# Patient Record
Sex: Female | Born: 1935 | Race: White | Hispanic: No | Marital: Married | Smoking: Never smoker
Health system: Southern US, Academic
[De-identification: ages and names within clinical notes are randomized; demographics above are authoritative.]

## PROBLEM LIST (undated history)

## (undated) DIAGNOSIS — I2510 Atherosclerotic heart disease of native coronary artery without angina pectoris: Secondary | ICD-10-CM

## (undated) DIAGNOSIS — I10 Essential (primary) hypertension: Secondary | ICD-10-CM

## (undated) DIAGNOSIS — Z951 Presence of aortocoronary bypass graft: Secondary | ICD-10-CM

## (undated) DIAGNOSIS — J4489 Other specified chronic obstructive pulmonary disease: Secondary | ICD-10-CM

## (undated) DIAGNOSIS — J449 Chronic obstructive pulmonary disease, unspecified: Secondary | ICD-10-CM

## (undated) DIAGNOSIS — K219 Gastro-esophageal reflux disease without esophagitis: Secondary | ICD-10-CM

## (undated) DIAGNOSIS — E785 Hyperlipidemia, unspecified: Secondary | ICD-10-CM

## (undated) DIAGNOSIS — Z8679 Personal history of other diseases of the circulatory system: Secondary | ICD-10-CM

## (undated) DIAGNOSIS — M129 Arthropathy, unspecified: Secondary | ICD-10-CM

## (undated) HISTORY — DX: Hyperlipidemia, unspecified: E78.5

## (undated) HISTORY — PX: VAGINAL HYSTERECTOMY: SHX2639

## (undated) HISTORY — DX: Other specified chronic obstructive pulmonary disease: J44.89

## (undated) HISTORY — DX: Gastro-esophageal reflux disease without esophagitis: K21.9

## (undated) HISTORY — DX: Essential (primary) hypertension: I10

## (undated) HISTORY — DX: Personal history of other diseases of the circulatory system: Z86.79

## (undated) HISTORY — PX: REPLACEMENT TOTAL KNEE BILATERAL: SUR1225

## (undated) HISTORY — PX: BACK SURGERY: SHX140

## (undated) HISTORY — DX: Chronic obstructive pulmonary disease, unspecified: J44.9

## (undated) HISTORY — DX: Atherosclerotic heart disease of native coronary artery without angina pectoris: I25.10

## (undated) HISTORY — DX: Presence of aortocoronary bypass graft: Z95.1

---

## 1989-11-13 HISTORY — PX: CARDIAC CATHETERIZATION: SHX172

## 1993-11-20 HISTORY — PX: CARDIAC CATHETERIZATION: SHX172

## 1997-09-20 ENCOUNTER — Ambulatory Visit (HOSPITAL_COMMUNITY): Admission: RE | Admit: 1997-09-20 | Discharge: 1997-09-20 | Payer: Self-pay | Admitting: Cardiology

## 1997-09-20 HISTORY — PX: CARDIAC CATHETERIZATION: SHX172

## 1999-06-12 DIAGNOSIS — Z951 Presence of aortocoronary bypass graft: Secondary | ICD-10-CM

## 1999-06-12 HISTORY — DX: Presence of aortocoronary bypass graft: Z95.1

## 2000-01-25 ENCOUNTER — Inpatient Hospital Stay (HOSPITAL_COMMUNITY): Admission: EM | Admit: 2000-01-25 | Discharge: 2000-01-30 | Payer: Self-pay | Admitting: Emergency Medicine

## 2000-01-25 ENCOUNTER — Encounter: Payer: Self-pay | Admitting: Emergency Medicine

## 2000-01-25 HISTORY — PX: CARDIAC CATHETERIZATION: SHX172

## 2000-01-26 ENCOUNTER — Encounter: Payer: Self-pay | Admitting: Thoracic Surgery (Cardiothoracic Vascular Surgery)

## 2000-01-26 HISTORY — PX: CORONARY ARTERY BYPASS GRAFT: SHX141

## 2000-01-27 ENCOUNTER — Encounter: Payer: Self-pay | Admitting: Thoracic Surgery (Cardiothoracic Vascular Surgery)

## 2000-01-28 ENCOUNTER — Encounter: Payer: Self-pay | Admitting: Thoracic Surgery (Cardiothoracic Vascular Surgery)

## 2000-02-26 ENCOUNTER — Encounter: Payer: Self-pay | Admitting: Thoracic Surgery (Cardiothoracic Vascular Surgery)

## 2000-02-26 ENCOUNTER — Encounter
Admission: RE | Admit: 2000-02-26 | Discharge: 2000-02-26 | Payer: Self-pay | Admitting: Thoracic Surgery (Cardiothoracic Vascular Surgery)

## 2000-03-12 ENCOUNTER — Encounter (HOSPITAL_COMMUNITY): Admission: RE | Admit: 2000-03-12 | Discharge: 2000-06-10 | Payer: Self-pay | Admitting: Cardiology

## 2001-02-27 ENCOUNTER — Encounter: Admission: RE | Admit: 2001-02-27 | Discharge: 2001-03-18 | Payer: Self-pay | Admitting: Family Medicine

## 2001-03-27 ENCOUNTER — Encounter: Payer: Self-pay | Admitting: Family Medicine

## 2001-03-27 ENCOUNTER — Encounter: Admission: RE | Admit: 2001-03-27 | Discharge: 2001-03-27 | Payer: Self-pay | Admitting: Family Medicine

## 2001-04-10 ENCOUNTER — Encounter: Admission: RE | Admit: 2001-04-10 | Discharge: 2001-04-10 | Payer: Self-pay | Admitting: Family Medicine

## 2001-04-10 ENCOUNTER — Encounter: Payer: Self-pay | Admitting: Family Medicine

## 2001-04-24 ENCOUNTER — Encounter: Payer: Self-pay | Admitting: Family Medicine

## 2001-04-24 ENCOUNTER — Encounter: Admission: RE | Admit: 2001-04-24 | Discharge: 2001-04-24 | Payer: Self-pay | Admitting: Family Medicine

## 2001-05-29 ENCOUNTER — Encounter: Admission: RE | Admit: 2001-05-29 | Discharge: 2001-05-29 | Payer: Self-pay | Admitting: Family Medicine

## 2001-05-29 ENCOUNTER — Encounter: Payer: Self-pay | Admitting: Family Medicine

## 2001-07-28 ENCOUNTER — Inpatient Hospital Stay (HOSPITAL_COMMUNITY): Admission: RE | Admit: 2001-07-28 | Discharge: 2001-07-30 | Payer: Self-pay | Admitting: Neurosurgery

## 2001-09-02 ENCOUNTER — Encounter: Admission: RE | Admit: 2001-09-02 | Discharge: 2001-09-02 | Payer: Self-pay | Admitting: Neurosurgery

## 2001-10-06 ENCOUNTER — Inpatient Hospital Stay (HOSPITAL_COMMUNITY): Admission: RE | Admit: 2001-10-06 | Discharge: 2001-10-08 | Payer: Self-pay | Admitting: Orthopedic Surgery

## 2001-10-08 ENCOUNTER — Inpatient Hospital Stay (HOSPITAL_COMMUNITY)
Admission: AD | Admit: 2001-10-08 | Discharge: 2001-10-13 | Payer: Self-pay | Admitting: Physical Medicine & Rehabilitation

## 2001-11-10 ENCOUNTER — Encounter: Admission: RE | Admit: 2001-11-10 | Discharge: 2001-12-05 | Payer: Self-pay | Admitting: Orthopedic Surgery

## 2002-05-13 ENCOUNTER — Ambulatory Visit (HOSPITAL_BASED_OUTPATIENT_CLINIC_OR_DEPARTMENT_OTHER): Admission: RE | Admit: 2002-05-13 | Discharge: 2002-05-13 | Payer: Self-pay | Admitting: Orthopedic Surgery

## 2002-06-10 ENCOUNTER — Encounter: Admission: RE | Admit: 2002-06-10 | Discharge: 2002-08-13 | Payer: Self-pay | Admitting: Orthopedic Surgery

## 2002-09-15 ENCOUNTER — Encounter: Admission: RE | Admit: 2002-09-15 | Discharge: 2002-09-15 | Payer: Self-pay | Admitting: Family Medicine

## 2002-09-15 ENCOUNTER — Encounter: Payer: Self-pay | Admitting: Family Medicine

## 2002-09-30 ENCOUNTER — Ambulatory Visit (HOSPITAL_BASED_OUTPATIENT_CLINIC_OR_DEPARTMENT_OTHER): Admission: RE | Admit: 2002-09-30 | Discharge: 2002-09-30 | Payer: Self-pay | Admitting: Orthopedic Surgery

## 2003-03-08 ENCOUNTER — Encounter: Payer: Self-pay | Admitting: Family Medicine

## 2003-03-08 ENCOUNTER — Ambulatory Visit (HOSPITAL_COMMUNITY): Admission: RE | Admit: 2003-03-08 | Discharge: 2003-03-08 | Payer: Self-pay | Admitting: Family Medicine

## 2003-03-17 ENCOUNTER — Encounter: Admission: RE | Admit: 2003-03-17 | Discharge: 2003-05-20 | Payer: Self-pay | Admitting: Family Medicine

## 2003-04-29 ENCOUNTER — Encounter: Admission: RE | Admit: 2003-04-29 | Discharge: 2003-04-29 | Payer: Self-pay | Admitting: Family Medicine

## 2003-07-22 ENCOUNTER — Encounter: Admission: RE | Admit: 2003-07-22 | Discharge: 2003-07-22 | Payer: Self-pay | Admitting: Neurosurgery

## 2003-08-26 ENCOUNTER — Inpatient Hospital Stay (HOSPITAL_COMMUNITY): Admission: RE | Admit: 2003-08-26 | Discharge: 2003-09-02 | Payer: Self-pay | Admitting: Neurosurgery

## 2004-06-15 IMAGING — CR DG CHEST 2V
2 series · 2 of 2 positions shown · non-contrast
Comparison: 07/24/01.

CLINICAL DATA: Preoperative respiratory exam for cervical spondylosis and radiculopathy. 
 TWO VIEW CHEST

[view not recorded (1 of 2)]
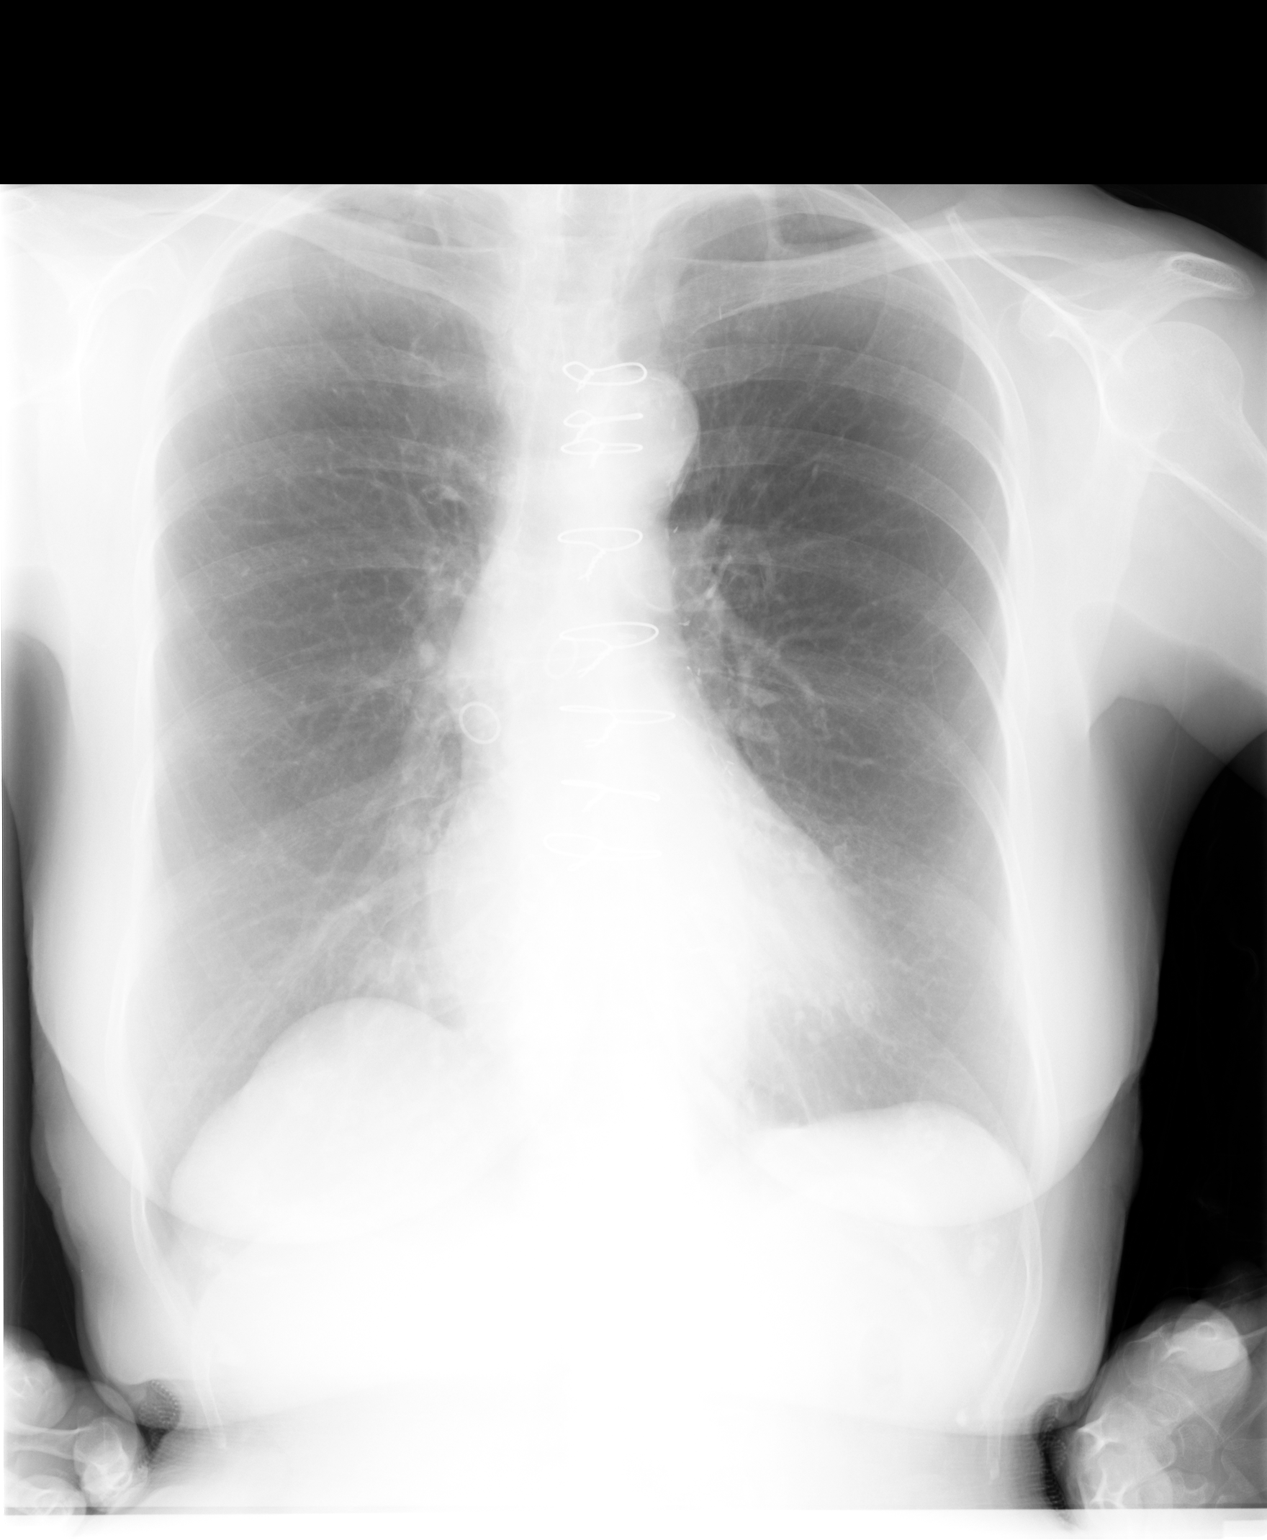

[view not recorded (2 of 2)]
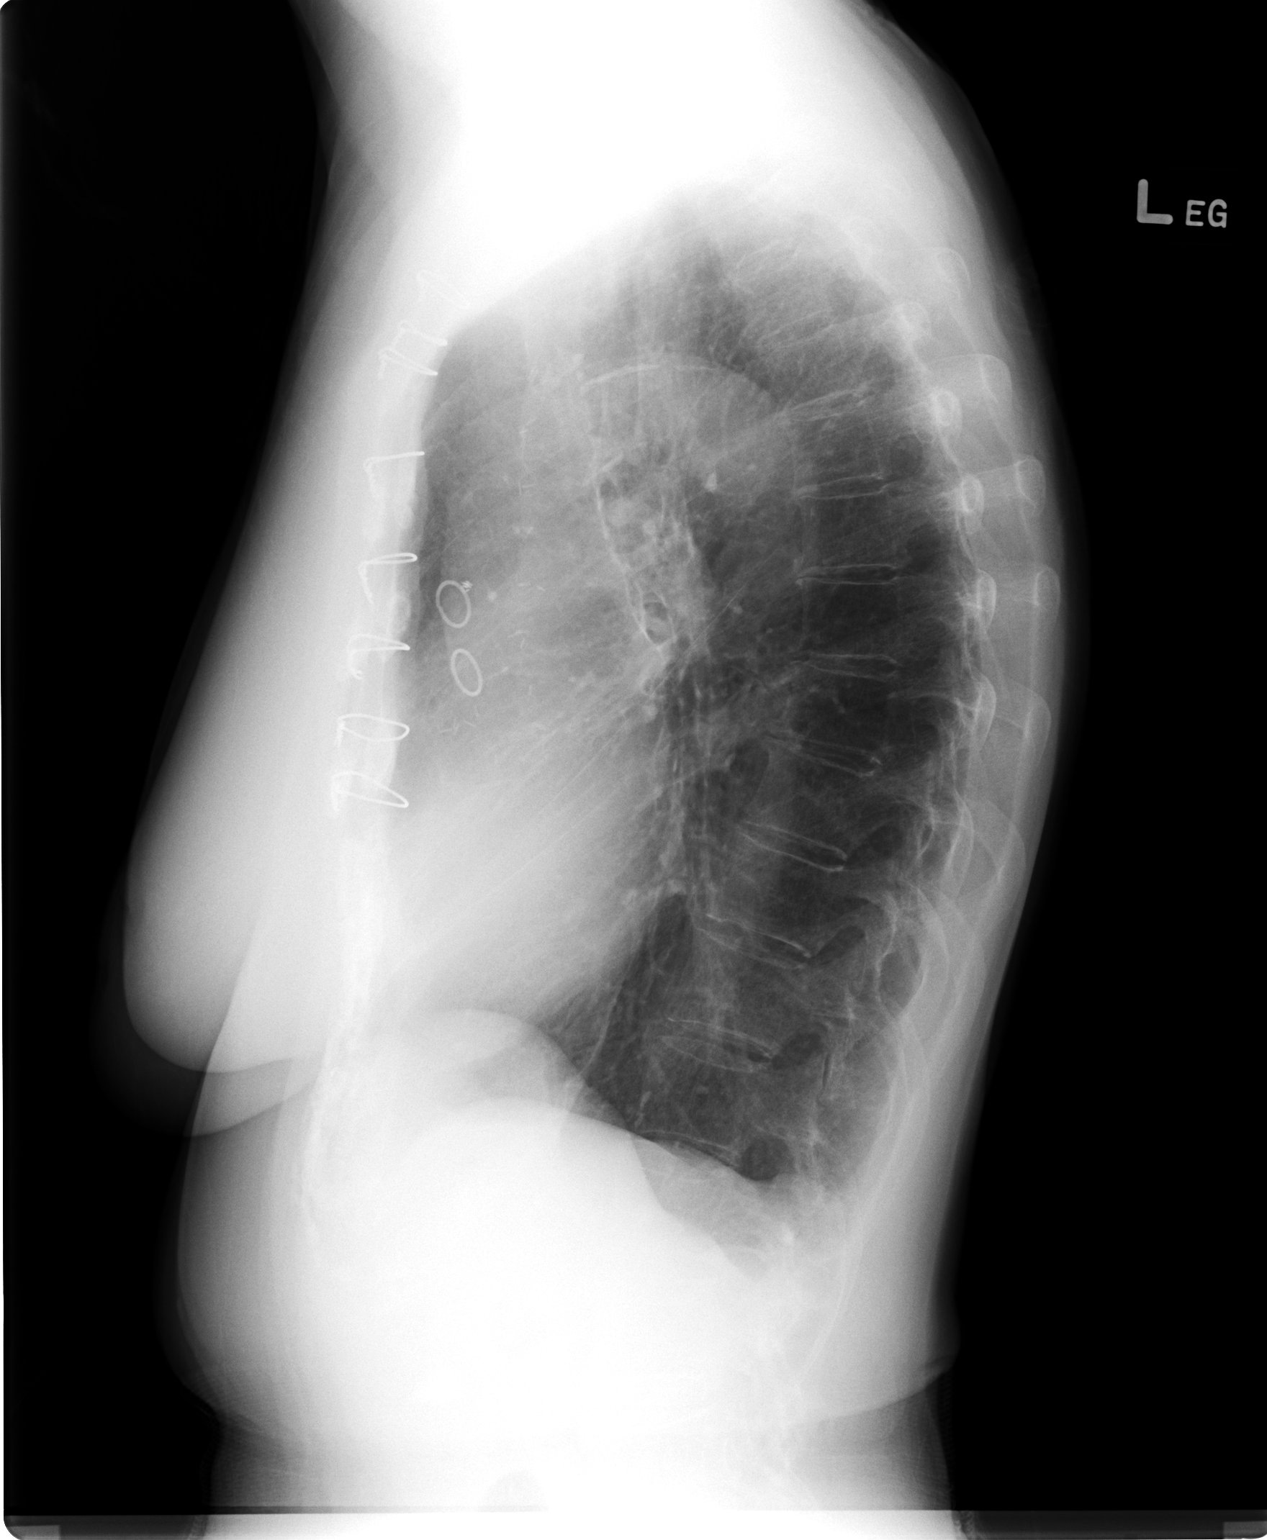

[2 of 2 positions shown; findings below may reference images not displayed]

Stable mild underlying COPD.  No infiltrate, edema, nodule, or pleural effusion.  The heart size is stable.  The patient has had prior CABG.  The bony thorax is unremarkable. 
 IMPRESSION
 No active disease.

## 2004-06-17 IMAGING — RF DG LUMBAR SPINE 2-3V
1 series · 2 of 2 positions shown · non-contrast
Comparison: none

CLINICAL DATA: PLIF L3 to S1.
 INTRAOPERATIVE VIEWS LUMBAR SPINE
 Intraoperative spot image shows skin spreaders posteriorly.  Pedicle screws extend into the L2, L3, L4, L5, and S1 vertebrae, seen in the AP projection.
 INTRAOPERATIVE SPOT IMAGES
 Lateral projection shows pedicle screws extending into the L2, L3, L4, L5, S1 levels.
 LATERAL VIEW LUMBAR SPINE
 The film is suboptimally exposed.  Metallic clips are seen posteriorly.  Superior clip is at the L3 level, the inferior clip is at the 4-5 disc level.
 IMPRESSION 
 As above.
 FLUOROSCOPY
 Fluoroscopy was utilized by the requesting physician.
 IMPRESSION
 No radiographic interpretation.

[Series 1: run · 2 of 2 slices shown]
[im 1/2]
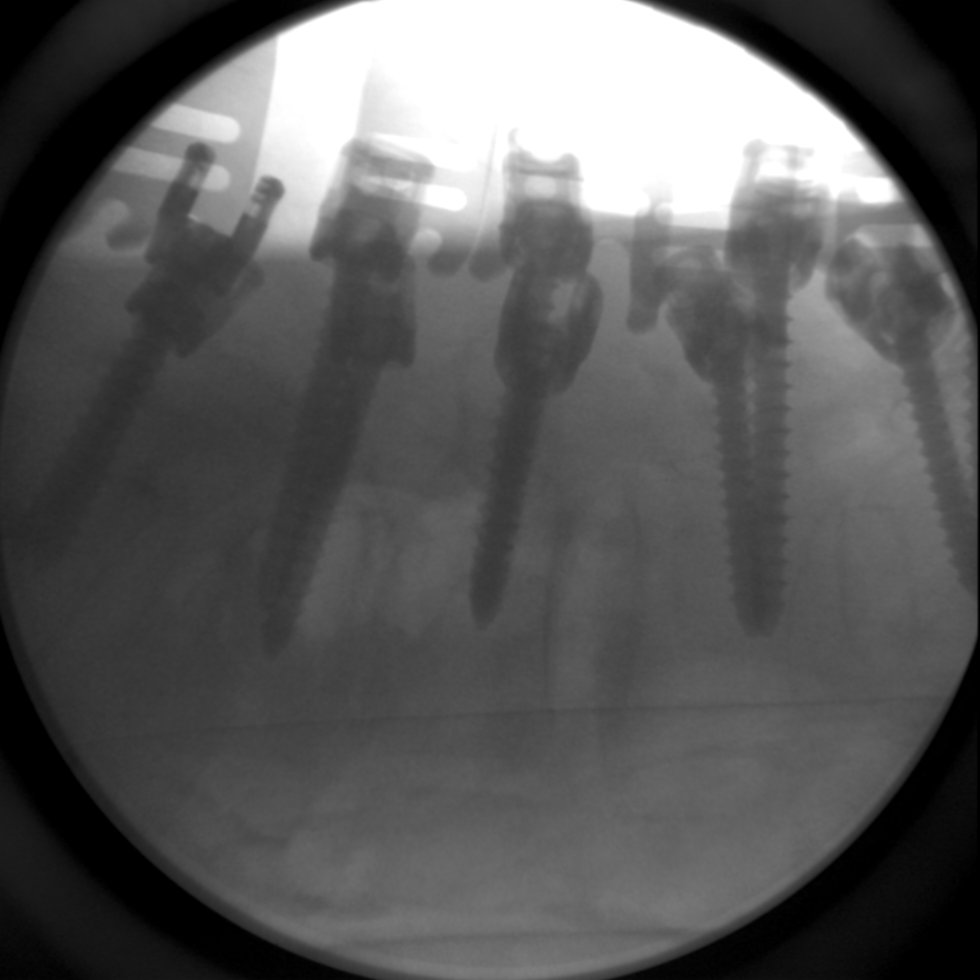
[im 2/2]
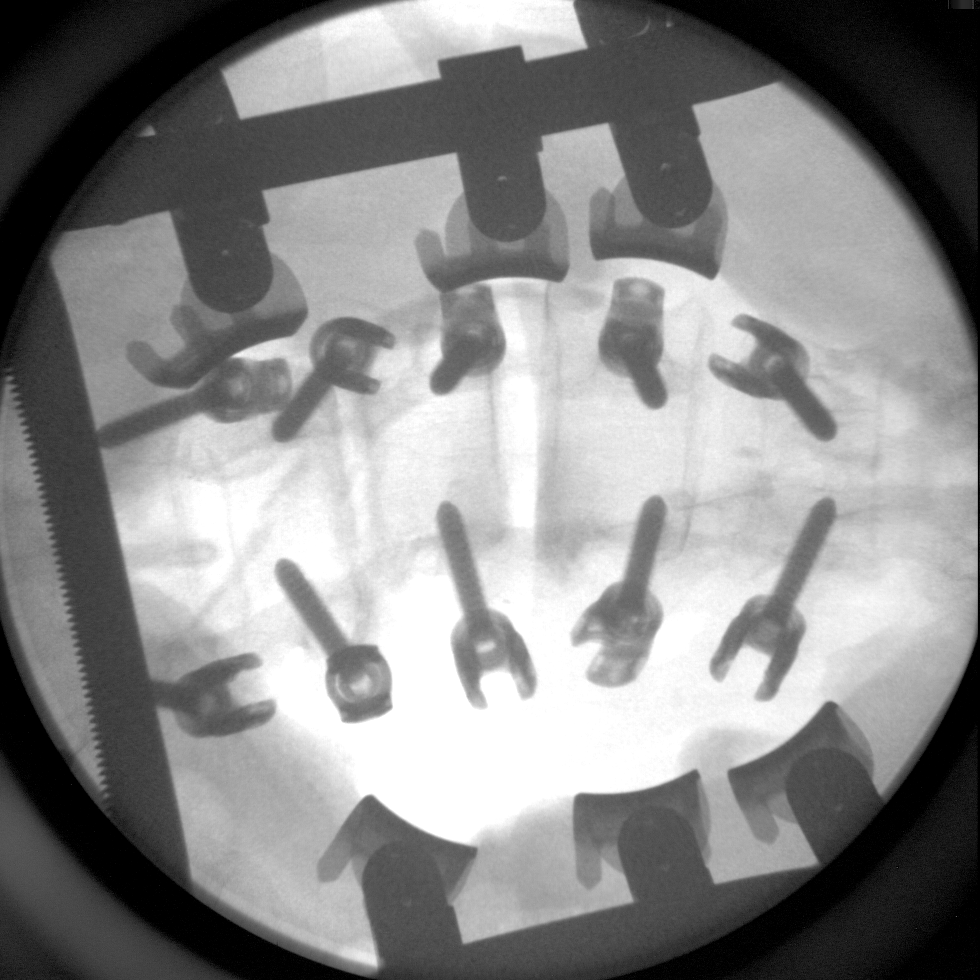

[2 of 2 positions shown; findings below may reference images not displayed]

## 2004-08-17 ENCOUNTER — Encounter: Admission: RE | Admit: 2004-08-17 | Discharge: 2004-08-17 | Payer: Self-pay | Admitting: Cardiology

## 2004-08-24 ENCOUNTER — Ambulatory Visit (HOSPITAL_COMMUNITY): Admission: RE | Admit: 2004-08-24 | Discharge: 2004-08-24 | Payer: Self-pay | Admitting: Cardiology

## 2004-08-24 HISTORY — PX: CARDIAC CATHETERIZATION: SHX172

## 2004-09-04 ENCOUNTER — Encounter: Admission: RE | Admit: 2004-09-04 | Discharge: 2004-09-04 | Payer: Self-pay | Admitting: Cardiology

## 2005-10-09 HISTORY — PX: NM MYOCAR PERF WALL MOTION: HXRAD629

## 2005-11-22 ENCOUNTER — Encounter: Admission: RE | Admit: 2005-11-22 | Discharge: 2005-11-22 | Payer: Self-pay | Admitting: Cardiology

## 2005-11-29 ENCOUNTER — Ambulatory Visit (HOSPITAL_COMMUNITY): Admission: RE | Admit: 2005-11-29 | Discharge: 2005-11-29 | Payer: Self-pay | Admitting: Cardiology

## 2005-11-29 HISTORY — PX: CARDIAC CATHETERIZATION: SHX172

## 2006-07-29 ENCOUNTER — Inpatient Hospital Stay (HOSPITAL_COMMUNITY): Admission: RE | Admit: 2006-07-29 | Discharge: 2006-07-31 | Payer: Self-pay | Admitting: Orthopedic Surgery

## 2006-08-19 ENCOUNTER — Encounter: Admission: RE | Admit: 2006-08-19 | Discharge: 2006-11-17 | Payer: Self-pay | Admitting: Orthopedic Surgery

## 2007-02-27 ENCOUNTER — Ambulatory Visit (HOSPITAL_COMMUNITY): Admission: RE | Admit: 2007-02-27 | Discharge: 2007-02-27 | Payer: Self-pay | Admitting: Cardiology

## 2007-05-05 ENCOUNTER — Ambulatory Visit: Payer: Self-pay | Admitting: Internal Medicine

## 2007-05-16 ENCOUNTER — Ambulatory Visit: Payer: Self-pay | Admitting: Internal Medicine

## 2007-07-03 DIAGNOSIS — J449 Chronic obstructive pulmonary disease, unspecified: Secondary | ICD-10-CM

## 2007-07-04 ENCOUNTER — Ambulatory Visit: Payer: Self-pay | Admitting: Internal Medicine

## 2007-07-06 DIAGNOSIS — I2510 Atherosclerotic heart disease of native coronary artery without angina pectoris: Secondary | ICD-10-CM | POA: Insufficient documentation

## 2007-07-28 ENCOUNTER — Telehealth (INDEPENDENT_AMBULATORY_CARE_PROVIDER_SITE_OTHER): Payer: Self-pay | Admitting: *Deleted

## 2007-10-30 ENCOUNTER — Ambulatory Visit: Payer: Self-pay | Admitting: Internal Medicine

## 2008-04-30 ENCOUNTER — Ambulatory Visit: Payer: Self-pay | Admitting: Internal Medicine

## 2008-04-30 DIAGNOSIS — K219 Gastro-esophageal reflux disease without esophagitis: Secondary | ICD-10-CM | POA: Insufficient documentation

## 2008-08-30 ENCOUNTER — Ambulatory Visit: Payer: Self-pay | Admitting: Internal Medicine

## 2008-09-09 ENCOUNTER — Ambulatory Visit: Payer: Self-pay | Admitting: Internal Medicine

## 2008-11-23 ENCOUNTER — Ambulatory Visit: Payer: Self-pay | Admitting: Internal Medicine

## 2008-12-03 ENCOUNTER — Telehealth: Payer: Self-pay | Admitting: Internal Medicine

## 2009-03-03 ENCOUNTER — Encounter: Payer: Self-pay | Admitting: Internal Medicine

## 2009-03-24 ENCOUNTER — Ambulatory Visit: Payer: Self-pay | Admitting: Internal Medicine

## 2009-09-20 ENCOUNTER — Ambulatory Visit: Payer: Self-pay | Admitting: Internal Medicine

## 2010-02-16 ENCOUNTER — Encounter: Payer: Self-pay | Admitting: Internal Medicine

## 2010-02-20 ENCOUNTER — Encounter: Admission: RE | Admit: 2010-02-20 | Discharge: 2010-02-20 | Payer: Self-pay | Admitting: Cardiology

## 2010-02-20 ENCOUNTER — Encounter: Payer: Self-pay | Admitting: Internal Medicine

## 2010-03-21 ENCOUNTER — Ambulatory Visit: Payer: Self-pay | Admitting: Internal Medicine

## 2010-05-12 ENCOUNTER — Ambulatory Visit (HOSPITAL_COMMUNITY)
Admission: RE | Admit: 2010-05-12 | Discharge: 2010-05-12 | Payer: Self-pay | Source: Home / Self Care | Admitting: Gastroenterology

## 2010-07-13 NOTE — Assessment & Plan Note (Signed)
Summary: rov 6 months///kp   Copy to:  Al. Little Primary Provider/Referring Provider:  Arrie Aran  CC:  Follow up visit.  History of Present Illness:  08/30/08- COPD Coughs alot on arising in AM. Was wheezing and her cardiologist gave her Ventolin HFA which helps her cough some. Square dances on Monday nights and this or fast walking make her SOB. Usually she can dance as much as she wants. PFT 12/08 showed obstruction in small airways. Dry cough only. Denies chest pain, palitation or leg edema. Spiriva overdried her mouth.  11/23/08- COPD Summer heat bothers her breathing some. No recent infection. Frequent dry cough, esp in AM but doesn't wake her. Stays dyspneic walking hills but walks her dog and still goes square dancing. Asks Rx for  cough. Spiriva overdried.  CXR- COPD, basilar scarring, NAD. PFT- mild small airway obstruction without response to dilator, diffusinon mildly reduced.  March 24, 2009- COPD  using Advair but rarely needs rescue inhaler. Dry cough on waking for an hour or so. Sleep not disturbed. Some dyspnea with exertion. Takes square dancing once a week.  September 20, 2009- COPD Spring isn't bothering her breathing yet. Continues Advair but rarely needs her rescue inhaler. Room air sat today is 93%. Has daughter with 40 pack year smoking hx now dx'd with COPD. We reviewed her PFT which fits mild emphysema. Little cough, rarely productive. Known hx of CAD.   Current Medications (verified): 1)  Vytorin 10-40 Mg Tabs (Ezetimibe-Simvastatin) .... Take 1 By Mouth Once Daily 2)  Synthroid 125 Mcg  Tabs (Levothyroxine Sodium) .... Take 1 Tablet By Mouth Once A Day 3)  Ranexa 500 Mg  Tb12 (Ranolazine) .Marland Kitchen.. 1 By Mouth Two Times A Day 4)  Glucosamine Chondr 1500 Complx   Caps (Glucosamine-Chondroit-Vit C-Mn) .... Take 1 By Mouth Once Daily 5)  Centrum Silver   Tabs (Multiple Vitamins-Minerals) .... Take 1 By Mouth Once Daily 6)  Ocuvite-Lutein   Caps (Multiple  Vitamins-Minerals) .... Take 2 By Mouth Once Daily 7)  Adult Aspirin Low Strength 81 Mg  Tbdp (Aspirin) .... Take 1 By Mouth Once Daily 8)  Prilosec Otc 20 Mg Tbec (Omeprazole Magnesium) .Marland Kitchen.. 1 Daily Before Supper 9)  Ventolin Hfa 108 (90 Base) Mcg/act Aers (Albuterol Sulfate) .... 2 Puffs Four Times A Day As Needed 10)  Benzonatate 100 Mg Caps (Benzonatate) .Marland Kitchen.. 1 Four Times A Day As Needed Cough 11)  Advair Diskus 100-50 Mcg/dose Misc (Fluticasone-Salmeterol) .Marland Kitchen.. 1 Puff Two Times A Day and Rinse Mouth  Allergies (verified): 1)  ! Penicillin 2)  ! Sulfa  Past History:  Past Medical History: Last updated: 04/30/2008 Coronary Heart Disease/ angina/ CABG  COPD (ICD-496) GERD (ICD-530.81)  Past Surgical History: Last updated: 07/04/2007 CABG Nov 06, 1999 back sgy hysterectomy bilateral knee replacement  Family History: Last updated: 09/20/2009 Brother with COPD with COPD mother died in 1991-11-06 from heart failure father died in 11-05-53 from heart problems 3 siblings 1 brother alive age 14  hx of MI 1 sister alive age 21  hx of MI i brother died age 73 from heart attack Daughter COPD, smoked.  Social History: Last updated: 11/12/2007 Patient states former smoker.-quit 11/06/1999 retired widowed 3 children etoh--1 drink per week  Risk Factors: Smoking Status: quit (07/04/2007)  Family History: Brother with COPD with COPD mother died in 1991-11-06 from heart failure father died in 11-05-53 from heart problems 3 siblings 1 brother alive age 24  hx of MI 1 sister alive age 59  hx of  MI i brother died age 60 from heart attack Daughter COPD, smoked.  Review of Systems      See HPI       The patient complains of dyspnea on exertion.  The patient denies anorexia, fever, weight loss, weight gain, vision loss, decreased hearing, hoarseness, chest pain, syncope, peripheral edema, prolonged cough, headaches, hemoptysis, and severe indigestion/heartburn.    Vital Signs:  Patient profile:   77  year old female Height:      60 inches Weight:      104 pounds BMI:     20.38 O2 Sat:      93 % on Room air Pulse rate:   59 / minute BP sitting:   118 / 66  (left arm) Cuff size:   regular  Vitals Entered By: Reynaldo Minium CMA (September 20, 2009 2:52 PM)  O2 Flow:  Room air  Physical Exam  Additional Exam:  General: A/Ox3; pleasant and cooperative, NAD, SKIN: no rash, lesions NODES: no lymphadenopathy HEENT: [place]/AT, EOM- WNL, Conjuctivae- clear, PERRLA, TM-WNL, Nose- clear, Throat- clear and wnl NECK: Supple w/ fair ROM, JVD- bilaterally, normal carotid impulses w/o bruits Thyroid- normal to palpation CHEST: Distant, unlabored, diminished,  HEART: RRR, slow, no m/g/r heard ABDOMEN: Soft and nl; HKV:QQVZ, nl pulses, no edema  NEURO: Grossly intact to observation      Impression & Recommendations:  Problem # 1:  COPD (ICD-496) Well controlled emphysema pattern. Extensive smoking hx throughout family. No early onset lung disease to raise concern for a1AT. She is encouraged to continue square dancing for exercise.  Medications Added to Medication List This Visit: 1)  Vytorin 10-40 Mg Tabs (Ezetimibe-simvastatin) .... Take 1 by mouth once daily  Other Orders: Est. Patient Level III (56387)  Patient Instructions: 1)  Please schedule a follow-up appointment in 6 months. 2)  Call sooner if needed

## 2010-07-13 NOTE — Letter (Signed)
Summary: [facility]  [facility]   Imported By: Sherian Rein 03/17/2010 14:22:15  _____________________________________________________________________  External Attachment:    Type:   Image     Comment:   External Document

## 2010-07-13 NOTE — Assessment & Plan Note (Signed)
Summary: rov 6 months///kp   Copy to:  Al. Little Primary Provider/Referring Provider:  [facility] Francesco Runner  CC:  6 month COPD follow up.  Pt states breathing is doing well overall.  Occ nonprod cough.  Denies wheezing, chest tightness, and SOB.  [name]  History of Present Illness: 11/23/08- COPD Summer heat bothers her breathing some. No recent infection. Frequent dry cough, esp in AM but doesn't wake her. Stays dyspneic walking hills but walks her dog and still goes square dancing. Asks Rx for  cough. Spiriva overdried.  CXR- COPD, basilar scarring, NAD. PFT- mild small airway obstruction without response to dilator, diffusinon mildly reduced.  March 24, 2009- COPD  using Advair but rarely needs rescue inhaler. Dry cough on waking for an hour or so. Sleep not disturbed. Some dyspnea with exertion. Takes square dancing once a week.  September 20, 2009- COPD Spring isn't bothering her breathing yet. Continues Advair but rarely needs her rescue inhaler. Room air sat today is 93%. Has daughter with 40 pack year smoking hx now dx'd with COPD. We reviewed her PFT which fits mild emphysema. Little cough, rarely productive. Known hx of CAD.  March 21, 2010- COPD, CAD cc: Nurse- 6 month COPD follow up.  Pt states breathing is doing well overall.  Occ nonprod cough.  Denies wheezing, chest tightness, SOB.  Cooler weather is not bothering her.  She rarely coughs and denies wheeze. Discussed cardiac hx. Occasional random pains across lower rib edges, nonexertional, but no angina or palpitation. Has not needed to use Ventolin.  Had pneumovax here several years ago,    Preventive Screening-Counseling & Management  Alcohol-Tobacco     Smoking Status: quit     Packs/Day: 2.0     Year Started: 10-23-73     Year Quit: 10/24/1999     Pack years: 63  Current Medications (verified): 1)  Vytorin 10-40 Mg Tabs (Ezetimibe-Simvastatin) .... Take 1 By Mouth Once Daily 2)  Synthroid 125 Mcg  Tabs (Levothyroxine  Sodium) .... Take 1 Tablet By Mouth Once A Day 3)  Glucosamine Chondr 1500 Complx   Caps (Glucosamine-Chondroit-Vit C-Mn) .... Take 1 By Mouth Once Daily 4)  Centrum Silver   Tabs (Multiple Vitamins-Minerals) .... Take 1 By Mouth Once Daily 5)  Ocuvite-Lutein   Caps (Multiple Vitamins-Minerals) .... Take 2 By Mouth Once Daily 6)  Adult Aspirin Low Strength 81 Mg  Tbdp (Aspirin) .... Take 1 By Mouth Once Daily 7)  Ventolin Hfa 108 (90 Base) Mcg/act Aers (Albuterol Sulfate) .... 2 Puffs Four Times A Day As Needed 8)  Benzonatate 100 Mg Caps (Benzonatate) .[name].. 1 Four Times A Day As Needed Cough 9)  Advair Diskus 100-50 Mcg/dose Misc (Fluticasone-Salmeterol) .[name].. 1 Puff Two Times A Day and Rinse Mouth  Allergies (verified): 1)  ! Penicillin 2)  ! Sulfa 3)  Codeine  Past History:  Past Medical History: Last updated: 04/30/2008 Coronary Heart Disease/ angina/ CABG  COPD (ICD-496) GERD (ICD-530.81)  Family History: Last updated: 03/21/2010 Brother with COPD Sister with COPD mother died in Oct 24, 1991 from heart failure father died in Oct 23, 1953 from heart problems 3 siblings 1 brother alive age 55  hx of MI 1 sister alive age 33  hx of MI i brother died age 89 from heart attack Daughter COPD, smoked.  Social History: Last updated: 03/21/2010 Patient states former smoker.-quit Oct 24, 1999.  2 ppd x 25 yrs. retired widowed 3 children etoh--1 drink per week  Risk Factors: Smoking Status: quit (03/21/2010) Packs/Day: 2.0 (03/21/2010)  Past Surgical History: CABG 4v 2001 back sgy hysterectomy bilateral knee replacement  Family History: Brother with COPD Sister with COPD mother died in 85 from heart failure father died in 85 from heart problems 3 siblings 1 brother alive age 20  hx of MI 1 sister alive age 60  hx of MI i brother died age 31 from heart attack Daughter COPD, smoked.  Social History: Patient states former smoker.-quit 2001.  2 ppd x 25 yrs. retired widowed 3  children etoh--1 drink per week Packs/Day:  2.0 Pack years:  50  Review of Systems      See HPI       The patient complains of shortness of breath with activity.  The patient denies shortness of breath at rest, productive cough, non-productive cough, coughing up blood, chest pain, irregular heartbeats, acid heartburn, indigestion, loss of appetite, weight change, abdominal pain, difficulty swallowing, sore throat, tooth/dental problems, headaches, nasal congestion/difficulty breathing through nose, sneezing, itching, hand/feet swelling, and rash.    Vital Signs:  Patient profile:   77 year old female Height:      60 inches Weight:      101.25 pounds BMI:     19.85 O2 Sat:      94 % on Room air Pulse rate:   65 / minute BP sitting:   128 / 66  (right arm) Cuff size:   regular  Vitals Entered By: Gweneth Dimitri RN (March 21, 2010 2:03 PM)  O2 Flow:  Room air CC: 6 month COPD follow up.  Pt states breathing is doing well overall.  Occ nonprod cough.  Denies wheezing, chest tightness, SOB.   Comments Medications reviewed with patient Daytime contact number verified with patient. Gweneth Dimitri RN  March 21, 2010 2:03 PM    Physical Exam  Additional Exam:  General: A/Ox3; pleasant and cooperative, NAD, SKIN: no rash, lesions NODES: no lymphadenopathy HEENT: [place]/AT, EOM- WNL, Conjuctivae- clear, PERRLA, TM-WNL, Nose- clear, Throat- clear and wnl NECK: Supple w/ fair ROM, JVD- bilaterally, normal carotid impulses w/o bruits Thyroid- normal to palpation CHEST: Distant, unlabored, diminished,  HEART: RRR, slow, no m/g/r heard ABDOMEN: Soft and nl; UVO:ZDGU, nl pulses, no edema  NEURO: Grossly intact to observation      Impression & Recommendations:  Problem # 1:  COPD (ICD-496) Clinically stable COPD. She does square dancing which is excellent for mental status and exercise. Discussed flu and pneumovax. Will give flu vax this year.  Dr Clarene Duke got a CXR "ok" last month- we  will get that report. PFT in 2010 showed mild obstruction and DLCO reduction.   Problem # 2:  CORONARY HEART DISEASE (ICD-414.00) Following with Dr Clarene Duke. She reports no new problems. The following medications were removed from the medication list:    Ranexa 500 Mg Tb12 (Ranolazine) .[name]... 1 by mouth two times a day Her updated medication list for this problem includes:    Adult Aspirin Low Strength 81 Mg Tbdp (Aspirin) .[name]... Take 1 by mouth once daily  Problem # 3:  GERD (ICD-530.81) We reviewed reflux precautions. The following medications were removed from the medication list:    Prilosec Otc 20 Mg Tbec (Omeprazole magnesium) .[name]... 1 daily before supper  Other Orders: Est. Patient Level IV (44034) Flu Vaccine 54yrs + MEDICARE PATIENTS (V4259) Administration Flu vaccine - MCR (D6387)  Patient Instructions: 1)  Please schedule a follow-up appointment in 6 months. 2)  We will get result of CXR from last month for your record 3)  Flu vax 4)  cc Dr Clarene Duke           Flu Vaccine Consent Questions     Do you have a history of severe allergic reactions to this vaccine? no    Any prior history of allergic reactions to egg and/or gelatin? no    Do you have a sensitivity to the preservative Thimersol? no    Do you have a past history of Guillan-Barre Syndrome? no    Do you currently have an acute febrile illness? no    Have you ever had a severe reaction to latex? no    Vaccine information given and explained to patient? yes    Are you currently pregnant? no    Lot Number:[id number]   Exp Date:12/09/2010   Site Given  right Deltoid IMflu  Carver Fila  March 21, 2010 2:59 PM

## 2010-09-18 ENCOUNTER — Encounter: Payer: Self-pay | Admitting: Internal Medicine

## 2010-09-19 ENCOUNTER — Encounter: Payer: Self-pay | Admitting: Internal Medicine

## 2010-09-19 ENCOUNTER — Telehealth: Payer: Self-pay | Admitting: Pulmonary Disease

## 2010-09-19 ENCOUNTER — Ambulatory Visit (INDEPENDENT_AMBULATORY_CARE_PROVIDER_SITE_OTHER): Payer: Medicare Other | Admitting: Internal Medicine

## 2010-09-19 VITALS — BP 120/78 | HR 61 | Ht 60.0 in | Wt 102.0 lb

## 2010-09-19 DIAGNOSIS — J449 Chronic obstructive pulmonary disease, unspecified: Secondary | ICD-10-CM

## 2010-09-19 NOTE — Patient Instructions (Signed)
Continue present meds. You seem to be doing very well, and I am particularly pleased with the regular dancing.  Please call as needed.

## 2010-09-19 NOTE — Progress Notes (Signed)
  Subjective:    Patient ID: [name], female    DOB: 10/28/1924, 77 y.o.   MRN: [id number]  HPI 53 yoF former smoker followed for COPD with hx CAD and GERD. Last here March 21, 2010 and got through winter without bad problems. Denies any symptoms related to the current pollen season. She comments that she only notes dyspnea if she is square dancing each week. She does not see a change in exercise tolerance there. Occasional dry morning cough. Denies fever, sweats, glands, chest pain or palpitation. She continues Advair 100 twice daily and almost never needs her rescue inhaler.    Review of Systems See HPI   Constitutional:   No weight loss, night sweats,  Fevers, chills, fatigue, lassitude. HEENT:   No headaches,  Difficulty swallowing,  Tooth/dental problems,  Sore throat,                No sneezing, itching, ear ache, nasal congestion, post nasal drip,   CV:  No chest pain,  Orthopnea, PND, swelling in lower extremities, anasarca, dizziness, palpitations  GI  No heartburn, indigestion, abdominal pain, nausea, vomiting, diarrhea, change in bowel habits, loss of appetite  Resp:  shortness of breath with exertion, not at rest.  No excess mucus, no productive cough,  No non-productive cough,  No coughing up of blood.  No change in color of mucus.  No wheezing.  No chest wall deformity  Skin: no rash or lesions.  GU: no dysuria, change in color of urine, no urgency or frequency.  No flank pain.  MS:  No joint pain or swelling.  No decreased range of motion.  No back pain.  Psych:  No change in mood or affect. No depression or anxiety.  No memory loss.   Objective:   Physical Exam General- Alert, Oriented, Affect-appropriate, Distress- none acute. Slim elderly woman.  Skin- rash-none, lesions- none, excoriation- none  Lymphadenopathy- none  Head- atraumatic  Eyes- Gross vision intact, PERRLA, conjunctivae clear secretions  Ears-  Normal- Hearing, canals Tm L ,   R  ,  Nose- Clear, No- Septal dev, mucus, polyps, erosion, perforation   Throat- Mallampati II , mucosa clear , drainage- none, tonsils- atrophic  Neck- flexible , trachea midline, no stridor , thyroid nl, carotid no bruit  Chest - symmetrical excursion , unlabored     Heart/CV- RRR , no murmur , no gallop  , no rub, nl s1 s2                     - JVD- none , edema- none, stasis changes- none, varices- none     Lung- clear to P&A, wheeze- none, cough- none , dullness-none, rub- none     Chest wall-  Abd- tender-no, distended-no, bowel sounds-present, HSM- no  Br/ Gen/ Rectal- Not done, not indicated  Extrem- cyanosis- none, clubbing, none, atrophy- none, strength- nl  Neuro- grossly intact to observation         Assessment & Plan:

## 2010-09-19 NOTE — Telephone Encounter (Signed)
Error. No msg needed. Kathleen Perdue  °

## 2010-09-19 NOTE — Assessment & Plan Note (Signed)
I am pleased with clinical stability and with her regular dancing for exercise. She had a CXR last Fall and i hear no reason to repeat at this time, or to change meds.

## 2010-09-22 ENCOUNTER — Encounter: Payer: Self-pay | Admitting: Internal Medicine

## 2010-10-24 NOTE — Assessment & Plan Note (Signed)
[facility]                             PULMONARY OFFICE NOTE   NAME:[name], [name]                    MRN:          [id number]  DATE:05/05/2007                            DOB:          [birth year]    PROBLEM:  This is an 77 year old woman seen in pulmonary consultation at  the kind request of Dr. Clarene Duke for history of chronic obstructive  pulmonary disease, complaining that she does not breathe well.   HISTORY:  Her sister had been a patient of mine. [name] says that  she was diagnosed with chronic obstructive pulmonary disease two months  ago having quit smoking in 2001. A pulmonary function test at [facility]  on 02/27/2007 showed normal airflow with response to bronchodilator.  Baseline FEV1/FVC was 66%. Small airway flows were at 33% of predicted  improving significantly to 57% of predicted after bronchodilator as the  basis for an interpretation of mild obstructive airways disease,  primarily involving small airways, but with some response to  bronchodilator even in the larger airway flows. Diffusion and lung  volumes were not measured. She had a pneumonia treated as an outpatient  in 2006. She notices mostly exertional dyspnea, especially in the last 6  to 7 months, and especially climbing hills or stairs. There has been a  [name] cough. She considers that she gets colds and respiratory  infections only rarely. It is not clear when she last had a chest x-ray.   MEDICATIONS:  1. Lipitor 40 mg.  2. Synthroid.  3. Toprol.  4. Xopenex HFA inhaler 2 puffs t.i.d. p.r.n.   Drug intolerant PENICILLIN and SULFA.   REVIEW OF SYSTEMS:  Weight has been stable. Exertional dyspnea and non-  productive cough per HPI. Nasal congestion. Joint stiffness. She says  that her legs get stiff and it is hard to move from a sitting position  sometimes, but she denies claudication, exertional chest pain,  palpitation, productive cough, fever, blood, or  swollen glands.   PAST HISTORY:  Angina and coronary artery disease with bypass graft in  2001. Hyperlipidemia. She denies history of asthma or seasonal allergic  rhinitis. She has had back surgery, hysterectomy, bilateral knee  replacements.   SOCIAL HISTORY:  She quit smoking in 2001. She is widowed with three  children. Her sister had chronic obstructive pulmonary disease. She  lives alone. She is retired from the [employer]. She  recently went on a cruise through [place].   FAMILY HISTORY:  Chronic obstructive pulmonary disease, heart disease.   OBJECTIVE:  Weight 114 pounds, blood pressure 116/78, pulse regular 78,  room air saturation 97%. Eyes, nose, and throat are clear. Neck veins  are not distended. Voice quality is normal without strider.  CHEST:  Diminished breath sounds with trace and expiratory wheeze  bilaterally. No rales or rhonchi, and no coughing here.  HEART:  Sounds are regular. I do not hear a murmur or gallop.  ABDOMEN:  Soft without hepatosplenomegaly.  EXTREMITIES:  Without cyanosis, clubbing, or edema.   VACCINE STATUS:  She has avoided flu vaccine in the past,  but agreed to  try it this year, along with pneumococcal vaccine after discussion.   IMPRESSION:  1. Dyspnea, relatively recently noted by her description and primarily      associated with exertion. There is a significant smoking history.      PFTs show mild partly reversible airway obstruction, particularly      in small airway flows. We will want to reassess that for stability      and to see how closely it represents her baseline status.  2. Atherosclerosis with history of coronary disease, bypass graft, and      at least some degree of peripheral vascular disease.   PLAN:  1. Pulmonary function test with spirometry and six minute walk test.  2. Chest x-ray.  3. Flu and pneumococcal vaccines.  4. She will continue using Xopenex on a p.r.n. rescue inhaler basis as       discussed with her, scheduling return in two months or earlier      p.r.n.   I appreciate the chance to meet her.     Clinton D. Maple Hudson, MD, Tonny Bollman, FACP  Electronically Signed    CDY/MedQ  DD: 05/10/2007  DT: 05/11/2007  Job #: [id number]   cc:   [name], M.D.  [name], M.D.

## 2010-10-27 NOTE — Discharge Summary (Signed)
[place]. [facility]  Patient:    [name], [name] Visit Number: [id number] MRN: [id number]          Service Type: [facility] Location: [id number] Attending Physician:  Herold Harms Dictated by:   Mcarthur Rossetti. Angiulli, P.A. Admit Date:  10/08/2001 Discharge Date: 10/13/2001   CC:         Georgena Spurling, M.D.  Lacretia Leigh. Quintella Reichert, M.D.  Julieanne Manson, M.D.   Discharge Summary  DISCHARGE DIAGNOSES: 1. Left total knee arthroplasty secondary to degenerative joint disease    on October 06, 2001. 2. Anemia. 3. Coronary artery disease with coronary artery bypass grafting. 4. Back surgery. 5. Hypercholesterolemia.  HISTORY OF PRESENT ILLNESS: This is a 77 year old white female with a history of hypertension, coronary artery disease, admitted on October 06, 2001 with progressive left knee pain times six years. X-rays with severe medial compartment collapse. No relief with conservative care. Underwent left total knee arthroplasty on October 06, 2001 by Dr. Sherlean Foot. Placed on subcutaneous Lovenox for deep vein thrombosis prophylaxis and weight bearing as tolerated. Postoperative anemia and transfused. No chest pain or shortness of breath. Minimal assistance for ambulation. Admitted for a comprehensive rehab program.  PAST MEDICAL HISTORY: See discharge diagnoses.  PAST SURGICAL HISTORY: Cervical disk surgery 20 years ago. Lumbar surgery. Partial hysterectomy.  ALLERGIES: PENICILLIN AND SULFA.  PRIMARY CARE PHYSICIAN: [name] Rossetti, M.D.  CARDIOLOGIST: Julieanne Manson, M.D.  SOCIAL HISTORY: Remote smoker. No alcohol.  MEDICATIONS: 1. Lipitor 10 mg daily. 2. Mobic 15 mg one half tablet daily. 3. Acuvite daily. 4. Aspirin daily.  FAMILY HISTORY: Recently widowed. Lives alone. One level home. One step to entry. Retired [employer] operational officer. No local family.  HOSPITAL COURSE: The patient did well while on rehabilitation services with therapies initiated on a b.i.d.  basis. The following issues are followed during the patients rehab course. Pertaining to Ms. Rathe left total knee arthroplasty, remained stable. Surgical site healing nicely. She would follow-up with Dr. Sherlean Foot in one week for removal of staples. She will still need an advancement in her CPM machine. This would be arranged with home CPM machine. It was advised the need for home health therapies and follow-up again with Dr. Sherlean Foot with total knee precautions. She was maintained on SQ Lovenox throughout her rehab stay. Venous Doppler studies prior to her discharge were negative. Lovenox was discontinued and aspirin resumed for her history of coronary artery disease. Postoperative anemia with transfusion. Latest hemoglobin 10.5 and hematocrit 30.4. She continued on iron supplement. She had no chest pain or shortness of breath. Blood pressures remained controlled without the use of medications. She had no voiding difficulty and bowel program was regulated with laxative assistance. Overall for her functional mobility, she was ambulating extended distances with a walker, independent in her room. Home health therapies had been arranged. She would follow-up with orthopaedic services.  LABORATORY DATA: Latest labs showed a sodium of 144, potassium 3.7, BUN 15, creatinine 0.7, hemoglobin 10.5, hematocrit 30.4.  DISCHARGE MEDICATIONS: 1. Lipitor 10 mg daily. 2. Acuvite daily. 3. Trinsicon b.i.d. 4. Vioxx 25 mg daily. 5. Aspirin daily. 6. Tylox as needed for pain.  ACTIVITY: Weight bearing as tolerated with total knee precautions and CPM machine for home.  DIET: Regular.  WOUND CARE: Follow-up with Dr. Sherlean Foot in one week for removal of staples. Follow-up with Dr. [name] Rossetti for medical management. Dictated by:   Mcarthur Rossetti. Angiulli, P.A. Attending Physician:  Herold Harms DD:  10/13/01 TD:  10/15/01 Job: [id number]

## 2010-10-27 NOTE — Op Note (Signed)
NAME:  [name], [name]                       ACCOUNT NO.:  [id number]   MEDICAL RECORD NO.:  [id number]                   PATIENT TYPE:  AMB   LOCATION:  DSC                                  FACILITY:  [facility]   PHYSICIAN:  Artist Pais. Mina Marble, M.D.           DATE OF BIRTH:  05/24/1925   DATE OF PROCEDURE:  09/30/2002  DATE OF DISCHARGE:                                 OPERATIVE REPORT   PREOPERATIVE DIAGNOSIS:  Left carpal tunnel syndrome with thenar atrophy.   POSTOPERATIVE DIAGNOSIS:  Left carpal tunnel syndrome with thenar atrophy.   PROCEDURE:  Left carpal tunnel release with Camitz tendon transfer.   SURGEON:  Artist Pais. Mina Marble, M.D.   ASSISTANT:  Aura Fey. Bobbe Medico.   ANESTHESIA:  General.   TOURNIQUET TIME:  40 minutes.   COMPLICATIONS:  None.   DRAINS:  None.   DESCRIPTION OF PROCEDURE:  The patient was taken to the operating room where  after the induction of adequate general anesthesia the left upper extremity  was prepped and draped in the usual sterile fashion.  An Esmarch was used to  exsanguinate the limb.  A tourniquet was inflated to 250 mmHg.  At this  point in time a standard carpal tunnel incision was made over the left hand  and wrist area as well as a small incision over the MP joint in the ulnar  side of the thumb.  Incision was taken down through the skin and  subcutaneous tissues.  The palmaris longus tendon was identified in the  proximal aspect of the wound and the palmar fascia was carefully dissected  from the underlying skin flaps throughout its entire course.  The palmar  fascia was then carefully lifted off the carpal tunnel and retracted to the  proximal aspect of the wound.  The carpal tunnel was then released.  The  median nerve was inspected.  There was significant hourglass deformity at  the level of the carpal tunnel and external neurolysis was performed.  After  this was done the palmaris longus tendon with the palmar fascia  extension  was tubed using 3-0 Vicryl and then passed through the subcutaneous tunnel  from proximal to distal to the MP joint ulnar side of the thumb.  This was  tied down using a Pulver-Taft-type weave with 4-0 Mersilene, thus completing  the Camitz tendon transfer.  The wound was then thoroughly irrigated.  Hemostasis was achieved with bipolar cautery.  It was loosely closed with 5-  0 nylon in a combination of simple and horizontal mattress sutures.  A  sterile dressing of Xeroform, 4x4's, fluffs, and opposition thumb splint was  applied.  The patient tolerated the procedure well and went to recovery room  in stable fashion.  Artist Pais Mina Marble, M.D.    MAW/MEDQ  D:  09/30/2002  T:  09/30/2002  Job:  [id number]

## 2010-10-27 NOTE — Cardiovascular Report (Signed)
NAMEMALVA, DIESING             ACCOUNT NO.:  [id number]   MEDICAL RECORD NO.:  [id number]          PATIENT TYPE:  OIB   LOCATION:  2899                         FACILITY:  [facility]   PHYSICIAN:  Thereasa Solo. Little, M.D. DATE OF BIRTH:  09-02-1924   DATE OF PROCEDURE:  08/24/2004  DATE OF DISCHARGE:                              CARDIAC CATHETERIZATION   This 77 year old female had bypass surgery in 2001.  She had done quite well  but has developed episodes of chest discomfort that wakes her up at night.  This has been going off and on for about four weeks. The discomfort is  substernal in location and radiates into her back and up into the base of  her neck and occasionally into her right jaw. Both of the episodes have been  relieved with nitroglycerin. She states the symptoms are similar to the pain  she had in 2001.  Because of this, she is brought in for outpatient cardiac  catheterization.   On exam, she had femoral bruits and an abdominal bruit and because of this,  Doppler studies were obtained. She has no evidence of abdominal aortic  aneurysm. She has evaluated velocities in her SMA and has mild iliac  disease.   PROCEDURE:  After obtaining informed consent, the patient was prepped and  draped in the usual sterile fashion exposing the right groin. Following  local anesthetic with 1% Xylocaine, the Seldinger technique was employed and  a 5-French introducer sheath was placed into the right femoral artery.  Left  and right coronary arteriography graft visualization x3 and ventriculography  and a distal aortogram was performed.   COMPLICATIONS:  None.   EQUIPMENT:  A 5-French Judkins configuration catheters with a 5-French  catheter being used to cannulate the IMA.   TOTAL CONTRAST USED:  100 mL.   RESULTS:  1.  Hemodynamic monitoring. Central aortic pressure 143/70. Left ventricular      pressure 143/6.  There was no significant valve gradient noted at the      time of  pullback.  2.  Ventriculography. Ventriculography in the RAO projection using 25 mL of      contrast at 12 mL  per second revealed a hyperdynamic left ventricle.      The ejection fraction was in excess of 65%.  The end-diastolic pressure      was 14.  No no mitral regurgitation was seen.   Distal aortogram: There was no evidence of abdominal aortic aneurysm.  There  was mild irregularities of the distal aorta and mild irregularities in the  iliacs. 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 It was noted that she  did have a small aneurysm the midportion of her hepatic artery.   Coronary arteriography: There was dense calcification noted in her left main  LAD, in her aortic arch and in her iliacs.   1.  Left main normal.  2.  LAD:  The LAD was 100% occluded proximally just after some small septal      perforators. This  was a grafted vessel.  3.  Circumflex:  The circumflex gave rise to one OM vessel. This entire      system was free of disease and was not a grafted vessel.  4.  Right coronary artery:  The right coronary artery is a grafted vessel.      It has diffuse disease. The proximal and mid segment has diffuse      irregularities with the maximal area of narrowing been about 80%. At the      distal portion of the RCA just past the acute angle of the heart was an      area of 80% narrowing. Just distal to this was the insertion site of the      of vein graft. There was bidirectional flow in the PDA and there was a      large posterior lateral branch that was free of disease.   Grafts:  1.  Saphenous vein graft to the RCA.  The graft was widely patent at the      distal anastomotic site. However, there was about a 30-40% area of      narrowing. The PDA and posterior lateral vessels were well visualized      and widely patent.  2.  Saphenous vein graft sequentially diagonal #1 and diagonal #2: The  graft      was widely patent. Diagonal #2 was a very small vessel.  The limb of the      graft between diagonal #2 in diagonal #1 was free of disease. Diagonal      #1 was a small vessel and there is retrograde filling from diagonal #1      all way down the LAD.   Internal mammary artery to the LAD:  Internal mammary artery was basically  nonfunctional and apparently had __________ about a third of the way down  the chest.   CONCLUSION:  1.  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 However, there is      excellent flow to the LAD via the retrograde filling of the diagonals.  2.  Patent graft to the right coronary artery.  3.  Nongrafted circ with no significant disease.  4.  Hepatic artery aneurysm.  5.  Hyperdynamic left ventricle with ejection fraction in excess of 65%.   I cannot explain her chest pain from a cardiac standpoint. She will be  discharged home today with follow-up in my office tomorrow. I plan to  discuss her hepatic artery aneurysm with the vascular doctors and proceed on  with their recommendations.      ABL/MEDQ  D:  08/24/2004  T:  08/24/2004  Job:  [id number]   cc:   Titus Dubin. Alwyn Ren, M.D. [facility]

## 2010-10-27 NOTE — Op Note (Signed)
[place]. [facility]  Patient:    BATINA, DOUGAN Visit Number: [id number] MRN: [id number]          Service Type: SUR Location: [phone] Attending Physician:  Georgena Spurling Dictated by:   Georgena Spurling, M.D. Proc. Date: 10/06/01 Admit Date:  10/06/2001                             Operative Report  PREOPERATIVE DIAGNOSIS:  Left knee osteoarthritis.  POSTOPERATIVE DIAGNOSIS:  Left knee osteoarthritis.  PROCEDURE:  Left total knee arthroplasty.  SURGEON:  Georgena Spurling, M.D.  ASSISTANT:  Arnoldo Morale, P.A.  INDICATION FOR PROCEDURE:  The patient is a 77 year old white female who failed conservative treatment for osteoarthritis of the knee. Informed consent was obtained.  DESCRIPTION OF PROCEDURE:  The patient was laid supine and administered general endotracheal anesthesia and Foley catheter placement. Left knee was prepped and draped in the usual sterile fashion. Midline incision was made approximately 6 inches in length with #10 blade, and median parapatellar arthrotomy was performed. The tibial ________ was used to align the tibia cutting guide perpendicular to the _______ of the tibia, and we decided to take 2 mm of bone off the medial plateau. We pinned it into place, made that cut, and then removed the _______. We then placed an intramedullary hole and and placed our intramedullary guide set on 6 degrees valgus left and then pinned our distal femoral cutting block in place. We removed the intramedullary guide, made our distal femoral cut, and removed the distal femoral cutter. ____________upper condylar axis and measured the posterior condylar angle at 3 degrees. So we then removed that, excised it as a size C, pinned it through the 3-degree hole, and then placed our full moon cutting block and screwed into place. We made our anterior, posterior, and _______ cuts. We then removed our screws and pins. We then removed our ACL,  PCL, posterior condylar, _____________ lateral menisci and balanced the knee, and flexion extension with 10-mm special _______. At this point we finished the tibia with a size C finishing guide, finished the tibia with a size 3 finishing guide, and then trialed with a size 3 tibia 10-mm polyethelene insert and a size C femur. We then cut off patella and reamed from a depth of 21 down to 12, and with a 29-template, drilled 3 _____ holes and with a trial patellar in place, we had a 21-mm thickness as well. It tracked well, did not need a lateral release. At this point we removed all the components, irrigated copiously, and then cemented in the tibia 1st, femur 2nd, patellar 3rd, and allowed it to harden with a trial 10-mm inserted in placed. Once the cement was hard, we let the tourniquet down, cauterized bleeding vessels, removed excess cement, trialed and showed a 10-mm real polyethylene, snapped it into place, placed it in extension, and then placed the Hemovac deep to the arthrotomy. We then closed the arthrotomy with interrupted #1 Vicryl, deep soft tissues with interrupted #0 Vicryl, and the subcuticular with 2-0 Vicryl and skin staples; placed in 100 degrees _____. We dressed with xeroform, dressed with sponges, sterile _______ and TED hose. The patient tolerated the procedure well.  ESTIMATED BLOOD LOSS:  250 cc.  TOURNIQUET TIME:  47 minutes. Dictated by:   Georgena Spurling, M.D. Attending Physician:  Georgena Spurling DD:  10/06/01 TD:  10/07/01 Job: [id number]

## 2010-10-27 NOTE — Op Note (Signed)
[name], [name]             ACCOUNT NO.:  [id number]   MEDICAL RECORD NO.:  [id number]          PATIENT TYPE:  INP   LOCATION:  2550                         FACILITY:  [facility]   PHYSICIAN:  Mila Homer. Sherlean Foot, M.D. DATE OF BIRTH:  February 11, 1925   DATE OF PROCEDURE:  07/29/2006  DATE OF DISCHARGE:                               OPERATIVE REPORT   SURGEON:  Mila Homer. Sherlean Foot, M.D.   ASSISTANT:  Richardean Canal, P.A.   PREOPERATIVE DIAGNOSIS:  Right knee medial compartment osteoarthritis.   POSTOPERATIVE DIAGNOSIS:  Right knee medial compartment osteoarthritis.   PROCEDURE:  Right medial compartment unicompartmental arthroplasty.   ANESTHESIA:  General.   COMPLICATIONS:  None.   DRAINS:  None.   INDICATIONS FOR PROCEDURE:  The patient is a 77 year old white female  with failure of conservative measures for osteoarthritis of the right  knee.  Informed consent was obtained for a unicompartmental  arthroplasty.   DESCRIPTION OF PROCEDURE:  The patient was laid supine and administered  general anesthesia.  The right leg was prepped and draped in the usual  sterile fashion.  The leg was then exsanguinated with the Esmarch and  the tourniquet elevated to 300 mmHg.  A small midline incision was made  from the inferior pole of the patella to the tibial tubercle,  approximately 4 cm in length.  A fresh blade was used to make a  parapatellar arthrotomy and perform a debridement of the fat pad.  I  then elevated the deep MCL from the medial crest of the tibia, going to,  but not through the semimembranosus tendon.  Then, I used the  extramedullary alignment system from [employer]immer to tension the leg straight  and then take an x-ray under C-arm to insure that we were cutting along  the mechanical axis.  I pinned the cutting block with placement of the  distal femoral cutting block,  and the distal femoral with the sagittal  saw.  At this point, I then removed the extramedullary guide and  flexed  the knee to 90 degrees and made the tibial cut as well.  I then used the  reciprocating saw to make the vertical portion of the tibial cut and  remove that piece as well.  At this point, I used the spacer block with  10 mm in flexion and extension.  I then finished the femur with the size  B femoral component cutting guide.  I finished the tibia with a size-1  tibial tray, drilling  for the lugs.  I then trialed with a size-1  tibia, B femur, 8, 9 and 10-mm polyethylenes and chose a 10.  I then  removed the components and copiously irrigated.  I did remove the  meniscus and the posterior condylar osteophytes as well prior to  cementing.  I then cemented in the components, removing excess cement,  and let the cement harden in extension.  At this point, I placed the leg in extension and repaired the capsule  with #1 figure-of-eight Vicryl sutures, and subcuticular 2-0 Vicryl  sutures and then skin staples.  I then did infiltrate the capsule  with  15 mL of 0.5% Marcaine with epinephrine.  I then dressed with Xeroform  dressing, sterile Webril and TED stockings.           ______________________________  Mila Homer Sherlean Foot, M.D.     SDL/MEDQ  D:  07/29/2006  T:  07/30/2006  Job:  [id number]

## 2010-10-27 NOTE — Cardiovascular Report (Signed)
[place]. [facility]  Patient:    [name], [name]                    MRN: [id number] Proc. Date: 01/25/00 Adm. Date:  [id number] Attending:  Loreli Dollar CC:         Lacretia Leigh. Quintella Reichert, M.D.             [facility]                        Cardiac Catheterization  INDICATIONS FOR TEST:  [name] is a 77 year old female who has known coronary disease, having had a LAD angioplasty in 1991.  Her last catheterization was in 1999 that showed mild disease in the proximal LAD, involving the bifurcation of the first diagonal.  She has been pain free until last night, when she had three episodes of recurrent chest pain that occurred at rest, finally relieved with sublingual nitroglycerin in the emergency room; she is now pain free.  Her cardiac enzymes have been negative.  PROCEDURE 1. Left heart catheterization. 2. Selective right and left coronary arteriography. 3. Ventriculography. 4. Distal aortogram.  Distal aortogram was performed because of continued left leg pain with standing.  DESCRIPTION OF PROCEDURE:  Patient was prepped and draped in the usual sterile fashion, exposing the right groin.  After applying local anesthetic with 1% Xylocaine, the Seldinger technique was employed and a 6-French introducer sheath was placed in the right femoral artery.  Selective right and left coronary arteriography and ventriculography in the RAO projection were performed.  A distal aortogram using 30 cc of contrast at 15 cc/sec was performed.  The injection started just above the renals and was followed down to the iliacs.  COMPLICATIONS:  None.  RESULTS 1. Hemodynamic monitoring:  Central aortic pressure 142/73; left ventricular    pressure 143/23, with no significant aortic valve gradient noted at the    time of pullback. 2. Ventriculography:  Ventriculography in the RAO projection revealed normal    left ventricular systolic function.   The left ventricle was heavily    trabeculated.  No mitral regurgitation was seen.  She had transient bundle    branch block during the time the catheter was in the left ventricle. 3. Distal aortogram:  There was 50% right renal artery stenosis.  There were    mild irregularities in the distal aorta, just proximal to the bifurcation.    The left iliac and right iliac appeared to be free of disease. 4. Coronary arteriography:  There was dense calcification on the fluoroscopy,    extending down the LAD and into the first diagonal.    a. Left main:  Normal.    b. LAD:  The proximal third of the LAD was densely calcified.  There was a       70% area of proximal narrowing that extended down into the ostium of the       first diagonal and then all the way down to the beginning of the second       diagonal.  The more distal portion of the LAD was only 70% obstructed.       The ostium of the first diagonal had a 90% area of narrowing with some       residual proximal narrowing also.  Because of the curvature of the       vessel, it was not felt that this lesion  is amenable to percutaneous       intervention.    c. Circumflex:  Small, with no significant disease.    d. Right coronary artery:  The right coronary artery was a very large       dominant vessel with proximal 30-40% irregularities.  The distal vessel       including the PDA and several posterolateral branches were free of       disease.  CONCLUSION 1. Severe single vessel disease involving dense calcification in the proximal    portion of the left anterior descending, extending into the bifurcation of    the first diagonal, making this complex calcified lesion high risk for    percutaneous intervention. 2. Normal left ventricular systolic function. 3. Mild renal artery stenosis on the right -- 50%. 4. No evidence of iliac disease.  PLAN:  At this point, the patient will be referred to CVTS for consideration of bypass surgery. DD:   01/25/00 TD:  01/25/00 Job: [id number]

## 2010-10-27 NOTE — Op Note (Signed)
   NAME:  [name], [name]                       ACCOUNT NO.:  [id number]   MEDICAL RECORD NO.:  [id number]                   PATIENT TYPE:  AMB   LOCATION:  DSC                                  FACILITY:  [facility]   PHYSICIAN:  Mila Homer. Sherlean Foot, M.D.              DATE OF BIRTH:  26-Apr-1925   DATE OF PROCEDURE:  05/13/2002  DATE OF DISCHARGE:                                 OPERATIVE REPORT   PREOPERATIVE DIAGNOSIS:  Left wrist carpal tunnel syndrome.   POSTOPERATIVE DIAGNOSIS:  Left wrist carpal tunnel syndrome.   OPERATION:  Left carpal tunnel release.   SURGEON:  Mila Homer. Sherlean Foot, M.D.   ASSISTANT:  None   ANESTHESIA:  Bier block   INDICATIONS FOR PROCEDURE:  The patient is a 77 year old white female with  findings of severe carpal tunnel syndrome and conservative measures have  failed.  Informed consent was obtained.   DESCRIPTION OF PROCEDURE:  The patient was laid supine and administered Bier  block anesthesia.  The left hand and wrist was prepped and draped in the  usual sterile fashion.  A 3 cm incision was made in the longitudinal palmar  crease in line with the fourth ray and the base of the thumb extending  proximally within a centimeter of the wrist crease.  Blunt dissection was  continued down through the fascia exposing the transverse carpal ligament.  I then placed a Freer retractor underneath the transverse carpal tunnel  leading edge leaning ulnarward and then under direct visualization cut that  with a 15 blade.  I then used digital palpation to ensure that the entire  transverse carpal ligament was released, then irrigated and closed with four  interrupted longitudinal mattress sutures with 4-0 Nylon.  I then dressed  with Adaptic bulky hand dressing and a wrist splint.  Tourniquet time was 20  minutes.  Complications were none.  Drains none.                                               Mila Homer. Sherlean Foot, M.D.    SDL/MEDQ  D:  05/13/2002  T:  05/13/2002   Job:  [id number]

## 2010-10-27 NOTE — H&P (Signed)
[place]. [facility]  Patient:    [name], [name] Visit Number: [id number] MRN: [id number]          Service Type: SUR Location: 5000 [id number] Attending Physician:  Georgena Spurling Dictated by:   Arnoldo Morale, P.A.-C. Admit Date:  10/06/2001                           History and Physical  DATE OF BIRTH:  01-19-1925  CHIEF COMPLAINT:  Progressively worsening left knee pain for the last six years.  HISTORY OF PRESENT ILLNESS:  This 77 year old white female patient presented to Dr. Georgena Spurling with a six-year history of gradual onset of progressively worsening left knee pain.  She has had no history of injury or any prior surgery to her left knee.  The pain again was gradual in onset, but has been getting progressively worse.  It is pretty much intermittent, but constant with any weightbearing on the left leg.  The pain seems to be described as a sharp knife-like pain over the lateral aspect of the joint line with radiation all the way down into her foot.  It increases with any standing or weightbearing, and decreases with rest.  The knee does pop and swell at times. It also keeps her up at night on occasion.  There is no grinding, locking, or giving way.  She has not had cortisone shots or Hyalgan injections in the past.  She is currently taking Mobic for pain, and that provides minimal relief.  She did have an MRI which showed a torn meniscus in the knee.  She does not use any assistive devices.  ALLERGIES 1. PENICILLIN, causing nausea. 2. SULFA, causing nausea.  CURRENT MEDICATIONS 1. Lipitor 10 mg one tab p.o. q.p.m. 2. Mobic 15 mg, 1/2 tab p.o. q.p.m.  Last dose on September 28, 2001. 3. Acuvite lutein one tab p.o. q.p.m. 4. Ecotrin one tab p.o. q.p.m. with the last dose on September 28, 2001.  PAST MEDICAL HISTORY 1. She does have hypercholesterolemia. 2. She had a CABG x 4 vessels in August 2001, by Dr. Purcell Nails.  Her    cardiologist  is Dr. Julieanne Manson, and he did clear her for surgery.  She denies any history of diabetes mellitus, hypertension, thyroid disease, hiatal hernia, reflux, ulcer disease, asthma, or any other chronic medical condition, other than noted previously.  PAST SURGICAL HISTORY 1. Partial hysterectomy. 2. Cervical surgery for a herniated disk and degenerative disk disease,    with what sounds like a fusion, by Dr. Humberto Leep. [place] approximately    20 years ago. 3. Lumbar surgery by Dr. Cristi Loron in March 2003. 4. Quadruple bypass in August 2001, by Dr. Cornelius Moras.  SOCIAL HISTORY:  She has a 50-pack-year-history of cigarette smoking, which she quit in August 2001.  She drinks alcohol just a couple of times a year. She does not use any drugs.  She is a widow and lives by herself in a one-story house with one step into the main entrance.  She does have three children, but they all live out of state.  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.  Her medical doctor is Dr. Tinnie Gens C. Hooper, and her cardiologist is Dr. Julieanne Manson.  FAMILY HISTORY:  Her mother died at age 17, due to heart failure.  Her father died at the age of 63, with a myocardial infarction.  She has one  brother alive at age 71, with heart disease.  She has one sister alive at age 51, with osteoarthritis.  She did have one sister who died at age 64, with a myocardial infarction.  Her children are ages 1, 3, and 53, and they are all alive and well.  REVIEW OF SYSTEMS:  She does report a heart murmur which she has had most of her life, which she does take antibiotics before any dental work.  She does complain of occasional constipation which she treats with Metamucil.  She has had some urinary burning for the last three days, and she did used to get bladder infections fairly frequently.  Her last one she thinks was in about 2001.  She does complain of some right shoulder pain.  She does have left carpal tunnel syndrome.   She has decreased hearing in both ears, and does wear hearing aids, and does wear glasses.  All other systems are negative and noncontributory at this time.  She does not have a living will nor a power of attorney.  PHYSICAL EXAMINATION  GENERAL:   A thin well-developed, well-nourished white female, who walks without a limp.  Mood and affect are appropriate.  She talks easily with the examiner.  Height 5 feet 1/4 inches, weight 103 pounds.  MBI is 19.  VITAL SIGNS:  Temperature 97.2 degrees F, pulse 68, respirations 12, blood pressure 118/66.  HEENT:  Normocephalic, atraumatic without frontal or maxillary sinus tenderness to palpation.  Conjunctivae pink.  Sclerae anicteric.  PERRLA. EOMs intact.  No visible external ear deformities.  Hearing acuity is decreased.  Tympanic membranes pearly gray bilaterally with good light reflex. Nose and nasal septum midline.  Nasal mucosa pink and moist, without exudates or polyps noted.  Buccal mucosa pink and moist.  Good dentition.  Pharynx without erythema or exudates.  Tongue and uvula midline.  Tongue is without fasciculations.  Uvula rises equally with phonation.  NECK:  No visible masses or lesions noted.  Trachea midline.  No palpable lymphadenopathy or thyromegaly.  Carotids are +2 bilaterally, without bruits. A full range of motion and nontender to palpation along the cervical spine.  CARDIOVASCULAR:  She has a well-healed midsternal incision.  A regular rate and rhythm.  S1, S2 present without rubs, clicks, or murmurs noted at this time.  RESPIRATORY:  Respirations even and unlabored.  Breath sounds clear to auscultation bilaterally, without rales or wheezes noted.  ABDOMEN:  Rounded abdominal contour.  Bowel sounds present x 4 quadrants, soft, nontender to palpation, without hepatosplenomegaly or CVA tenderness. She does have a well-healed low lumbar incision along the midline, nontender upon the entire length of the vertebral  column.  Femoral pulses are +2 bilaterally.  BREASTS/GU/RECTAL/PELVIC:  These examinations are deferred at this time.   MUSCULOSKELETAL:  She has no obvious deformities in the bilateral upper extremities, with a full range of motion of these extremities, without pain. Radial pulses are +2 bilaterally.  She has a full range of motion of her hips, ankles, and toes bilaterally.  She can flex both hips to about 130 degrees with full painless internal and external rotation.  DP and PT pulses are +2 bilaterally.  There is no lower extremity edema.  Her right knee has full extension and flexion to about 150 degrees.  No pain with palpation along the joint line.  No effusion.  Negative patellar apprehension and grind test. Collateral ligaments are stable.  Negative anterior drawer.  Left knee has full extension and about  150 degrees of flexion.  There is minimal crepitus with range of motion.  Pain with palpation along the lateral joint line, more than the medial joint line on the left knee.  There is a small +1 effusion. Collateral ligaments are stable.  Negative anterior drawer.  NEUROLOGIC:  Alert and oriented x 3.  Cranial nerves II-XII are grossly intact.  Strength is 5/5 bilateral upper and lower extremities.  Rapid alternating movements intact.  Deep tendon reflexes are 2+ bilateral upper and lower extremities.  Sensation intact to light touch.  Deep tendon reflexes 2+ bilateral upper and lower extremities.  RADIOLOGIC FINDINGS:  X-rays taken in April 2003, show severe medial compartment collapse with osteophyte formation of the left knee, but the lateral compartment and patellofemoral compartment appeared normal at that time.  IMPRESSION 1. 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 2. Coronary artery disease with a history of a coronary artery bypass graft    x 4. 3. Hypercholesterolemia. 4. Dysuria with possible urinary tract  infection.  PLAN:  Ms. Corum will be admitted to [facility] on October 06, 2001, where she will undergo a left total knee, versus a uni-knee arthroplasty by Dr. Sherlean Foot.  She will undergo all the routine preoperative laboratory tests and studies prior to this procedure.  Because of her symptoms of a possible urinary tract infection, I will have them notify me of her urinalysis preoperatively, and I have given her a prescription for Cipro 250 mg b.i.d., to fill only if the urinalysis shows a urinary tract infection.  If we have any medical problems while the patient is hospitalized, we will consult Dr. Alwyn Ren and/or Dr. Clarene Duke. Dictated by:   Arnoldo Morale, P.A.-C. Attending Physician:  Georgena Spurling DD:  09/29/01 TD:  09/29/01 Job: [id number]

## 2010-10-27 NOTE — Op Note (Signed)
[place]. [facility]  Patient:    [name], [name] Visit Number: [id number] MRN: [id number]          Service Type: SUR Location: 3000 3007 [id number] Attending Physician:  Cristi Loron Dictated by:   Cristi Loron, M.D. Proc. Date: 07/28/01 Admit Date:  07/28/2001                             Operative Report  PREOPERATIVE DIAGNOSES:  L4-5 and L5-S1 spinal stenosis, degenerative disk disease, spondylosis, lumbar radiculopathy, lumbago.  POSTOPERATIVE DIAGNOSES:  L4-5 and L5-S1 spinal stenosis, degenerative disk disease, spondylosis, lumbar radiculopathy, lumbago.  PROCEDURE:  Bilateral extensive laminectomy, foraminotomy, L4 and L5, using microdissection.  SURGEON:  Cristi Loron, M.D.  ANESTHESIA:  General endotracheal.  ESTIMATED BLOOD LOSS:  100 cc.  SPECIMENS:  None.  DRAINS:  None.  COMPLICATIONS:  None.  BRIEF HISTORY:  The patient is a 77 year old white female who has suffered from back and intractable left leg pain.  She failed medical management and was worked up as an outpatient with a lumbar MRI.  It demonstrated lumbar spinal stenosis at L4-5 and L5-S1.  I discussed the various treatment options. The patient weighed the risks, benefits, and alternatives of surgery and decided to proceed with a lumbar laminectomy to treat her spinal stenosis.  DESCRIPTION OF PROCEDURE:  The patient was brought to the operating room by the anesthesia team.  General endotracheal anesthesia was induced.  The patient was then turned to the prone position on the Wilson frame.  Her lumbosacral region was then prepared with Betadine scrub and Betadine solution, and sterile drapes were applied.  I then injected the area to be incised with Marcaine with epinephrine solution and used the scalpel to make a midline incision over the L4-5 and L5-S1 interspaces.  I used electrocautery to dissect down to the thoracolumbar fascia, divided the fascia  bilaterally, performing a bilateral subperiosteal dissection, stripping the paraspinous musculature from the bilateral spinous processes and laminae of L4, L5, and the upper sacrum.  I inserted the McCullough retractor for exposure and then obtained the intraoperative radiograph to confirm our location.  I then used the Leksell rongeur to remove the spinous process of L5 and L5.  I then brought in the operating microscope and under its magnification and illumination, I completed the decompression/microdissection.  I used the high-speed drill to perform a bilateral L5 and L4 laminotomy.  I then used the Kerrison punch to remove the L5-S1 ligamentum flavum, then the remainder of the L5 lamina bilaterally, then the L4-5 ligamentum flavum, and then the remainder of the bilateral L4 lamina, and then the L3-4 ligamentum flavum.  I used the Kerrison punch to remove excess ligamentum flavum and the very medial aspect of the facet joint bilaterally at L4-5 and L5-S1.  I identified and performed a foraminotomy about the bilateral L5 and S1 nerve root.  The worst area seemed to be at L4-5 on the left, where the patient had severe facet and ligamentum flavum hypertrophy and because of her scoliosis seemed to have some compression of the L5 nerve root from the medial aspect of the pedicle.  I then carefully used the high-speed drill to drill off the medial aspect of the left L5 pedicle to further decompress the L5 nerve.  At this point I had a good decompression of the thecal sac and bilateral L5 and S1 nerve roots. inspected the L4-5 and  intervertebral disks.  The disks were bulging but were not ruptured.  I then achieved stringent hemostasis using the bipolar electrocautery.  I then copiously irrigated the wound out with bacitracin solution.  I removed the solution and then removed the McCullough retractor and reapproximated the patients thoracolumbar fascia with interrupted #1 Vicryl suture, the  subcutaneous tissue with interrupted 3-0 Vicryl suture, and the skin with Steri-Strips and benzoin.  The wound was then coated with bacitracin ointment and sterile dressings applied.  The drapes were removed, and the patient was subsequently returned to the supine position, where she was extubated by the anesthesia team and transported to the postanesthesia care unit in stable condition.  All sponge, instrument, and needle counts were correct at the end of this case. Dictated by:   Cristi Loron, M.D. Attending Physician:  Tressie Stalker D DD:  07/28/01 TD:  07/29/01 Job: [id number]

## 2010-10-27 NOTE — Op Note (Signed)
[name], [name]                    MRN: [id number] Proc. Date: 01/26/00 Adm. Date:  [id number] Attending:  Tressie Stalker CC:         Caprice Kluver, M.D.             [name] Rossetti, M.D.             Lenise Herald, M.D.                           Operative Report  PREOPERATIVE DIAGNOSIS: Severe two vessel coronary artery disease with Class IV unstable angina.  POSTOPERATIVE DIAGNOSIS: Severe two vessel coronary artery disease with Class IV unstable angina.  PROCEDURE:  Median sternotomy for coronary artery bypass grafting times four ( left internal mammary artery to distal left anterior descending coronary artery, saphenous vein graft to first diagonal branch and sequential saphenous vein graft to second diagonal branch, saphenous vein graft to distal right coronary artery).  SURGEON:  Salvatore Decent. Cornelius Moras, M.D.  ASSISTANT:  Mr. Gershon Crane  ANESTHESIA:  General.  BRIEF CLINICAL NOTE:  The patient is a 77 year old white female from [facility] followed by Dr. [name] Rossetti and referred by Dr. Caprice Kluver for management of coronary artery disease.  The patient has a history of coronary disease status post percutaneous coronary intervention on the proximal left anterior descending coronary artery in 1991.  She also has history of chronic anemia,  degenerative joint disease, and chronic tobacco abuse with chronic obstructive pulmonary disease.  She has been otherwise well and in her usual state of health, until over the last month, she has developed new onset of symptoms of Class IV unstable angina.  She was admitted to the hospital and underwent catheterization by Dr. Clarene Duke on January 25, 2000.  This demonstrated severe two vessel coronary artery disease with normal left ventricular function.  OPERATIVE CONSENT:  The patient is counseled at length regarding the indications and potential benefits of coronary artery bypass  grafting.  She understands the associated risks of surgery including but not limited to risk of death, stroke, myocardial infarction, bleeding requiring blood transfusion, arrhythmia, infection, and recurrent coronary artery disease.  She accepts these risks as well as any unforeseen complications and agrees to proceed with surgery as described.  OPERATIVE NOTE IN DETAIL:  The patient was brought to the operating room on the above mentioned date and invasive hemodynamic monitoring was established by the anesthesia service under the care and direction of Dr. Katrinka Blazing.  The patient was placed in the supine position on the operating table.  Following induction with general endotracheal anesthesia, the patient is prepped, abdomen, both groins, and both lower extremities are prepared and draped in a sterile manner.  The median sternotomy incision is performed and the left internal mammary artery is dissected from the chest wall and prepared for bypass grafting.  The left internal mammary artery is notably a good quality, conduit.  Simultaneous the saphenous vein is obtained from the patients right thigh through its longitudinal incisions.  The saphenous vein is a good quality conduit for bypass grafting.  The patient is heparinized systemically.  The pericardium is opened.  The ascending aorta is inspected.  It is notably free of any palpable plaques or calcifications.  The patient does have considerably enlarged lungs consistent with chronic obstructive pulmonary disease and her  diaphragms are flattened and low. As the result the heart is somewhat low in the chest.  The coronaries are inspected with consideration of possible off-pump coronary artery bypass grafting, however, the left anterior descending coronary artery is notably intramyocardial during the majority of its course down the anterior wall of the left ventricle.  Off-pump bypass is felt not to be technically appropriate.  The  ascending aorta and the right atrium are cannulated for cardiopulmonary bypass.  The patients preoperative hematocrit was notably 29%.  This in combination with her very small body size with total estimated body surface area of only 1.4 it is felt that the degree of dilution related to institution of cardiopulmonary bypass would almost certainly create severe anemia to the point of dangerously low hemoglobin and hematocrit levels.  Therefore the pump is primed with 2 units of packed red blood cells.  Cardiopulmonary bypass is begun.  The surface of the heart is inspected. Distal sites are selected for coronary bypass grafting.  Portions of saphenous vein in the left internal mammary artery are trimmed to the appropriate length.  A temperature probe is placed in the left ventricular septum and a styrofoam pad is placed to protect the left phrenic nerve from thermal injury.  A cardioplegia catheter is placed in the ascending aorta.  The patient is cooled to 32 degrees systemic temperature.  The aortic cross clamp is applied and cardioplegia is delivered in an antegrade fashion through the aortic root.  Additional dose of cardioplegia administered intermittently throughout the cross clamp portion of the operation both through the aortic root and down subsequently placed vein grafts to maintain acceptable temperature below 15 degrees centigrade.  Iced saline flush is applied for topical hypothermia.  The following distal coronary anastomoses are performed: 1. The distal right coronary artery is grafted with a saphenous vein graft in the end-to-side fashion using running 7-0 Prolene suture.  This coronary measured 2 mm in diameter and is of good quality.  There is a 50% proximal stenosis.  2. The first diagonal branch of the left anterior descending coronary artery is grafted with the saphenous vein graft end-to-side fashion using running 7-0 Prolene suture.  This coronary measures 1.5 mm in  diameter and is of good quality.  There is 90% proximal stenosis.  3. The second  diagonal branch off the left anterior descending coronary artery is grafted using the sequential saphenous vein graft off the vein placed at the first diagonal branch.  This coronary measures 1.3 mm in diameter and is of good quality.  There is 80% proximal stenosis.  4. The distal left anterior descending coronary artery is grafted with the left internal mammary artery using running 8-0 Prolene suture.  This coronary measures 2 mm in diameter and it is of good quality.  There is 90% proximal stenosis.  Both proximal saphenous vein anastomoses are performed directly to the ascending aorta prior to removal of the aortic cross clamp.  The septal temperature is noted to rise rapidly and dramatically upon reperfusion of the left internal mammary artery.  The aortic cross clamp is removed with a total cross clamp time of 54 minutes.  The heart began to beat spontaneously without need for cardioversion.  All proximal and distal anastomoses are inspected for hemostasis and appropriate graft orientation.  The epicardial pacing wires are affixed to the right ventricular outflow tract into the right atrial appendage.  The patient is rewarmed to greater than 37 degrees centigrade temperature.  The patient is weaned  from cardiopulmonary bypass without difficulty.  The patients rhythm at separation from bypass is normal sinus rhythm.  No antitropic support is required.  Total cardiopulmonary bypass time for the operation is 65 minutes.  The venous and arterial cannulae are removed uneventfully.  Protamine is administered to reverse the anticoagulation.  The mediastinum and the left chest are irrigated with saline solution containing vancomycin.  Meticulous surgical hemostasis is ascertained.  The mediastinum and the left chest are drained with three chest tubes placed through separate stab incisions inferiorly.  The  median sternotomy is closed in a routine fashion.  The right thigh incisions are closed in multiple layers in the routine fashion.  All skin incisions are closed with subcuticular skin closures.  The patient tolerated the procedure well and is transported to the surgical intensive care unit in stable condition.  There are no intraoperative complications.  The patient was transfused a total of 3 units packed red blood cells during cardiopulmonary bypass including the 2 units placed in the pump for pump prime.  There were no complications. DD:  01/26/00 TD:  01/27/00 Job: [id number]

## 2010-10-27 NOTE — Discharge Summary (Signed)
[place]. [facility]  Patient:    [name], [name] Visit Number: [id number] MRN: [id number]          Service Type: [facility] Location: [id number] Attending Physician:  Herold Harms Dictated by:   Jamelle Rushing, P.A. Admit Date:  10/08/2001 Discharge Date: 10/13/2001                             Discharge Summary  ADMISSION DIAGNOSES: 1. Severe medial compartment osteoarthritis and degenerative cartilage tearing    of her left knee. 2. Coronary artery disease with coronary artery bypass graft. 3. Hypercholesterolemia. 4. Dysuria.  DISCHARGE DIAGNOSES: 1. Left total knee arthroplasty. 2. Postoperative blood loss anemia. 3. Hypercholesterolemia. 4. Coronary artery disease with bypass graft.  HISTORY OF PRESENT ILLNESS:  The patient is a 77 year old, white female with a six-year history of gradual onset and progressively worsening left knee pain. The patient has had no injury or surgical procedures in the past.  The pain is a sharp, knife-like and constant with any type of weightbearing activity and improves with rest.  The patient does have popping in the knee, swelling and occasional night pain.  She is not using any assist device at the current time.  ALLERGIES:  PENICILLIN and SULFA.  MEDICATIONS: 1. Lipitor 10 mg p.o. q.d. 2. Mobic 7.5 mg p.o. q.d. 3. Ocuvite one tablet p.o. q.d. 4. Ecotrin one tablet p.o. q.d.  PROCEDURE:  On October 06, 2001, the patient was taken to the operating room by Dr. Georgena Spurling, assisted by Arnoldo Morale, P.A.-C.  The patient underwent a left total knee arthroplasty.  The patient tolerated this procedure well. Estimated blood loss was 250 cc.  Tourniquet time was 47 minutes and one Hemovac drain was left in place.  The patient received a postoperative femoral nerve block for postop pain management.  The patient was transferred to the orthopedic floor in good condition.  CONSULTATIONS: 1. Physical  therapy. 2. Occupational therapy. 3. Case management.  HOSPITAL COURSE:  On October 06, 2001, the patient was admitted to [facility] under the care of Dr. Georgena Spurling.  The patient was taken to the OR where a left total knee arthroplasty was performed.  The patient tolerated this procedure well.  A femoral nerve block was performed for postoperative pain management and the patient was transferred to the orthopedic floor for routine postop care.  The patient was placed on Lovenox for routine DVT prophylaxis.  The patient then incurred a total of two days postoperative care on the orthopedic floor before being transferred to the rehabilitation unit. During this time, the patient developed some postoperative blood loss anemia. Her H&H dropped to 7.6/22.3.  The patient received two units of packed red blood cells without any difficulty.  The patient worked very well with physical therapy.  Her wound remained benign for any signs of infection or complications.  Her vital signs remained stable.  The patient was evaluated and accepted to the rehabilitation unit on postop day #2.  LABORATORY DATA AND X-RAY FINDINGS:  CBC on April 30, with WBC 7.1, hemoglobin 7.6, hematocrit 22.3, platelets 209.  The patient received two units of packed red blood cells and rechecked on the rehabilitation floor.  Routine chemistries with sodium of 142, potassium 3.5, chloride 109, CO2 26, BUN 15, creatinine 0.6 and a glucose of 127.  Routine urinalysis on admission was negative.  DISCHARGE MEDICATIONS:  1. Colace 100  mg p.o. b.i.d.  2. Senokot one tablet p.o. b.i.d. a.c.  3. Zocor 20 mg p.o. q.d.  4. Vioxx 50 mg p.o. q.d.  5. Lovenox 30 mg subcu b.i.d.  6. Ocuvite one tablet p.o. q.h.s.  7. OxyContin CR 10 mg p.o. q.12h.  8. Trinsicon one tablet p.o. b.i.d.  9. Laxative or enema of choice. 10. Reglan 10 mg p.o. q.8h. p.r.n. 11. Percocet one to two tablets every four to six hours p.r.n. 12.  Robaxin 500 mg p.o. q.6h. p.r.n.  DISPOSITION:  Discharged to rehabilitation floor.  SPECIAL INSTRUCTIONS:  Continue medications as dispensed on orthopedic floor.  ACTIVITY:  The patient may be weightbearing as tolerated with the use of a walker.  The patient is to have the use of a CPM daily with a minimum of eight hours a day.  DIET:  No restrictions.  WOUND CARE:  The patient is to have wound checked daily for any signs of infection.  Staples are to be removed on postop day #14.  FOLLOWUP:  The patient is to have a follow-up appointment with Dr. Sherlean Foot two weeks from the date of discharge.  CONDITION ON DISCHARGE:  Condition on discharge to the rehabilitation floor is improved and good. Dictated by:   Jamelle Rushing, P.A. Attending Physician:  Herold Harms DD:  10/20/01 TD:  10/21/01 Job: [id number]

## 2010-10-27 NOTE — Cardiovascular Report (Signed)
[name], [name]             ACCOUNT NO.:  [id number]   MEDICAL RECORD NO.:  [id number]          PATIENT TYPE:  OIB   LOCATION:  2899                         FACILITY:  [facility]   PHYSICIAN:  Thereasa Solo. Little, M.D. DATE OF BIRTH:  June 21, 1924   DATE OF PROCEDURE:  11/29/2005  DATE OF DISCHARGE:                              CARDIAC CATHETERIZATION   INDICATIONS FOR TEST:  This 77 year old female had bypass surgery in 2001.  She subsequently has had repeat cardiac catheterizations.  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  She presented with the complaints of discomfort at the base of her throat  and increasing dyspnea on exertion.  A nuclear study was performed that  showed some mild apical ischemia but because of her continued complaints the  decision was made to proceed on with outpatient cardiac catheterization.   After obtaining informed consent the patient was prepped and draped in the  usual sterile fashion exposing the right groin.  Following local anesthetic  with 1% Xylocaine the Seldinger technique was employed and a 5-French  introducer sheath was placed into the right femoral artery.  Left and right  coronary arteriography and ventriculography in the RAO projection was  performed.   Graft visualization including saphenous vein grafts x2 and a distal  aortogram was also performed.   COMPLICATIONS:  None.   EQUIPMENT:  5-French Judkins configuration catheters.   CONTRAST:  125 mL.   The internal mammary artery graft was not reevaluated since it had been  occluded at the catheterization in 2006.   RESULTS:   1. HEMODYNAMIC MONITORING:  Her central aortic pressure was 113/57.  Her  left ventricular pressure was 115/2 and there was no gradient noted at the  time of pullback.   2. VENTRICULOGRAPHY:  Ventriculography in the RAO projection using 20 mL of  contrast at 12 mL per second revealed normal to slightly  hyperdynamic LV  function with an ejection fraction in excess of 60%.  The end-diastolic  pressure was 3.   3. DISTAL AORTOGRAM:  There was dense calcification of her aortic arch and  dense calcification of her iliacs and bifurcation.  A Glidewire was actually  used to navigate through the iliacs in the bifurcation.  20 mL at 20 mL per  second was injected just above the renal arteries.  This showed only minor  irregularities in the distal aorta and iliacs.  Harrington rods are in place  and there was some overlap of the renal arteries with the Harrington rods.   4. CORONARY ARTERIOGRAPHY.:  1.  Left main:  The left main was about a 4 mm vessel with a less than 20%      area of hypolucency in the proximal segment.  There was brisk distal      flow.  My catheter never damped in this area.  Intracoronary      nitroglycerin made no change in this and I did not feel this represents      a significant area of narrowing.  2.  LAD:  The LAD was  100% occluded after the septal perforator.  3.  Circumflex:  The circumflex gave rise to a small system with one long OM      vessel.  This system was free of disease.  This was a non-grafted      system.  4.  Right coronary artery:  The native right coronary had a long area in the      mid and distal portion of the vessel of 70-80% irregularities.  There      was bidirectional flow in the distal portion of the vessel and the PDA      and posterior lateral vessels were visualized via the native      circulation.   GRAFTS:  1.  The saphenous vein graft to the RCA was widely patent.  The PDA and two      posterior lateral vessels were well visualized and free of disease and      there was some retrograde filling up to the mid portion of the RCA via      this graft.  2.  Saphenous vein graft to diagonal #1 and diagonal #2.  The graft itself      was widely patent.  It inserted first into the first and then the second      diagonal.  Both of these  vessels were patent.  There was retrograde      filling of the LAD from diagonal #2 and the LAD diagonal system were      relatively small vessels, but patent.  3.  Internal mammary artery graft:  This was not visualized, but was      occluded in 2006.   CONCLUSION:  1.  Patent saphenous vein graft to the diagonals and to the right coronary      artery with retrograde filling of the left anterior descending via the      diagonals.  2.  No significant disease in non-grafted circumflex system.  3.  20% hypolucent area in a 4 mm left main vessel that is not      hemodynamically significant.  4.  Normal left ventricular to slightly hyperdynamic left ventricular      function.  5.  Dense calcification of the aortic arch and the distal aorta, but no      evidence of any significant stenosis by distal angiogram.   I cannot explain her discomfort based on her anatomy.  She will be  discharged home today.  I plan to reevaluate her in the office tomorrow.  Because of her breathlessness I may terminate the use of the beta blockers  to see if this is playing a role in her complaints.           ______________________________  Thereasa Solo. Little, M.D.     ABL/MEDQ  D:  11/29/2005  T:  11/29/2005  Job:  [id number]   cc:   Cath Lab   Titus Dubin. Alwyn Ren, M.D. [facility]  [phone] [place]  [place] [address]

## 2010-10-27 NOTE — Discharge Summary (Signed)
NAME:  [name], [name]                       ACCOUNT NO.:  [id number]   MEDICAL RECORD NO.:  [id number]                   PATIENT TYPE:  INP   LOCATION:  3028                                 FACILITY:  [facility]   PHYSICIAN:  Coletta Memos, M.D.                  DATE OF BIRTH:  Jun 27, 1924   DATE OF ADMISSION:  08/26/2003  DATE OF DISCHARGE:  09/02/2003                                 DISCHARGE SUMMARY   ADMISSION DIAGNOSES:  L2-3, 3-4, 4-5, 5-1 degenerative disk disease, lumbar  stenosis, lumbago, lumbar radiculopathy.   DISCHARGE DIAGNOSES:  L2-3, 3-4, 4-5, 5-1 degenerative disk disease, lumbar  stenosis, lumbago, lumbar radiculopathy.   PROCEDURE:  Redo laminectomy L4-5, posterior lumbar interbody fusion L2-3,  L3-4 with DuPuy TLIF carbon fiber interbody prostheses, posterior segmental  instrumentation Legacy MA titanium pedicle screws and rods L2-S1  bilaterally, posterolateral arthrodesis.   COMPLICATIONS:  None.   DISCHARGE STATUS:  Alive and well.  Incision clean and dry without signs of  infection. She is able to ambulate, tolerating a regular diet and is voiding  without difficulty.   HISTORY:  [name] is a 77 year old woman who has had intractable back  and lower extremities pain. She failed long-term conservative management.  Diagnostic work-up showed significant degenerative changes present from L2-  S1. She and Dr. Lovell Sheehan made a decision for her to undergo operative  decompression.   The patient was admitted, taken to the operating room and had an  uncomplicated procedure.  Postoperatively, she has had a slow but very  steady and good recovery.  On discharge, she is controlling her pain with  oral pain medications. The patient was discharged to home with Valium for  muscle relaxant and Percocet for pain control, 10 mg.  She was given  instructions to call our office for a return appointment.                                                Coletta Memos,  M.D.    KC/MEDQ  D:  09/02/2003  T:  09/02/2003  Job:  [id number]

## 2010-10-27 NOTE — Op Note (Signed)
NAME:  [name], [name]                       ACCOUNT NO.:  [id number]   MEDICAL RECORD NO.:  [id number]                   PATIENT TYPE:  INP   LOCATION:  3028                                 FACILITY:  [facility]   PHYSICIAN:  Cristi Loron, M.D.            DATE OF BIRTH:  May 05, 1925   DATE OF PROCEDURE:  08/26/2003  DATE OF DISCHARGE:                                 OPERATIVE REPORT   PREOPERATIVE DIAGNOSIS:  L2-3, 3-4, 4-5, 5-1 degenerative disk disease,  stenosis, lumbago, lumbar radiculopathy.   POSTOPERATIVE DIAGNOSIS:  L2-3, 3-4, 4-5, 5-1 degenerative disk disease,  stenosis, lumbago, lumbar radiculopathy.   OPERATION PERFORMED:  Re-do laminectomy L4 and L5; posterior lumbar  interbody fusion at L2-3 and L3-4; insertion of L2-3 and L3-4 interbody  prosthesis (Depuy TLIF carbon fiber cage); posterior segmental  instrumentation with Legacy M8 titanium pedicle screws and rods, L2 to S1  bilaterally; posterolateral arthrodesis, L2-3, L3-4, L4-5, L5-S1 with local  morcellized autograft bone and platelet rich gel as well as bone morphogenic  protein.   SURGEON:  Cristi Loron, M.D.   ASSISTANT:  Coletta Memos, M.D.   ANESTHESIA:  General endotracheal.   ESTIMATED BLOOD LOSS:  400 mL.   SPECIMENS:  None.   DRAINS:  None.   INDICATIONS FOR PROCEDURE:  The patient is a 77 year old white female who  has suffered from intractable back and leg pain.  She failed extensive  medical management and was worked up with a lumbar MRI and myelo CT which  demonstrated severe degenerative disk disease, stenosis, facet arthropathy,  etc. at L2-3, 3-4, 4-5 and 5-1.  I discussed the various treatment options  with her and told here there would be no simple solution for her back  problems.  We discussed a multilevel lumbar fusion.  The patient weighed the  risks, benefits and alternatives to surgery and decided to proceed with the  operation.  She got preoperative clearance from her  cardiologist, Dr.  Clarene Duke.   DESCRIPTION OF PROCEDURE:  The patient was brought to the operating room by  the anesthesia team.  General endotracheal anesthesia was induced.  The  patient was then turned to the prone position on the chest rolls.  Her  lumbosacral region was then prepared with Betadine scrub and Betadine  solution and sterile drapes were applied.  I then injected the area to be  incised with Marcaine with epinephrine solution.  I used a scalpel to make a  midline incision over the spinous process of L2 down to the upper sacrum.  I  used electrocautery to perform a bilateral subperiosteal dissection  stripping the paraspinous musculature from the spinous process and lamina of  L2 all the way down to the upper sacrum.  I inserted the VersaTrac retractor  for exposure and then obtained intraoperative radiograph to confirm our  location.   I began by incising the interspinous ligament at L3-4 and then used a  Leksell rongeur to remove the L3 spinous process and part of the L3 lamina.  We saved this bone and then later cleared all soft tissue to be used in the  fusion process.  I inspected the L2-3 level looked quite degenerated and on  x-rays there was still quite a bit of scoliosis and degenerative change at  that level and therefore I decided to include this level in the fusion as  well.  I then used a high speed drill to perform bilateral L2 laminotomies.  We then removed the L2-3 ligamentum flavum.  We completed the L3 laminectomy  with the Kerrison punch and then we began working down in the area of prior  surgery.  We used a high speed drill to widen the prior laminectomies and  performed a wider foraminotomy about the nerve roots using the Kerrison  punch.  We achieved a wide decompression of the thecal sac and did  foraminotomies about the bilateral L3, 4, 5, and S1 nerve roots and did a re-  do laminectomy at L4 and L5.  Having completed the decompression, we now   turned our attention to the arthrodesis.  We carefully removed the right L2-  3 and 3-4 facets.  This gave [place] wide lateral exposure of the intervertebral  disk.  We incised the intervertebral disk with a 15 blade scalpel at L2-3  and L3-4 and performed aggressive diskectomy in preparation for the  transforaminal lumbar interbody fusion, i.e. TLIF.  We prepared the  vertebral end plates and X9-1 with the curets clearing off the soft tissue.   We then turned our attention to the posterior segmental instrumentation.  We  exposed the transverse process of L2, 3, 4, 5 and the sacral ala using  electrocautery and then under fluoroscopic guidance, we cannulated the  bilateral 2, 3, 4, 5 and S1 pedicles with the bone probe.  We tapped the  pedicles and felt inside the tapped pedicles for cortical breeches.  We then  inserted 5.5 x 40 and 45 mm pedicle screws bilaterally at L2, 3, 4, and 5.  We inserted 5.5 x 35 and 40 mm pedicles screws at S1.  We then palpated  along the medial aspect of the bilateral L2, 3, 4, 5 and S1 pedicles and  noted there were no cortical breeches and that the nerve roots were not  injured.   We then placed a temporary rod on the left at L2-3 and secured the rod in  place and distracted the pedicles to gain wider exposure of the interspaces  at L2-3.  We then inserted a 7 x 7 mm TLIF carbon fiber cage into the  interspace for after gently retracting the neural structures out of harm's  way.  We then repeated this procedure at L3-4 performing a TLIF and then  that was fashioned at that level as well.  There was a good snug fit of the  interbody device at both levels.  We packed in front and posterior to the  interbody prosthesis with a combination of local morcellized autograft bone  and platelet rich gel completing the TLIF.   I then cut two appropriate length rods. We bent them in the lordotic position. We then connected the unilateral pedicle screws with the rods. We   then secured the rods in place with the appropriate caps.  We placed cross  connector between the two rods and secured it as well completing the  posterior instrumentation.   We now turned our  attention to the posterolateral arthrodesis.  We used a  high speed drill to decorticate the remaining facets at L2-3, 3-4, 4-5 and 5-  1 as well as the pars region and the transverse processes.  We then laid a  BMP and tricalcium phosphate as well as local morcellized autograft bone  over the decorticated posterolateral structures completing the  posterolateral arthrodesis.  We inspected the thecal sac and noted there was  one small area of thinning of the thecal sac with exposed arachnoid.  There  was no leakage of cerebrospinal fluid but felt prudent to place a figure-of-  eight suture of 6-0 Prolene at that level.  We then achieved stringent  hemostasis using bipolar electrocautery.  We inspected the thecal sac and  bilateral L3, 4, 5, and S1 nerve roots and noted that they were well  decompressed.  We then removed the VersaTrac retractor and then  reapproximated the patient's thoracolumbar fascia with interrupted #1 Vicryl  sutures, subcutaneous tissue with interrupted 2-0 Vicryl suture and the skin  with Steri-Strips and Benzoin. The wound was then coated with bacitracin  ointment, sterile dressing was applied, the  drapes were removed.  The patient was subsequently returned to supine  position where she was extubated by the anesthesia team and transported to  the post anesthesia care unit in stable condition.  All sponge, needle and  instrument counts were correct at the end of the case.                                               Cristi Loron, M.D.    JDJ/MEDQ  D:  08/26/2003  T:  08/30/2003  Job:  [id number]

## 2010-10-27 NOTE — Discharge Summary (Signed)
[place]  Patient:    [name], [name]                    MRN: [id number] Adm. Date:  [id number] Disc. Date: [id number] Attending:  Tressie Stalker Dictator:   Carlye Grippe. CC:         Thereasa Solo. Little, M.D.             Lacretia Leigh. Hooper                           Discharge Summary  HISTORY OF PRESENT ILLNESS:  This is a 77 year old female who was admitted to Dr. Gaspar Garbe B. Littles service for known coronary artery disease, having had a previous LAD PTCA in 1991.  She presented this hospitalization with three episodes of recurrent chest pain that recurred at rest that were relieved with sublingual nitroglycerin in the emergency department at [facility].  She was admitted for further evaluation and treatment, including rule out myocardial infarction and medical stabilization with plans also for repeat cardiac catheterization.  MEDICATIONS ON ADMISSION:  None.  ALLERGIES:  PENICILLIN and CODEINE cause nausea.  SOCIAL HISTORY:  She is married with three children.  Tobacco use of one pack per day x 19 years.  No ETOH.  FAMILY HISTORY:  Please see the history and physical done at the time of admission.  PHYSICAL EXAMINATION:  The physical exam at admission was only remarkable for some left basilar crackles, otherwise unremarkable.  PAST MEDICAL HISTORY:  Coronary artery disease.  History of claudication. History of noncompliance with medications.  History of macular degeneration of the left eye.  History of chronic constipation.  History of degenerative joint disease.  PAST SURGICAL HISTORY:  Hysterectomy and back surgery.  LABORATORY DATA:  The initial EKG showed sinus bradycardia, nonspecific T-wave changes, and poor R-wave progression.  The initial chest x-ray showed no acute process.  Initial CPKs and troponins were unremarkable.  HOSPITAL COURSE:  The patient was admitted and placed on  intravenous nitroglycerin and heparin.  She was kept NPO for cardiac catheterization scheduled on the same date.  On January 25, 2000, the patient was taken to the cardiac catheterization laboratory by Gaspar Garbe B. Little, M.D., and was found to have significant coronary artery disease.  The left main was noted to be normal.  The proximal one third of the LAD showed dense calcification and a proximal 70% lesion that extends into the ostium of the first diagonal. Additionally, there was an ostial 90% lesion at the diagonal and also a distal 70% second diagonal lesion.  The circumflex was small with no significant disease.  The right coronary artery was a large dominant vessel with a proximal 40% lesion.  The PDA and proximal left circumflex were normal. Systolic function was normal.  The ejection fraction was greater than 60%. There was a 50% right renal artery stenosis noted.  There was mild irregularity at the abdominal aorta proximal to the bifurcation.  The iliacs were within normal limits.  A CVTS consultation was requested to evaluate as the lesions were felt to be too complex for percutaneous intervention. Salvatore Decent. Cornelius Moras, M.D., evaluated the patient and studies and agreed with recommendations for proceeding with coronary artery bypass grafting as the best revascularization option due to the severity of the anatomical findings and the increasing nature of her symptoms.  On January 26, 2000, the patient underwent  the following procedure:  Coronary artery bypass grafting x 4.  The following grafts were placed:  1. Left internal mammary artery to the LAD. 2. Saphenous vein graft to first diagonal and second diagonal in a sequential manner.  3. Saphenous vein graft to the right coronary artery.  The cross clamp time was 54 minutes.  The pump time was 65 minutes.  The patient came off cardiopulmonary bypass in normal sinus rhythm on no inotropic agents and was transferred to the surgical  intensive care unit in stable condition. Postoperatively, the patient did very well.  She remained hemodynamically stable.  The initial cardiac indices in the unit were 2.3 on 3 cc of dopamine. She was extubated without difficulty.  She was neurologically intact, awake, and alert on postoperative day #1.  On postoperative day #1, her pulse rate was in the 70s and did improve with atrial pacing and this was kept for a short time, but discontinued.  Lopressor was held at that time.  She was started on a gentle course of diuresis.  Routine lines, monitors, and tubes were discontinued in a stepwise manner.  She was transferred to the 2000 telemetry unit on postoperative day #1 and began on a course of normal mobilization with cardiac rehabilitation phase 1 modalities.  On postoperative day #2, the patient was noted to have an anemia with a hemoglobin of 8.0 and a hematocrit of 24.5.  She was followed clinically.  Over the next two days, the patient showed a significant improvement in overall status.  Her incisions were healing quite well without signs of infection.  She was tolerating a diet and activities.  She was afebrile.  She was in normal sinus rhythm.  Her anemia was felt to be clinically stable and she was tolerating it well on iron supplementation.  DISPOSITION:  She was discharged in stable condition on January 30, 2000.  DISCHARGE MEDICATIONS: 1. Enteric-coated aspirin 325 mg q.d. 2. Lopressor 12.5 mg b.i.d. 3. Lipitor 10 mg q.h.s. 4. Iron sulfate 325 mg b.i.d. 5. Colace 100 mg b.i.d. 6. Percocet p.r.n. as needed for pain.  FOLLOW-UP:  Appointments with Thereasa Solo. Little, M.D., and Salvatore Decent. Cornelius Moras, M.D.  The appointment with Dr. Cornelius Moras was Monday, February 26, 2000, at 9:30 a.m.  DISCHARGE INSTRUCTIONS:  The patient received written instructions in regards to medications, activity, diet, wound care, and follow-up.  FINAL DIAGNOSES:  1. Severe coronary artery disease as  described above.  2. Degenerative joint disease.  3. Positive family history of coronary artery disease.   4. Noncompliance with medications.  5. Left lower extremity claudication-type pain.  6. History of lower back disk problems.  7. History of previous percutaneous intervention, left anterior descending in     1991.  8. History of chronic constipation.  9. History of left eye macular degeneration. 10. History of degenerative joint disease. 11. Right renal artery stenosis, 50%. 12. Postoperative anemia. DD:  02/08/00 TD:  02/09/00 Job: [id number]

## 2011-03-20 ENCOUNTER — Encounter: Payer: Self-pay | Admitting: Internal Medicine

## 2011-03-20 ENCOUNTER — Ambulatory Visit (INDEPENDENT_AMBULATORY_CARE_PROVIDER_SITE_OTHER): Payer: Medicare Other | Admitting: Internal Medicine

## 2011-03-20 ENCOUNTER — Ambulatory Visit (INDEPENDENT_AMBULATORY_CARE_PROVIDER_SITE_OTHER)
Admission: RE | Admit: 2011-03-20 | Discharge: 2011-03-20 | Disposition: A | Discharge status: Home / Self Care | Payer: Medicare Other | Source: Ambulatory Visit | Attending: Internal Medicine | Admitting: Internal Medicine

## 2011-03-20 VITALS — BP 138/78 | HR 67 | Ht 61.0 in | Wt 104.2 lb

## 2011-03-20 DIAGNOSIS — J449 Chronic obstructive pulmonary disease, unspecified: Secondary | ICD-10-CM

## 2011-03-20 DIAGNOSIS — Z23 Encounter for immunization: Secondary | ICD-10-CM

## 2011-03-20 DIAGNOSIS — I2510 Atherosclerotic heart disease of native coronary artery without angina pectoris: Secondary | ICD-10-CM

## 2011-03-20 NOTE — Progress Notes (Signed)
Patient ID: [name], female    DOB: [birth year], 77 y.o.   MRN: [id number]  HPI 68 yoF former smoker followed for COPD with hx CAD and GERD. Last here March 21, 2010 and got through winter without bad problems. Denies any symptoms related to the current pollen season. She comments that she only notes dyspnea if she is square dancing each week. She does not see a change in exercise tolerance there. Occasional dry morning cough. Denies fever, sweats, glands, chest pain or palpitation. She continues Advair 100 twice daily and almost never needs her rescue inhaler.   03/20/11- 89 yoF former smoker followed for COPD with hx CAD and GERD She continues to square dance regularly. Uses Advair, but never needs her rescue inhaler. Aware of some postnasal drip with scant clear mucus. Denies any swallowing problems and says her chest feels "fine". Does note some chronic hoarseness. Chest x-ray 02/20/2010 showed COPD, status post CABG, NAD.  Review of Systems See HPI Constitutional:   No-   weight loss, night sweats, fevers, chills, fatigue, lassitude. HEENT:   No-  headaches, difficulty swallowing, tooth/dental problems, sore throat,       No-  sneezing, itching, ear ache, nasal congestion,  +post nasal drip,  CV:  No-   chest pain, orthopnea, PND, swelling in lower extremities, anasarca, dizziness, palpitations Resp: No-   shortness of breath with exertion or at rest.              No-   productive cough,  No non-productive cough,  No-  coughing up of blood.              No-   change in color of mucus.  No- wheezing.   Skin: No-   rash or lesions. GI:  No-   heartburn, indigestion, abdominal pain, nausea, vomiting, diarrhea,                 change in bowel habits, loss of appetite GU: No-   dysuria, change in color of urine, no urgency or frequency.  No- flank pain. MS:  No-   joint pain or swelling.  No- decreased range of motion.  No- back pain. Neuro- grossly normal to observation, Or:  Psych:   No- change in mood or affect. No depression or anxiety.  No memory loss.    Objective:   Physical Exam General- Alert, Oriented, Affect-appropriate, Distress- none acute Skin- rash-none, lesions- none, excoriation- none Lymphadenopathy- none Head- atraumatic            Eyes- Gross vision intact, PERRLA, conjunctivae clear secretions            Ears- Hearing, canals-normal            Nose- Clear, no-Septal dev, mucus, polyps, erosion, perforation             Throat- Mallampati II , mucosa clear , drainage- none, tonsils- atrophic. Mild hoarseness without stridor. Dental repair. Neck- flexible , trachea midline, no stridor , thyroid nl, carotid no bruit Chest - symmetrical excursion , unlabored           Heart/CV- RRR , no murmur , no gallop  , no rub, nl s1 s2                           - JVD- none , edema- none, stasis changes- none, varices- none           Lung- clear to  P&A, wheeze- none, cough- none , dullness-none, rub- none           Chest wall-  Abd- tender-no, distended-no, bowel sounds-present, HSM- no Br/ Gen/ Rectal- Not done, not indicated Extrem- cyanosis- none, clubbing, none, atrophy- none, strength- nl Neuro- grossly intact to observation

## 2011-03-20 NOTE — Patient Instructions (Signed)
Order- CXR- dx COPD  Flu vax  ................................................... Suggest - Because of the hoarseness, try using Advair just once daily for 2 weeks. If you are still hoarse at the end of that time, but not getting tight or wheezy, then stop Advair and see how you feel without it.  Please call as needed.

## 2011-03-21 NOTE — Progress Notes (Signed)
Quick Note:  Pt aware of results. ______ 

## 2011-03-22 NOTE — Assessment & Plan Note (Signed)
She continues to do very well and I gave considerable credit to her regular exercise with square dancing. She has been using Advair twice daily. Since it might be contributing to her hoarseness, I suggested she reduce it to once daily for 2 weeks, then stop if she is stable.

## 2011-08-01 ENCOUNTER — Emergency Department (HOSPITAL_COMMUNITY): Payer: Self-pay | Admitting: Physician Assistant

## 2011-09-18 ENCOUNTER — Encounter: Payer: Self-pay | Admitting: Internal Medicine

## 2011-09-18 ENCOUNTER — Ambulatory Visit (INDEPENDENT_AMBULATORY_CARE_PROVIDER_SITE_OTHER): Payer: Medicare Other | Admitting: Internal Medicine

## 2011-09-18 VITALS — BP 126/68 | HR 63 | Ht 61.0 in | Wt 103.8 lb

## 2011-09-18 DIAGNOSIS — J449 Chronic obstructive pulmonary disease, unspecified: Secondary | ICD-10-CM

## 2011-09-18 NOTE — Patient Instructions (Signed)
Please call as needed 

## 2011-09-18 NOTE — Progress Notes (Signed)
Patient ID: [name], female    DOB: 02/28/1925, 78 y.o.   MRN: [id number]  HPI 62 yoF former smoker followed for COPD with hx CAD and GERD. Last here March 21, 2010 and got through winter without bad problems. Denies any symptoms related to the current pollen season. She comments that she only notes dyspnea if she is square dancing each week. She does not see a change in exercise tolerance there. Occasional dry morning cough. Denies fever, sweats, glands, chest pain or palpitation. She continues Advair 100 twice daily and almost never needs her rescue inhaler.   03/20/11- 47 yoF former smoker followed for COPD with hx CAD and GERD She continues to square dance regularly. Uses Advair, but never needs her rescue inhaler. Aware of some postnasal drip with scant clear mucus. Denies any swallowing problems and says her chest feels "fine". Does note some chronic hoarseness. Chest x-ray 02/20/2010 showed COPD, status post CABG, NAD.  09/18/11- 86 yoF former smoker followed for COPD with hx CAD and GERD  December had "cold" that was going around; has cleared up and no complaints today of SOB, wheezing, cough, or congestion. Not using Advair since last here. Mild morning cough with a little clear phlegm. She is flying to [facility] for her 87th birthday and we discussed the trip. IMPRESSION: CXR 03/21/11 reviewed with pt COPD. No acute cardiopulmonary disease and no change from the  prior study.  Original Report Authenticated By: Camelia Phenes, M.D.   Review of Systems-See HPI Constitutional:   No-   weight loss, night sweats, fevers, chills, fatigue, lassitude. HEENT:   No-  headaches, difficulty swallowing, tooth/dental problems, sore throat,       No-  sneezing, itching, ear ache, nasal congestion,  +post nasal drip,  CV:  No-   chest pain, orthopnea, PND, swelling in lower extremities, anasarca, dizziness, palpitations Resp: No-   shortness of breath with exertion or at rest.    Minimal  productive cough,  No non-productive cough,  No-  coughing up of blood.              No-   change in color of mucus.  No- wheezing.   Skin: No-   rash or lesions. GI:  No-   heartburn, indigestion, abdominal pain, nausea, vomiting,  GU:  MS:  No-   joint pain or swelling.   Neuro- grossly normal to observation, Or:  Psych:  No- change in mood or affect. No depression or anxiety.  No memory loss.  Objective:   Physical Exam General- Alert, Oriented, Affect-appropriate, Distress- none acute, looks well Skin- rash-none, lesions- none, excoriation- none Lymphadenopathy- none Head- atraumatic            Eyes- Gross vision intact, PERRLA, conjunctivae clear secretions            Ears- Hearing, canals-normal            Nose- Clear, no-Septal dev, mucus, polyps, erosion, perforation             Throat- Mallampati II , mucosa clear , drainage- none, tonsils- atrophic. Mild hoarseness without stridor. Dental repair. Neck- flexible , trachea midline, no stridor , thyroid nl, carotid no bruit Chest - symmetrical excursion , unlabored           Heart/CV- RRR , no murmur , no gallop  , no rub, nl s1 s2                           -  JVD- none , edema- none, stasis changes- none, varices- none           Lung- Coarse breath sounds, wheeze- none, cough- none , dullness-none, rub- none           Chest wall-  Abd-  Br/ Gen/ Rectal- Not done, not indicated Extrem- cyanosis- none, clubbing, none, atrophy- none, strength- nl Neuro- grossly intact to observation

## 2011-09-23 NOTE — Assessment & Plan Note (Signed)
She resolves an episode of bronchitis in February but now feels well and is not needing bronchodilators. I don't expect particular problems with her planned plane flight to [facility]. She is comfortable off of inhalers but we can reevaluate if needed.

## 2012-09-18 ENCOUNTER — Ambulatory Visit: Payer: Medicare Other | Admitting: Internal Medicine

## 2012-11-06 ENCOUNTER — Ambulatory Visit (INDEPENDENT_AMBULATORY_CARE_PROVIDER_SITE_OTHER): Payer: Medicare Other | Admitting: Internal Medicine

## 2012-11-06 ENCOUNTER — Ambulatory Visit (INDEPENDENT_AMBULATORY_CARE_PROVIDER_SITE_OTHER)
Admission: RE | Admit: 2012-11-06 | Discharge: 2012-11-06 | Disposition: A | Discharge status: Home / Self Care | Payer: Medicare Other | Source: Ambulatory Visit | Attending: Internal Medicine | Admitting: Internal Medicine

## 2012-11-06 ENCOUNTER — Encounter: Payer: Self-pay | Admitting: Internal Medicine

## 2012-11-06 VITALS — BP 120/68 | HR 65 | Ht 60.0 in | Wt 103.8 lb

## 2012-11-06 DIAGNOSIS — J4489 Other specified chronic obstructive pulmonary disease: Secondary | ICD-10-CM

## 2012-11-06 DIAGNOSIS — J449 Chronic obstructive pulmonary disease, unspecified: Secondary | ICD-10-CM

## 2012-11-06 DIAGNOSIS — J441 Chronic obstructive pulmonary disease with (acute) exacerbation: Secondary | ICD-10-CM

## 2012-11-06 NOTE — Patient Instructions (Addendum)
Order- CXR    Dx COPD  Please call as needed 

## 2012-11-06 NOTE — Progress Notes (Signed)
Patient ID: [name], female    DOB: 1925-02-27, 79 y.o.   MRN: [id number]  HPI 38 yoF former smoker followed for COPD with hx CAD and GERD. Last here March 21, 2010 and got through winter without bad problems. Denies any symptoms related to the current pollen season. She comments that she only notes dyspnea if she is square dancing each week. She does not see a change in exercise tolerance there. Occasional dry morning cough. Denies fever, sweats, glands, chest pain or palpitation. She continues Advair 100 twice daily and almost never needs her rescue inhaler.   03/20/11- 5 yoF former smoker followed for COPD with hx CAD and GERD She continues to square dance regularly. Uses Advair, but never needs her rescue inhaler. Aware of some postnasal drip with scant clear mucus. Denies any swallowing problems and says her chest feels "fine". Does note some chronic hoarseness. Chest x-ray 02/20/2010 showed COPD, status post CABG, NAD.  09/18/11- 85 yoF former smoker followed for COPD with hx CAD and GERD  December had "cold" that was going around; has cleared up and no complaints today of SOB, wheezing, cough, or congestion. Not using Advair since last here. Mild morning cough with a little clear phlegm. She is flying to [facility] for her 87th birthday and we discussed the trip. IMPRESSION: CXR 03/21/11 reviewed with pt COPD. No acute cardiopulmonary disease and no change from the  prior study.  Original Report Authenticated By: Camelia Phenes, M.D.   11/06/12- 49 yoF former smoker followed for COPD with hx CAD and GERD FOLLOWS FOR: slight cough in AM-non productive; denies any SOB, wheezing, or congesiton. Feels well most of the time. Only occasional morning cough. Not requiring any medications.  Review of Systems-See HPI Constitutional:   No-   weight loss, night sweats, fevers, chills, fatigue, lassitude. HEENT:   No-  headaches, difficulty swallowing, tooth/dental problems, sore throat,   No-  sneezing, itching, ear ache, nasal congestion,  +post nasal drip,  CV:  No-   chest pain, orthopnea, PND, swelling in lower extremities, anasarca, dizziness, palpitations Resp: No-   shortness of breath with exertion or at rest.              No- productive cough,  +non-productive cough,  No-  coughing up of blood.              No-   change in color of mucus.  No- wheezing.   Skin: No-   rash or lesions. GI:  No-   heartburn, indigestion, abdominal pain, nausea, vomiting,  GU:  MS:  No-   joint pain or swelling.   Neuro- nothing unusual Psych:  No- change in mood or affect. No depression or anxiety.  No memory loss.  Objective:   Physical Exam General- Alert, Oriented, Affect-appropriate, Distress- none acute, looks well Skin- rash-none, lesions- none, excoriation- none Lymphadenopathy- none Head- atraumatic            Eyes- Gross vision intact, PERRLA, conjunctivae clear secretions            Ears- Hearing, canals-normal            Nose- Clear, no-Septal dev, mucus, polyps, erosion, perforation             Throat- Mallampati II , mucosa clear , drainage- none, tonsils- atrophic. Mild hoarseness without stridor. Dental repair. Neck- flexible , trachea midline, no stridor , thyroid nl, carotid no bruit Chest - symmetrical excursion , unlabored  Heart/CV- RRR , no murmur , no gallop  , no rub, nl s1 s2                           - JVD- none , edema- none, stasis changes- none, varices- none           Lung- +Coarse breath sounds in bases, wheeze- none, cough- none , dullness-none, rub- none           Chest wall-  Abd-  Br/ Gen/ Rectal- Not done, not indicated Extrem- cyanosis- none, clubbing, none, atrophy- none, strength- nl Neuro- grossly intact to observation

## 2012-11-13 ENCOUNTER — Telehealth: Payer: Self-pay | Admitting: Internal Medicine

## 2012-11-13 NOTE — Telephone Encounter (Signed)
Does not sound like a stroke, but if it does not go away, she should have it evaluted by her PCP or go to the ER.  -Dr. Rennis Golden

## 2012-11-13 NOTE — Telephone Encounter (Signed)
Paper chart # [id number] on Dr. Blanchie Dessert cart.

## 2012-11-13 NOTE — Telephone Encounter (Signed)
Returned call.  Pt stated when she woke up this morning she felt really dizzy.  Stated her BP 138/78 and HR 88.  Pt stated her L foot and L arm were numb.  Stated she could move her foot, but it was numb.  Stated she still has some numbness in L foot and L hand.  Denied facial drooping, slurred speech or unsteady gait.  Denied CP or SOB.  BP recheck 113/65 HR 80.  Stated she feels fine if the numbness would go away.  Pt informed RN will discuss with MD/PA for further instructions.  Pt verbalized understanding and agreed w/ plan.  Message forwarded to Dr. Rennis Golden.

## 2012-11-13 NOTE — Telephone Encounter (Signed)
Returned call and informed pt per instructions by MD/PA.  Pt verbalized understanding and agreed w/ plan.  

## 2012-11-13 NOTE — Telephone Encounter (Signed)
Notes Recorded by Waymon Budge, MD on 11/06/2012 at 10:37 PM CXR- lungs over-expanded, seen with asthma and COPD, but no new or active process  I spoke with patient about results and she verbalized understanding and had no questions

## 2012-11-13 NOTE — Telephone Encounter (Signed)
Mrs.Marrufo is calling because she has a numbness in her left side . The numbness does not keep her from moving and heart rate was at 88..   Thanks

## 2012-11-16 NOTE — Assessment & Plan Note (Signed)
She is satisfied with this level of control Plan-update chest x-ray

## 2013-06-22 ENCOUNTER — Encounter: Payer: Self-pay | Admitting: *Deleted

## 2013-06-23 ENCOUNTER — Encounter: Payer: Self-pay | Admitting: Internal Medicine

## 2013-06-23 ENCOUNTER — Ambulatory Visit (INDEPENDENT_AMBULATORY_CARE_PROVIDER_SITE_OTHER): Payer: Medicare Other | Admitting: Internal Medicine

## 2013-06-23 VITALS — BP 120/82 | HR 69 | Ht 60.0 in | Wt 102.0 lb

## 2013-06-23 DIAGNOSIS — R079 Chest pain, unspecified: Secondary | ICD-10-CM

## 2013-06-23 DIAGNOSIS — R55 Syncope and collapse: Secondary | ICD-10-CM | POA: Insufficient documentation

## 2013-06-23 DIAGNOSIS — I2510 Atherosclerotic heart disease of native coronary artery without angina pectoris: Secondary | ICD-10-CM

## 2013-06-23 DIAGNOSIS — R5381 Other malaise: Secondary | ICD-10-CM

## 2013-06-23 DIAGNOSIS — J449 Chronic obstructive pulmonary disease, unspecified: Secondary | ICD-10-CM

## 2013-06-23 DIAGNOSIS — R5383 Other fatigue: Secondary | ICD-10-CM

## 2013-06-23 MED ORDER — NITROGLYCERIN 0.4 MG SL SUBL: 0.4000 mg | SUBLINGUAL_TABLET | SUBLINGUAL | Status: AC | PRN

## 2013-06-23 NOTE — Patient Instructions (Signed)
Your physician has recommended that you wear a holter monitor. Holter monitors are medical devices that record the heart's electrical activity. Doctors most often use these monitors to diagnose arrhythmias. Arrhythmias are problems with the speed or rhythm of the heartbeat. The monitor is a small, portable device. You can wear one while you do your normal daily activities. This is usually used to diagnose what is causing palpitations/syncope (passing out).  You will wear this for 48 hours.  Your physician has requested that you have a lexiscan myoview. For further information please visit [email or web address]. Please follow instruction sheet, as given.  Your physician recommends that you schedule a follow-up appointment in: 3-4 weeks, after your test.

## 2013-06-23 NOTE — Progress Notes (Signed)
OFFICE NOTE  Chief Complaint:  Fatigue, syncope, chest pain  Primary Care Physician: No primary provider on file.  HPI:  [name]  is an 80 year old female with history of coronary artery disease, status post CABG in 2001; also a false positive nuclear stress test in 2007, which showed patent grafts at that time. She does have some shortness of breath with COPD, which is categorized as mild. She has had palpitations from time to time with intermittent tachycardia, for which she takes metoprolol as needed. Recently she's had some central chest discomfort which is substernal and at times somewhat on the right side of her chest. It may last for 30 minutes to an hour and generally goes away on its own. This has been coming more frequently however and she has had recently labile blood pressures as well as faster heart rates, which could indicate arrhythmia. She has not taken her beta blocker medication regularly. Her EKG today shows sinus rhythm however there are lateral T wave inversions. Finally, she reported 2 episodes where she felt an overwhelming weakness and she collapsed to the floor. She says that she maintained consciousness during this event and does remember it however it is certainly concerning for possible syncopal event.  PMHx:  Past Medical History  Diagnosis Date  . CAD (coronary artery disease)   . Chronic airway obstruction, not elsewhere classified     COPD  . Esophageal reflux   . S/P CABG (coronary artery bypass graft) 2001    x4  . Hypertension   . Dyslipidemia   . H/O cardiac arrhythmia     intermittent     Past Surgical History  Procedure Laterality Date  . Replacement total knee bilateral    . Vaginal hysterectomy    . Back surgery    . Nm myocar perf wall motion  10/2005    bruce myoview; mildly abnormal myoview with small focal area of mild qualitative ischemia in apical region, post-stress EF 73%  . Cardiac catheterization  11/13/1989   dilatation of 95% mid LAD obstruction to less than 20% with 2.5 RX balloon and Crodis high torque floppy wire (Dr. Mervyn SkeetersA. Little)   . Cardiac catheterization  11/20/1993    mod disease in LAD with minor irregularities in both Cfx & RCA, normal LV systolic function (Dr. Mervyn SkeetersA. Little)   . Cardiac catheterization  09/20/1997    mild-mod CAD of ostial & prox segments of 1st and 2nd diagonals with minor irregularities of LAD (Dr. Mervyn SkeetersA. Little)   . Cardiac catheterization  01/25/2000    severe single vessel disease with dense calcif in prox postion LAD, extending into bifurcartion of first diagonal, normal LV systolic function, mild renal artery stenosis (Dr. Mervyn SkeetersA. Little)   . Coronary artery bypass graft  01/26/2000    severe 2 vessel CAD; CABGx4 - LIMA to distal LAD, SVG to first diagonal, SVG to second diagonal, SVG to distal RCA (Dr. Molinda Bailiff. [name])   . Cardiac catheterization  08/24/2004    loss of LIMA to LAD, excellent flow to LAD via retrograde filling of diagonals, patent graft to RCA, hepatic artery aneurysm, hyperdynamic LV (Dr. Mervyn SkeetersA. Little)  . Cardiac catheterization  11/29/2005    patent SVG to diagonals & RCA, no significant disease in Cfx system, normal LV to slightly hyperdyanmic LV function, dense calcification of aortic arch and distal aorta (Dr. Langston ReusingA. Little)     FAMHx:  Family History  Problem Relation Age of Onset  . COPD Brother   .  Coronary artery disease Father     SOCHx:   reports that she quit smoking about 14 years ago. Her smoking use included Cigarettes. She has a 40 pack-year smoking history. She has never used smokeless tobacco. She reports that she does not drink alcohol or use illicit drugs.  ALLERGIES:  Allergies  Allergen Reactions  . Codeine     REACTION: nausea  . Penicillins   . Sulfonamide Derivatives     REACTION: nausea    ROS: A comprehensive review of systems was negative except for: Constitutional: positive for fatigue Cardiovascular: positive for chest pain, palpitations  and syncope  HOME MEDS: Current Outpatient Prescriptions  Medication Sig Dispense Refill  . aspirin 81 MG tablet Take 81 mg by mouth daily.       Marland Kitchen ezetimibe-simvastatin (VYTORIN) 10-40 MG per tablet Take 1 tablet by mouth at bedtime.        . Glucosamine-Chondroit-Vit C-Mn (GLUCOSAMINE 1500 COMPLEX) CAPS Take by mouth daily.        Marland Kitchen LORazepam (ATIVAN) 0.5 MG tablet Take 1 tablet by mouth at bedtime as needed.      . Multiple Vitamins-Minerals (OCUVITE-LUTEIN PO) Take 2 tablets by mouth daily.        . nitroGLYCERIN (NITROSTAT) 0.4 MG SL tablet Place 0.4 mg under the tongue every 5 (five) minutes as needed for chest pain.      . nitroGLYCERIN (NITROSTAT) 0.4 MG SL tablet Place 1 tablet (0.4 mg total) under the tongue every 5 (five) minutes as needed for chest pain.  25 tablet  3   No current facility-administered medications for this visit.    LABS/IMAGING: No results found for this or any previous visit (from the past 48 hour(s)). No results found.  VITALS: BP 120/82  Pulse 69  Ht 5' (1.524 m)  Wt 102 lb (46.267 kg)  BMI 19.92 kg/m2  EXAM: General appearance: alert and no distress Neck: no carotid bruit and no JVD Lungs: clear to auscultation bilaterally Heart: regular rate and rhythm, S1, S2 normal, no murmur, click, rub or gallop Abdomen: soft, non-tender; bowel sounds normal; no masses,  no organomegaly Extremities: extremities normal, atraumatic, no cyanosis or edema Pulses: 2+ and symmetric Skin: Skin color, texture, turgor normal. No rashes or lesions Neurologic: Grossly normal Psych: Mood, affect normal  EKG: Normal sinus rhythm at 69, lateral T wave inversions  ASSESSMENT: 1. Chest pain 2. Coronary artery disease status post CABG in 2001 3. Syncope 4. Labile blood pressures and heart rates  PLAN: 1.   [name] is having chest discomfort and has had 2 episodes of syncope which are very unusual for her. Given her coronary artery disease and bypass grafts  which are quite old at this point, I would recommend placing a 48 hour Holter monitor to see if we can capture arrhythmias or perhaps a reason for her intermittent tachycardia-which could be paroxysmal atrial fibrillation or perhaps nonsustained VT. In addition, I would like to obtain another LexiScan nuclear stress test due to her abnormal EKG with ischemic changes as well as her prior coronary artery disease and old bypass grafts dating back to 2001. This is concerning, especially in the setting of intermittent chest pain.  Plan to see her back in a few weeks to discuss the results of the studies.   Chrystie Nose, MD, [facility] Attending Cardiologist [facility]  HILTY,Kenneth C 06/23/2013, 4:59 PM

## 2013-07-01 ENCOUNTER — Ambulatory Visit (HOSPITAL_COMMUNITY)
Admission: RE | Admit: 2013-07-01 | Discharge: 2013-07-01 | Disposition: A | Discharge status: Home / Self Care | Payer: Medicare Other | Source: Ambulatory Visit | Attending: Cardiovascular Disease | Admitting: Cardiovascular Disease

## 2013-07-01 DIAGNOSIS — R55 Syncope and collapse: Secondary | ICD-10-CM

## 2013-07-01 DIAGNOSIS — R079 Chest pain, unspecified: Secondary | ICD-10-CM

## 2013-07-01 MED ORDER — REGADENOSON 0.4 MG/5ML IV SOLN
0.4000 mg | Freq: Once | INTRAVENOUS | Status: AC
  Administered 2013-07-01: 0.4 mg via INTRAVENOUS

## 2013-07-01 MED ORDER — TECHNETIUM TC 99M SESTAMIBI GENERIC - CARDIOLITE
28.9000 | Freq: Once | INTRAVENOUS | Status: AC | PRN
  Administered 2013-07-01: 28.9 via INTRAVENOUS

## 2013-07-01 MED ORDER — TECHNETIUM TC 99M SESTAMIBI GENERIC - CARDIOLITE
10.3000 | Freq: Once | INTRAVENOUS | Status: AC | PRN
  Administered 2013-07-01: 10 via INTRAVENOUS

## 2013-07-01 NOTE — Procedures (Addendum)
[facility] [address] [place] [id number] [phone]  Cardiology Nuclear Med Study  [name] is a 80 y.o. female     MRN : [id number]     DOB: 11/29/1924  Procedure Date: 07/01/2013  Nuclear Med Background Indication for Stress Test:  Evaluation for Ischemia, Graft Patency and Abnormal EKG History:  COPD and CAD;CABG X4--2001 Cardiac Risk Factors: Family History - CAD, History of Smoking, Hypertension and Lipids  Symptoms:  Chest Pain, Dizziness, Fatigue, Light-Headedness, SOB and Syncope   Nuclear Pre-Procedure Caffeine/Decaff Intake:  9:00pm NPO After: 7:00am   IV Site: R Forearm  IV 0.9% NS with Angio Cath:  22g  Chest Size (in):  n/a IV Started by: Emmit PomfretAmanda Hicks, RN  Height: 5' (1.524 m)  Cup Size: n/a  BMI:  Body mass index is 19.92 kg/(m^2). Weight:  102 lb (46.267 kg)   Tech Comments:  n/a    Nuclear Med Study 1 or 2 day study: 1 day  Stress Test Type:  Lexiscan  Order Authorizing Provider:  Zoila ShutterKenneth Hilty, MD   Resting Radionuclide: Technetium 3636m Sestamibi  Resting Radionuclide Dose: 10.3 mCi   Stress Radionuclide:  Technetium 6136m Sestamibi  Stress Radionuclide Dose: 28.9 mCi           Stress Protocol Rest HR: 59 Stress HR: 76  Rest BP: 130/87 Stress BP: 158/95  Exercise Time (min): n/a METS: n/a   Predicted Max HR: 134 bpm % Max HR: 67.16 bpm Rate Pressure Product: [id number]  Dose of Adenosine (mg):  n/a Dose of Lexiscan: 0.4 mg  Dose of Atropine (mg): n/a Dose of Dobutamine: n/a mcg/kg/min (at max HR)  Stress Test Technologist: Esperanza Sheetserry-Marie Martin, CCT Nuclear Technologist: Gonzella LexPam Phillips, CNMT   Rest Procedure:  Myocardial perfusion imaging was performed at rest 45 minutes following the intravenous administration of Technetium 7136m Sestamibi. Stress Procedure:  The patient received IV Lexiscan 0.4 mg over 15-seconds.  Technetium 4936m Sestamibi injected at 30-seconds.  There were no significant  changes with Lexiscan.  Quantitative spect images were obtained after a 45 minute delay.  Transient Ischemic Dilatation (Normal <1.22):  0.99 Lung/Heart Ratio (Normal <0.45):  0.23 QGS EDV:  42 ml QGS ESV:  11 ml LV Ejection Fraction: 74%     Rest ECG: NSR - Normal EKG  Stress ECG: No significant change from baseline ECG and There are scattered PVCs.  QPS Raw Data Images:  Normal; no motion artifact; normal heart/lung ratio. Stress Images:  Normal homogeneous uptake in all areas of the myocardium. Rest Images:  Normal homogeneous uptake in all areas of the myocardium. Subtraction (SDS):  No evidence of ischemia.  Impression Exercise Capacity:  Lexiscan with no exercise. BP Response:  Normal blood pressure response. Clinical Symptoms:  No significant symptoms noted. ECG Impression:  No significant ECG changes with Lexiscan. Comparison with Prior Nuclear Study: previously reported mild apical ischemia is not appreciated on this study  Overall Impression:  Normal stress nuclear study.  LV Wall Motion:  NL LV Function; NL Wall Motion   Cathan Gearin, MD  07/01/2013 1:12 PM

## 2013-07-15 ENCOUNTER — Encounter: Payer: Self-pay | Admitting: Internal Medicine

## 2013-07-15 ENCOUNTER — Ambulatory Visit (INDEPENDENT_AMBULATORY_CARE_PROVIDER_SITE_OTHER): Payer: Medicare Other | Admitting: Internal Medicine

## 2013-07-15 VITALS — BP 114/66 | HR 80 | Ht 60.0 in | Wt 100.0 lb

## 2013-07-15 DIAGNOSIS — R002 Palpitations: Secondary | ICD-10-CM | POA: Insufficient documentation

## 2013-07-15 DIAGNOSIS — R55 Syncope and collapse: Secondary | ICD-10-CM

## 2013-07-15 DIAGNOSIS — R42 Dizziness and giddiness: Secondary | ICD-10-CM

## 2013-07-15 DIAGNOSIS — R079 Chest pain, unspecified: Secondary | ICD-10-CM

## 2013-07-15 MED ORDER — METOPROLOL SUCCINATE ER 25 MG PO TB24: 12.5000 mg | ORAL_TABLET | Freq: Every day | ORAL | Status: AC

## 2013-07-15 NOTE — Progress Notes (Signed)
OFFICE NOTE  Chief Complaint:  Fatigue, syncope, chest pain  Primary Care Physician: Pcp Not In System  HPI:  [name]  is an 80 year old female with history of coronary artery disease, status post CABG in 2001; also a false positive nuclear stress test in 2007, which showed patent grafts at that time. She does have some shortness of breath with COPD, which is categorized as mild. She has had palpitations from time to time with intermittent tachycardia, for which she takes metoprolol as needed. Recently she's had some central chest discomfort which is substernal and at times somewhat on the right side of her chest. It may last for 30 minutes to an hour and generally goes away on its own. This has been coming more frequently however and she has had recently labile blood pressures as well as faster heart rates, which could indicate arrhythmia. She has not taken her beta blocker medication regularly. Her EKG today shows sinus rhythm however there are lateral T wave inversions. Finally, she reported 2 episodes where she felt an overwhelming weakness and she collapsed to the floor. She says that she maintained consciousness during this event and does remember it however it is certainly concerning for possible syncopal event.  [name] returns today for followup of her stress test and monitor. The stress test showed no evidence for vertebral ischemia. Her EF was preserved AT 74%. Her monitor did show isolated PVCs as well as PACs and a short 9 beat run of PAT. There is no evidence of atrial fibrillation or nonsustained ventricular tachycardia during the monitoring period. She has been having symptoms of dizziness which is worse with change of position. This could be representative of an inner ear problem.  PMHx:  Past Medical History  Diagnosis Date  . CAD (coronary artery disease)   . Chronic airway obstruction, not elsewhere classified     COPD  . Esophageal reflux   . S/P CABG  (coronary artery bypass graft) 2001    x4  . Hypertension   . Dyslipidemia   . H/O cardiac arrhythmia     intermittent     Past Surgical History  Procedure Laterality Date  . Replacement total knee bilateral    . Vaginal hysterectomy    . Back surgery    . Nm myocar perf wall motion  10/2005    bruce myoview; mildly abnormal myoview with small focal area of mild qualitative ischemia in apical region, post-stress EF 73%  . Cardiac catheterization  11/13/1989    dilatation of 95% mid LAD obstruction to less than 20% with 2.5 RX balloon and Crodis high torque floppy wire (Dr. Mervyn SkeetersA. Little)   . Cardiac catheterization  11/20/1993    mod disease in LAD with minor irregularities in both Cfx & RCA, normal LV systolic function (Dr. Mervyn SkeetersA. Little)   . Cardiac catheterization  09/20/1997    mild-mod CAD of ostial & prox segments of 1st and 2nd diagonals with minor irregularities of LAD (Dr. Mervyn SkeetersA. Little)   . Cardiac catheterization  01/25/2000    severe single vessel disease with dense calcif in prox postion LAD, extending into bifurcartion of first diagonal, normal LV systolic function, mild renal artery stenosis (Dr. Mervyn SkeetersA. Little)   . Coronary artery bypass graft  01/26/2000    severe 2 vessel CAD; CABGx4 - LIMA to distal LAD, SVG to first diagonal, SVG to second diagonal, SVG to distal RCA (Dr. Molinda Bailiff. [name])   . Cardiac catheterization  08/24/2004  loss of LIMA to LAD, excellent flow to LAD via retrograde filling of diagonals, patent graft to RCA, hepatic artery aneurysm, hyperdynamic LV (Dr. Mervyn Skeeters. Little)  . Cardiac catheterization  11/29/2005    patent SVG to diagonals & RCA, no significant disease in Cfx system, normal LV to slightly hyperdyanmic LV function, dense calcification of aortic arch and distal aorta (Dr. Langston Reusing)     FAMHx:  Family History  Problem Relation Age of Onset  . COPD Brother   . Coronary artery disease Father     SOCHx:   reports that she quit smoking about 14 years ago. Her smoking  use included Cigarettes. She has a 40 pack-year smoking history. She has never used smokeless tobacco. She reports that she does not drink alcohol or use illicit drugs.  ALLERGIES:  Allergies  Allergen Reactions  . Codeine     REACTION: nausea  . Penicillins   . Sulfonamide Derivatives     REACTION: nausea    ROS: A comprehensive review of systems was negative except for: Constitutional: positive for fatigue Cardiovascular: positive for chest pain, palpitations and syncope  HOME MEDS: Current Outpatient Prescriptions  Medication Sig Dispense Refill  . aspirin 81 MG tablet Take 81 mg by mouth daily.       Marland Kitchen ezetimibe-simvastatin (VYTORIN) 10-40 MG per tablet Take 1 tablet by mouth at bedtime.        . Glucosamine-Chondroit-Vit C-Mn (GLUCOSAMINE 1500 COMPLEX) CAPS Take by mouth daily.        Marland Kitchen LORazepam (ATIVAN) 0.5 MG tablet Take 1 tablet by mouth at bedtime as needed.      . Multiple Vitamins-Minerals (OCUVITE-LUTEIN PO) Take 2 tablets by mouth daily.        . nitroGLYCERIN (NITROSTAT) 0.4 MG SL tablet Place 1 tablet (0.4 mg total) under the tongue every 5 (five) minutes as needed for chest pain.  25 tablet  3  . metoprolol succinate (TOPROL-XL) 25 MG 24 hr tablet Take 0.5 tablets (12.5 mg total) by mouth daily.  15 tablet  6   No current facility-administered medications for this visit.    LABS/IMAGING: No results found for this or any previous visit (from the past 48 hour(s)). No results found.  VITALS: BP 114/66  Pulse 80  Ht 5' (1.524 m)  Wt 100 lb (45.36 kg)  BMI 19.53 kg/m2  EXAM: deferred  EKG: deferred  ASSESSMENT: 1. Chest pain - low risk Myoview 2. Coronary artery disease status post CABG in 2001 3. Syncope 4. Palpitations  PLAN: 1.   [name] is having some palpitations and clearly had both ventricular and atrial ectopy on the monitor but no significantly concerning rhythms. She does have indications for beta blocker and I believe with her  intermittent rise in blood pressure and heart rate she would benefit from low-dose beta blockade. I recommend starting Toprol-XL 12.5 mg daily. Hopefully this will help suppress some of her palpitations and decrease the likelihood of her syncopal episodes. After more questioning I am not clear that she had true syncope but perhaps is having significant positional dizziness and perhaps even benign positional vertigo. She may need an evaluation by an ENT.  Chrystie Nose, MD, [facility] Attending Cardiologist [facility]  Norman Bier C 07/15/2013, 11:37 AM

## 2013-07-15 NOTE — Patient Instructions (Signed)
Your physician has recommended you make the following change in your medication: START TOPROL XL 12.5mg  once daily.   Your physician wants you to follow-up in: 6 months. You will receive a reminder letter in the mail two months in advance. If you don't receive a letter, please call our office to schedule the follow-up appointment.

## 2013-08-14 ENCOUNTER — Other Ambulatory Visit: Payer: Self-pay | Admitting: *Deleted

## 2013-08-14 DIAGNOSIS — R55 Syncope and collapse: Secondary | ICD-10-CM

## 2013-08-14 DIAGNOSIS — R079 Chest pain, unspecified: Secondary | ICD-10-CM

## 2013-11-03 ENCOUNTER — Encounter: Payer: Self-pay | Admitting: Internal Medicine

## 2013-11-03 ENCOUNTER — Ambulatory Visit (INDEPENDENT_AMBULATORY_CARE_PROVIDER_SITE_OTHER): Payer: Medicare Other | Admitting: Internal Medicine

## 2013-11-03 VITALS — BP 116/70 | HR 85 | Ht 60.0 in | Wt 98.6 lb

## 2013-11-03 DIAGNOSIS — I2510 Atherosclerotic heart disease of native coronary artery without angina pectoris: Secondary | ICD-10-CM

## 2013-11-03 DIAGNOSIS — J4489 Other specified chronic obstructive pulmonary disease: Secondary | ICD-10-CM

## 2013-11-03 DIAGNOSIS — J449 Chronic obstructive pulmonary disease, unspecified: Secondary | ICD-10-CM

## 2013-11-03 NOTE — Progress Notes (Signed)
Patient ID: [name], female    DOB: 29-Sep-1924, 80 y.o.   MRN: [id number]  HPI [age] yoF former smoker followed for COPD with hx CAD and GERD. Last here March 21, 2010 and got through winter without bad problems. Denies any symptoms related to the current pollen season. She comments that she only notes dyspnea if she is square dancing each week. She does not see a change in exercise tolerance there. Occasional dry morning cough. Denies fever, sweats, glands, chest pain or palpitation. She continues Advair 100 twice daily and almost never needs her rescue inhaler.   03/20/11- [age] yoF former smoker followed for COPD with hx CAD and GERD She continues to square dance regularly. Uses Advair, but never needs her rescue inhaler. Aware of some postnasal drip with scant clear mucus. Denies any swallowing problems and says her chest feels "fine". Does note some chronic hoarseness. Chest x-ray 02/20/2010 showed COPD, status post CABG, NAD.  09/18/11- 85 yoF former smoker followed for COPD with hx CAD and GERD  December had "cold" that was going around; has cleared up and no complaints today of SOB, wheezing, cough, or congestion. Not using Advair since last here. Mild morning cough with a little clear phlegm. [place] 87th [place] and we discussed the trip. IMPRESSION: CXR 03/21/11 reviewed with pt COPD. No acute cardiopulmonary disease and no change from the  prior study.  Original Report Authenticated By: Camelia PhenesAVID C. CLARK, M.D.   11/06/12- [age] yoF former smoker followed for COPD with hx CAD and GERD FOLLOWS FOR: slight cough in AM-non productive; denies any SOB, wheezing, or congesiton. Feels well most of the time. Only occasional morning cough. Not requiring any medications.  11/03/13- [age] yoF former smoker followed for COPD with hx CAD and GERD FOLLOWS FOR: Pt states she is doing well overall; slight cough at times when waking up-non productive.  Companion here Occasional  morning cough but otherwise normal breathing. She works outdoors a lot and has not been needing her breathing medicines. Denies wheezing CXR 11/13/12 IMPRESSION:  Hyperexpanded lungs without acute cardiopulmonary disease.  Original Report Authenticated By: Tacey RuizJohn Watts V, MD  Review of Systems-See HPI Constitutional:   No-   weight loss, night sweats, fevers, chills, fatigue, lassitude. HEENT:   No-  headaches, difficulty swallowing, tooth/dental problems, sore throat,       No-  sneezing, itching, ear ache, nasal congestion, no-post nasal drip,  CV:  No-   chest pain, orthopnea, PND, swelling in lower extremities, anasarca, dizziness, palpitations Resp: No-   shortness of breath with exertion or at rest.              No- productive cough,  +non-productive cough,  No-  coughing up of blood.              No-   change in color of mucus.  No- wheezing.   Skin: No-   rash or lesions. GI:  No-   heartburn, indigestion, abdominal pain, nausea, vomiting,  GU:  MS:  No-   joint pain or swelling.   Neuro- nothing unusual Psych:  No- change in mood or affect. No depression or anxiety.  No memory loss.  Objective:   Physical Exam General- Alert, Oriented, Affect-appropriate, Distress- none acute, looks well Skin- rash-none, lesions- none, excoriation- none Lymphadenopathy- none Head- atraumatic            Eyes- Gross vision intact, PERRLA, conjunctivae clear secretions  Ears- Hearing, canals-normal            Nose- Clear, no-Septal dev, mucus, polyps, erosion, perforation             Throat- Mallampati II , mucosa clear , drainage- none, tonsils- atrophic. Mild hoarseness without stridor. Dental repair. Neck- flexible , trachea midline, no stridor , thyroid nl, carotid no bruit Chest - symmetrical excursion , unlabored           Heart/CV- RRR , no murmur , no gallop  , no rub, nl s1 s2                           - JVD- none , edema- none, stasis changes- none, varices- none            Lung- +fine crackles in bases with inspiration, wheeze- none, cough- none ,                             dullness-none, rub- none           Chest wall-  Abd-  Br/ Gen/ Rectal- Not done, not indicated Extrem- cyanosis- none, clubbing, none, atrophy- none, strength- nl Neuro- grossly intact to observation

## 2013-11-03 NOTE — Patient Instructions (Signed)
If the cough begins to bother you more, please let us know.

## 2013-12-20 NOTE — Assessment & Plan Note (Signed)
Mild chronic bronchitis, intermittent, uncomplicated. Plan-at her age we agreed conservative therapy is appropriate

## 2014-04-11 DEATH — deceased

## 2014-11-03 ENCOUNTER — Ambulatory Visit: Payer: Medicare Other | Admitting: Internal Medicine

## 2019-11-04 ENCOUNTER — Emergency Department
Admission: EM | Admit: 2019-11-04 | Discharge: 2019-11-04 | Disposition: A | Discharge status: Home / Self Care | Payer: Medicare PPO | Attending: Emergency Medicine | Admitting: Emergency Medicine

## 2019-11-04 ENCOUNTER — Other Ambulatory Visit: Payer: Self-pay

## 2019-11-04 ENCOUNTER — Encounter (HOSPITAL_COMMUNITY): Payer: Self-pay

## 2019-11-04 ENCOUNTER — Emergency Department (HOSPITAL_COMMUNITY): Payer: Medicare PPO

## 2019-11-04 DIAGNOSIS — M5430 Sciatica, unspecified side: Secondary | ICD-10-CM | POA: Insufficient documentation

## 2019-11-04 DIAGNOSIS — I714 Abdominal aortic aneurysm, without rupture, unspecified: Secondary | ICD-10-CM

## 2019-11-04 DIAGNOSIS — M4800 Spinal stenosis, site unspecified: Secondary | ICD-10-CM

## 2019-11-04 DIAGNOSIS — M48061 Spinal stenosis, lumbar region without neurogenic claudication: Secondary | ICD-10-CM | POA: Insufficient documentation

## 2019-11-04 HISTORY — DX: Essential (primary) hypertension: I10

## 2019-11-04 HISTORY — DX: Gastro-esophageal reflux disease without esophagitis: K21.9

## 2019-11-04 HISTORY — DX: Chronic obstructive pulmonary disease, unspecified (CMS HCC): J44.9

## 2019-11-04 HISTORY — DX: Arthropathy, unspecified: M12.9

## 2019-11-04 MED ORDER — HYDROCODONE 5 MG-ACETAMINOPHEN 325 MG TABLET
1.00 | ORAL_TABLET | Freq: Four times a day (QID) | ORAL | 0 refills | Status: DC | PRN
Start: 2019-11-04 — End: 2022-08-05

## 2019-11-04 MED ORDER — METHYLPREDNISOLONE 4 MG TABLETS IN A DOSE PACK
ORAL_TABLET | ORAL | 0 refills | Status: DC
Start: 2019-11-04 — End: 2020-10-29

## 2019-11-04 MED ORDER — ONDANSETRON 4 MG DISINTEGRATING TABLET
4.00 mg | ORAL_TABLET | ORAL | Status: AC
Start: 2019-11-04 — End: 2019-11-04
  Administered 2019-11-04: 4 mg via ORAL
  Filled 2019-11-04: qty 1

## 2019-11-04 MED ORDER — PROMETHAZINE 25 MG TABLET
25.00 mg | ORAL_TABLET | ORAL | Status: AC
Start: 2019-11-04 — End: 2019-11-04
  Administered 2019-11-04: 25 mg via ORAL
  Filled 2019-11-04: qty 1

## 2019-11-04 MED ORDER — HYDROMORPHONE (PF) 0.5 MG/0.5 ML INJECTION SYRINGE
0.50 mg | INJECTION | INTRAMUSCULAR | Status: AC
Start: 2019-11-04 — End: 2019-11-04
  Administered 2019-11-04: 0.5 mg via INTRAMUSCULAR
  Filled 2019-11-04: qty 0.5

## 2019-11-04 NOTE — Discharge Instructions (Addendum)
Follow up your primary care provider for recheck and return for increasing symptoms or new, concerning symptoms develop.

## 2019-11-04 NOTE — ED Triage Notes (Signed)
Pt c/o low back pain radiating into right hip and right leg-chronic due to arthritis but worse today. Pt is extremely hard of hearing, family member with pt assisting to answer questions.

## 2019-11-04 NOTE — ED Provider Notes (Signed)
Tricia Bailey  09-25-1924  86 y.o.  female  Tera Partridge, DO    Chief Complaint:   Chief Complaint   Patient presents with   . Low Back Pain     radiating into right hip and down RLE        HPI: This is a 86 y.o. female who presents to the emergency department acute on chronic low back pain.  Has history of chronic low back pain pain is a bit more severe today is with pain radiating down into her right hip denies any new fall or trauma had no fever no vomiting diarrhea no abdominal pain and syncopal episodes no chest pain shortness of breath reported.          [place] Pain Rating Scale     On a scale of 0-10, during the past 24 hours, pain has interfered with you usual activity:       On a scale of 0-10, during the past 24 hours, pain has interfered with your sleep:      On a scale of 0-10, during the past 24 hours, pain has affected your mood:       On a scale of 0-10, during the past 24 hours, pain has contributed to your stress:       On a scale of 0-10, what is your overall pain Rating: 8          Past Medical History:   Past Medical History:   Diagnosis Date   . Arthropathy    . COPD (chronic obstructive pulmonary disease) (CMS HCC)    . GERD (gastroesophageal reflux disease)    . HTN (hypertension)      (Not in an outpatient encounter)     Past Surgical History: History reviewed. No pertinent surgical history.      Home Medications   Cannot display prior to admission medications because the patient has not been admitted in this contact.          Social History:   Social History     Tobacco Use   . Smoking status: Never Smoker   . Smokeless tobacco: Never Used   Vaping Use   . Vaping Use: Never used   Substance Use Topics   . Alcohol use: Never   . Drug use: Never      Social History     Substance and Sexual Activity   Drug Use Never       Allergies:   Allergies   Allergen Reactions   . Cephalexin  Other Adverse Reaction (Add comment)     unknown   . Hctz [Hydrochlorothiazide]  Other Adverse Reaction  (Add comment)     unknown   . Macrodantin [Nitrofurantoin]  Other Adverse Reaction (Add comment)     unknown   . Minocin [Minocycline]  Other Adverse Reaction (Add comment)     unknown         Review of systoms  Constitutional: Negative for fever. Negative for chills.   Skin: Negative for rash or other lesions.   HENT: Negative for headaches.   Eyes: Negative for blurred vision and double vision.   Cardiovascular: Negative for chest pain and palpitations.   Respiratory: Negative for cough. Is not experiencing shortness of breath. No wheezing.  Gastrointestinal: Negative for nausea, vomiting and abdominal pain. No diarrhea, constipation or hematochezia  Genitourinary: Negative for dysuria, urgency, frequency, or hematuria.   Musculoskeletal: Negative for neck pain and back pain.   Neurological: Negative  for dizziness and loss of consciousness.  No numbness, tingling, or other sensation changed.  Psych:  Denies anxiety or depression.  All other systems reviewed and are negative.       BP 126/69   Pulse 77   Temp 35.7 C (96.3 F)   Resp 16   Ht 1.727 m (5\' 8" )   Wt 72.6 kg (160 lb)   SpO2 (!) 89%   BMI 24.33 kg/m         Physical Exam  Vitals and nursing note reviewed.   Constitutional:       Appearance: Normal appearance.   HENT:      Head: Normocephalic and atraumatic.   Cardiovascular:      Rate and Rhythm: Regular rhythm.      Heart sounds: Normal heart sounds.   Pulmonary:      Breath sounds: Normal breath sounds.   Abdominal:      General: Bowel sounds are normal.      Tenderness: There is no abdominal tenderness. There is no guarding.   Musculoskeletal:      Cervical back: Neck supple.      Comments: Tenderness palpation the right lower paralumbar apparent right piriformis tenderness.  Negative straight leg raise test DTRs 2+ and symmetric L4 no footdrop is noted   Skin:     General: Skin is warm and dry.   Neurological:      General: No focal deficit present.      Mental Status: She is alert. Mental  status is at baseline.   Psychiatric:         Behavior: Behavior normal.         The following orders were placed after examining the patient :  Orders Placed This Encounter   . CT LUMBAR SPINE WO IV CONTRAST   . Refer to [facility]   . HYDROmorphone (DILAUDID) 0.5 mg/0.5 mL injection   . ondansetron (ZOFRAN ODT) rapid dissolve tablet   . promethazine (PHENERGAN) tablet   . Methylprednisolone (MEDROL DOSEPACK) 4 mg Oral Tablets, Dose Pack   . HYDROcodone-acetaminophen (NORCO) 5-325 mg Oral Tablet      CT LUMBAR SPINE WO IV CONTRAST   Final Result   Increase in size of abdominal aortic aneurysm.      At L4/L5 there is mild to moderate focal central spinal stenosis.      L5/S1 there is bilateral exit foraminal narrowing.      The no acute compressions. There is good alignment of the lumbar vertebral   bodies.         The CT exam was performed using one or more the following a dose reduction   techniques: Automated exposure control, adjustment of the mA and/or kV   according to the patient's size, or use of iterative reconstruction   technique.      This note was partially created using voice recognition software and is   inherently subject to errors including those of syntax and "sound alike "   substitutions which may escape proof reading.  In such instances, original   meaning may be extrapolated by contextual derivation.         Radiologist location ID: [id number]            No results found for this or any previous visit (from the past 12 hour(s)).      ED Course:   ED Course as of Nov 03 1308   Wed Nov 04, 2019   1304 Neurologically  intact.  Afebrile no bowel or bladder dysfunction.  No peritoneal signs.  Reviewed findings of the workup with the patient and family.  No treat acute on chronic low back pain period was on here osteoarthritis and spinal stenosis.  NG degenerative disc disease.  Also discussed incidental her aortic aneurysm of 3.7 cm.  A place referral and to vascular surgery for  further evaluation.  If the patient family decides he would like to proceed with the evaluation.      [AC]      ED Course User Index  [AC] Avel Sensor, DO      Procedures                   Findigns and diagnosis discussed with patient.  Clinical Impression:   Encounter Diagnoses   Name Primary?   . Sciatica Yes   . Spinal stenosis    . AAA (abdominal aortic aneurysm) (CMS HCC)        Critical Care Time:  Total critical care time spent in direct care of this patient at high risk based on presenting history/exam/and complaint, including initial evaluation and stabilization, review of data, re-examination, discussion with admitting and consulting services to arrange definitive care, and exclusive of any procedures performed,      Disposition: Discharged  New Prescriptions    HYDROCODONE-ACETAMINOPHEN (NORCO) 5-325 MG ORAL TABLET    Take 1 Tablet by mouth Every 6 hours as needed for Pain    METHYLPREDNISOLONE (MEDROL DOSEPACK) 4 MG ORAL TABLETS, DOSE PACK    Take as instructed.      Naomie Dean, DO  [address]  [address]  [place] [address]  [phone]    Schedule an appointment as soon as possible for a visit in 2 days  If symptoms worsen    [facility]  [address]  [place] [address]  [phone]    As needed     Avel Sensor, DO       This note was partially created using voice recognition software and is inherently subject to errors including those of syntax and "sound alike " substitutions which may escape proof reading.  In such instances, original meaning may be extrapolated by contextual derivation.

## 2019-11-04 NOTE — ED Nurses Note (Signed)
Dr Adriana Simas in to speak with patient and family. Discharge instructions and rx given. Patient and family voiced understanding. Alert. Stable. Discharged home with family driving. To vehicle via wheelchair.

## 2020-10-29 ENCOUNTER — Emergency Department (HOSPITAL_COMMUNITY): Payer: Medicare PPO

## 2020-10-29 ENCOUNTER — Emergency Department
Admission: EM | Admit: 2020-10-29 | Discharge: 2020-10-29 | Disposition: A | Discharge status: Home / Self Care | Payer: Medicare PPO | Attending: Emergency Medicine | Admitting: Emergency Medicine

## 2020-10-29 ENCOUNTER — Encounter (HOSPITAL_COMMUNITY): Payer: Self-pay

## 2020-10-29 ENCOUNTER — Other Ambulatory Visit: Payer: Self-pay

## 2020-10-29 DIAGNOSIS — U071 COVID-19: Secondary | ICD-10-CM | POA: Insufficient documentation

## 2020-10-29 DIAGNOSIS — I10 Essential (primary) hypertension: Secondary | ICD-10-CM | POA: Insufficient documentation

## 2020-10-29 DIAGNOSIS — J449 Chronic obstructive pulmonary disease, unspecified: Secondary | ICD-10-CM | POA: Insufficient documentation

## 2020-10-29 DIAGNOSIS — H9190 Unspecified hearing loss, unspecified ear: Secondary | ICD-10-CM | POA: Insufficient documentation

## 2020-10-29 DIAGNOSIS — L989 Disorder of the skin and subcutaneous tissue, unspecified: Secondary | ICD-10-CM | POA: Insufficient documentation

## 2020-10-29 LAB — CBC WITH DIFF
BASOPHIL #: <0.1 10*3/uL (ref <=0.20)
BASOPHIL %: 0 %
EOSINOPHIL #: <0.1 10*3/uL (ref <=0.50)
EOSINOPHIL %: 0 %
HCT: 36.4 % (ref 34.8–46.0)
HGB: 11.3 g/dL — ABNORMAL LOW (ref 11.5–16.0)
IMMATURE GRANULOCYTE #: <0.1 10*3/uL (ref <0.10)
IMMATURE GRANULOCYTE %: 0 % (ref 0–1)
LYMPHOCYTE #: 1.54 10*3/uL (ref 1.00–4.80)
LYMPHOCYTE %: 31 %
MCH: 24.1 pg — ABNORMAL LOW (ref 26.0–32.0)
MCHC: 31 g/dL (ref 31.0–35.5)
MCV: 77.8 fL — ABNORMAL LOW (ref 78.0–100.0)
MONOCYTE #: 0.61 10*3/uL (ref 0.20–1.10)
MONOCYTE %: 12 %
MPV: 11.2 fL (ref 8.7–12.5)
NEUTROPHIL #: 2.78 10*3/uL (ref 1.50–7.70)
NEUTROPHIL %: 57 %
PLATELETS: 145 10*3/uL — ABNORMAL LOW (ref 150–400)
RBC: 4.68 10*6/uL (ref 3.85–5.22)
RDW-CV: 17.2 % — ABNORMAL HIGH (ref 11.5–15.5)
WBC: 5 10*3/uL (ref 3.7–11.0)

## 2020-10-29 LAB — COMPREHENSIVE METABOLIC PANEL, NON-FASTING
ALBUMIN: 3.6 g/dL (ref 3.4–4.8)
ALKALINE PHOSPHATASE: 101 U/L (ref 55–145)
ALT (SGPT): 49 U/L — ABNORMAL HIGH (ref 8–22)
ANION GAP: 9 mmol/L (ref 4–13)
AST (SGOT): 63 U/L — ABNORMAL HIGH (ref 8–45)
BILIRUBIN TOTAL: 0.6 mg/dL (ref 0.3–1.3)
BUN/CREA RATIO: 21 (ref 6–22)
BUN: 27 mg/dL — ABNORMAL HIGH (ref 8–25)
CALCIUM: 8.9 mg/dL (ref 8.8–10.2)
CHLORIDE: 103 mmol/L (ref 96–111)
CO2 TOTAL: 26 mmol/L (ref 23–31)
CREATININE: 1.28 mg/dL — ABNORMAL HIGH (ref 0.60–1.05)
ESTIMATED GFR: 36 mL/min/BSA — ABNORMAL LOW (ref >=60)
GLUCOSE: 104 mg/dL (ref 65–125)
POTASSIUM: 4.7 mmol/L (ref 3.5–5.1)
PROTEIN TOTAL: 7.1 g/dL (ref 6.0–8.0)
SODIUM: 138 mmol/L (ref 136–145)

## 2020-10-29 LAB — COVID-19, FLU A/B, RSV RAPID BY PCR
INFLUENZA VIRUS TYPE A: NOT DETECTED
INFLUENZA VIRUS TYPE B: NOT DETECTED
RESPIRATORY SYNCTIAL VIRUS (RSV): NOT DETECTED
SARS-CoV-2: DETECTED — AB

## 2020-10-29 LAB — TROPONIN-I: TROPONIN I: 11 ng/L (ref 7–30)

## 2020-10-29 LAB — LACTIC ACID LEVEL W/ REFLEX FOR LEVEL >2.0: LACTIC ACID: 1.2 mmol/L (ref 0.5–2.2)

## 2020-10-29 LAB — PT/INR
INR: 1.1 (ref 0.80–4.00)
PROTHROMBIN TIME: 12.4 seconds (ref 9.4–12.5)

## 2020-10-29 MED ORDER — PAXLOVID 300 MG (150 MG X 2)-100 MG TABLETS IN A DOSE PACK (EUA)
3.00 | ORAL_TABLET | Freq: Two times a day (BID) | ORAL | 0 refills | Status: AC
Start: 2020-10-29 — End: 2020-11-03

## 2020-10-29 MED ORDER — BENZONATATE 200 MG CAPSULE
200.00 mg | ORAL_CAPSULE | Freq: Three times a day (TID) | ORAL | 0 refills | Status: DC | PRN
Start: 2020-10-29 — End: 2021-05-09

## 2020-10-29 NOTE — ED Nurses Note (Signed)
Port CXR obtained per tech.

## 2020-10-29 NOTE — Discharge Instructions (Addendum)
Take the Paxlovid (antiviral) as directed.    Do NOT take your hydrocodone-acetaminophen while you are taking the Paxlovid.    Take the benzonatate for cough.    Return to the emergency department for worsening troubles breathing or other concerns.    Please talk to your doctor about a biopsy of the mole on your leg, this may be cancer.

## 2020-10-29 NOTE — ED Provider Notes (Signed)
Emergency Medicine  Attending Only Note    Name: Tricia Bailey  Age and Gender: 87 y.o. female  Date of Birth: 1925-01-27  MRN: [id number]  PCP: Naomie Dean, DO    CC:  Chief Complaint   Patient presents with   . Shortness of Breath     Exposed to Covid at church on Sunday   . Chest Pain        HPI:  Tricia Bailey is a 87 y.o. White female with history of HTN, COPD who presents with cough and shortness of breath over the last 3 days.  Was at church last weekend that was closed down this week due to a COVID outbreak.  No chest pain.  She does mention a mole to the back of her left leg that has been irritated that she asks me to look at.  Limited further history as patient is especially hard of hearing.     Pain Rating Scale     On a scale of 0-10, what is your overall pain Rating: 0        ROS:  Constitutional: No Fever and chills.  No weakness.   Skin: No rash or diaphoresis.  HENT: No headache or congestion.  Eyes: No vision changes or photophobia.  Cardio: No chest pain, palpitations or leg swelling.  Respiratory: +Cough and shortness of breath.  No wheezing.  GI:  No nausea, vomiting or stool changes.  GU:  No dysuria, hematuria, or increased frequency.  MSK: No myalgias.  No joint or back pain.  Neuro: No seizures, LOC, numbness, tingling, or focal weakness.  Psychiatric: No depression, SI or illicit substance abuse.      All other systems reviewed and are negative.    Below pertinent information reviewed with patient:  Past Medical History:   Diagnosis Date   . Arthropathy    . COPD (chronic obstructive pulmonary disease) (CMS HCC)    . GERD (gastroesophageal reflux disease)    . HTN (hypertension)            Medications Prior to Admission     Prescriptions    albuterol sulfate (PROVENTIL OR VENTOLIN OR PROAIR) 90 mcg/actuation Inhalation HFA Aerosol Inhaler    Take 1-2 Puffs by inhalation Every 6 hours as needed    amLODIPine (NORVASC) 2.5 mg Oral Tablet    Take 2.5 mg by mouth Twice daily     cyclobenzaprine (FLEXERIL) 10 mg Oral Tablet    Take 10 mg by mouth Three times a day    HYDROcodone-acetaminophen (NORCO) 5-325 mg Oral Tablet    Take 1 Tablet by mouth Every 6 hours as needed for Pain    levothyroxine (SYNTHROID) 25 mcg Oral Tablet    Take 25 mcg by mouth Every morning Unsure of dose    nebivoloL (BYSTOLIC) 10 mg Oral Tablet    Take 10 mg by mouth Twice daily    omeprazole (PRILOSEC) 20 mg Oral Capsule, Delayed Release(E.C.)    Take 20 mg by mouth Once a day    promethazine (PHENERGAN) 25 mg Oral Tablet    Take 25 mg by mouth Every 6 hours as needed for Nausea/Vomiting          Allergies   Allergen Reactions   . Cephalexin  Other Adverse Reaction (Add comment)     unknown   . Hctz [Hydrochlorothiazide]  Other Adverse Reaction (Add comment)     unknown   . Macrodantin [Nitrofurantoin]  Other Adverse Reaction (Add  comment)     unknown   . Minocin [Minocycline]  Other Adverse Reaction (Add comment)     unknown         No family history on file.    Objective:    ED Triage Vitals [10/29/20 1206]   BP (Non-Invasive) (!) 160/79   Heart Rate 63   Respiratory Rate 20   Temperature 37.2 C (99 F)   SpO2 94 %   Weight    Height      Filed Vitals:    10/29/20 1206 10/29/20 1213   BP: (!) 160/79    Pulse: 63 65   Resp: 20 (!) 25   Temp: 37.2 C (99 F)    SpO2: 94% 95%       Nursing notes and vital signs reviewed.    Constitutional - No acute distress.  Alert and Active.  HEENT - Normocephalic. Atraumatic. PERRL. EOMI. Conjunctiva clear. Oropharynx with no erythema, lesions, or exudates. Moist mucous membranes.   Neck - Trachea midline. No stridor. No hoarseness.  Cardiac - Regular rate and rhythm. Systolic ejection murmur.  Respiratory - Rare crackle, otherwise clear to auscultation bilaterally. No wheezes or rhonchi.  Abdomen - Non-tender, soft, non-distended. No rebound or guarding.   Musculoskeletal - Good AROM. No muscle or joint tenderness appreciated. No clubbing, cyanosis or edema.  Skin - Warm and  dry, without any rashes.  Complex nevus to left posterior thigh with ulcerated center.  Neuro - Cranial nerves II-XII are grossly intact.  Moving all extremities symmetrically.  Psych - Alert and oriented.  Answers questions appropriately.    Any pertinent labs and imaging obtained during this encounter reviewed below in MDM.    MDM/ED Course:  Tests positive for covid which explains her symptoms today.  Fortunately is doing well, no significant hypoxia, breathing comfortably.  CXR clear.  Has mildly elevated creatinine although no baseline to compare against.  Normal troponin.  Minimally elevated liver enzymes not uncommon with covid infection.  No indication for admission at this time, great candidate for Paxlovid.  Checked medication interactions, will have her hold hydrocodone otherwise okay.    Will have her f/u with PCP for excision or referral to lesion on thigh, likely malignancy.      Orders Placed This Encounter   . ADULT ROUTINE BLOOD CULTURE, SET OF 2 ADULT BOTTLES (BACTERIA AND YEAST)   . ADULT ROUTINE BLOOD CULTURE, SET OF 2 ADULT BOTTLES (BACTERIA AND YEAST)   . XR AP MOBILE CHEST   . CBC/DIFF   . PT/INR   . COMPREHENSIVE METABOLIC PANEL, NON-FASTING   . TROPONIN-I   . CBC WITH DIFF   . COVID-19, FLU A/B, RSV RAPID BY PCR   . LACTIC ACID LEVEL W/ REFLEX FOR LEVEL >2.0   . ECG 12-LEAD   . INSERT & MAINTAIN PERIPHERAL IV ACCESS   . nirmatrelvir-ritonavir (PAXLOVID, EUA,) 150 mg x 2- 100 mg Oral Tablet   . Benzonatate (TESSALON) 200 mg Oral Capsule       Any procedures:  Procedures    Impression:   Encounter Diagnoses   Name Primary?   . COVID-19 Yes   . Skin lesion          Disposition: Discharged      Discharge Instructions Given to Patient:  Take the Paxlovid (antiviral) as directed.    Do NOT take your hydrocodone-acetaminophen while you are taking the Paxlovid.    Take the benzonatate for cough.    Return to  the emergency department for worsening troubles breathing or other concerns.    Please talk  to your doctor about a biopsy of the mole on your leg, this may be cancer.    Portions of this note may have been dictated using voice recognition software.     Truddie Coco, MD  [facility] Department of Emergency Medicine    -----------------------  Results for orders placed or performed during the hospital encounter of 10/29/20 (from the past 12 hour(s))   PT/INR   Result Value Ref Range    PROTHROMBIN TIME 12.4 9.4 - 12.5 seconds    INR 1.10 0.80 - 4.00   COMPREHENSIVE METABOLIC PANEL, NON-FASTING   Result Value Ref Range    SODIUM 138 136 - 145 mmol/L    POTASSIUM 4.7 3.5 - 5.1 mmol/L    CHLORIDE 103 96 - 111 mmol/L    CO2 TOTAL 26 23 - 31 mmol/L    ANION GAP 9 4 - 13 mmol/L    BUN 27 (H) 8 - 25 mg/dL    CREATININE 7.61 (H) 0.60 - 1.05 mg/dL    BUN/CREA RATIO 21 6 - 22    ESTIMATED GFR 36 (L) >=60 mL/min/BSA    ALBUMIN 3.6 3.4 - 4.8 g/dL     CALCIUM 8.9 8.8 - 95.0 mg/dL    GLUCOSE 932 65 - 671 mg/dL    ALKALINE PHOSPHATASE 101 55 - 145 U/L    ALT (SGPT) 49 (H) 8 - 22 U/L    AST (SGOT)  63 (H) 8 - 45 U/L    BILIRUBIN TOTAL 0.6 0.3 - 1.3 mg/dL    PROTEIN TOTAL 7.1 6.0 - 8.0 g/dL   TROPONIN-I   Result Value Ref Range    TROPONIN I 11 7 - 30 ng/L   CBC WITH DIFF   Result Value Ref Range    WBC 5.0 3.7 - 11.0 x10^3/uL    RBC 4.68 3.85 - 5.22 x10^6/uL    HGB 11.3 (L) 11.5 - 16.0 g/dL    HCT 24.5 80.9 - 98.3 %    MCV 77.8 (L) 78.0 - 100.0 fL    MCH 24.1 (L) 26.0 - 32.0 pg    MCHC 31.0 31.0 - 35.5 g/dL    RDW-CV 38.2 (H) 50.5 - 15.5 %    PLATELETS 145 (L) 150 - 400 x10^3/uL    MPV 11.2 8.7 - 12.5 fL    NEUTROPHIL % 57 %    LYMPHOCYTE % 31 %    MONOCYTE % 12 %    EOSINOPHIL % 0 %    BASOPHIL % 0 %    NEUTROPHIL # 2.78 1.50 - 7.70 x10^3/uL    LYMPHOCYTE # 1.54 1.00 - 4.80 x10^3/uL    MONOCYTE # 0.61 0.20 - 1.10 x10^3/uL    EOSINOPHIL # <0.10 <=0.50 x10^3/uL    BASOPHIL # <0.10 <=0.20 x10^3/uL    IMMATURE GRANULOCYTE % 0 0 - 1 %    IMMATURE GRANULOCYTE # <0.10 <0.10 x10^3/uL   COVID-19, FLU A/B, RSV RAPID  BY PCR   Result Value Ref Range    SARS-CoV-2 Detected (A) Not Detected    INFLUENZA VIRUS TYPE A Not Detected Not Detected    INFLUENZA VIRUS TYPE B Not Detected Not Detected    RESPIRATORY SYNCTIAL VIRUS (RSV) Not Detected Not Detected   LACTIC ACID LEVEL W/ REFLEX FOR LEVEL >2.0   Result Value Ref Range    LACTIC ACID 1.2 0.5 - 2.2 mmol/L  XR AP MOBILE CHEST   Final Result   No acute cardiopulmonary process.                        Radiologist location ID: [id number]

## 2020-10-29 NOTE — ED Nurses Note (Signed)
To ED 6 for triage and exam. Gown on. Monitor applied. Side rails up x 2. Call bell provided. Covid swab collected. 18g PIV to left forearm x 1 attempt per Arlyss Repress, RN. Labs drawn. Flushed easily. EKG obtained per myself. Dr Marcha Dutton in to examine patient. Family at bedside.

## 2020-10-29 NOTE — ED Triage Notes (Signed)
Short of breath and cough  Onset 3 days ago  Chest pain - midsternal pain; intermittent heaviness  Non-radiating  No ASA today

## 2020-10-29 NOTE — ED Nurses Note (Signed)
Declined wheelchair transport. Ambulated out without incidence

## 2020-10-30 LAB — ECG 12-LEAD
Atrial Rate: 64 {beats}/min
Calculated P Axis: 72 degrees
Calculated R Axis: -21 degrees
Calculated T Axis: 46 degrees
PR Interval: 190 ms
QRS Duration: 72 ms
QT Interval: 414 ms
QTC Calculation: 427 ms
Ventricular rate: 64 {beats}/min

## 2020-11-03 LAB — ADULT ROUTINE BLOOD CULTURE, SET OF 2 BOTTLES (BACTERIA AND YEAST)
BLOOD CULTURE, ROUTINE: NO GROWTH
BLOOD CULTURE, ROUTINE: NO GROWTH

## 2021-05-09 ENCOUNTER — Emergency Department (HOSPITAL_COMMUNITY): Payer: Medicare PPO

## 2021-05-09 ENCOUNTER — Other Ambulatory Visit: Payer: Self-pay

## 2021-05-09 ENCOUNTER — Emergency Department
Admission: EM | Admit: 2021-05-09 | Discharge: 2021-05-09 | Disposition: A | Discharge status: Home / Self Care | Payer: Medicare PPO | Attending: Physician Assistant | Admitting: Physician Assistant

## 2021-05-09 ENCOUNTER — Encounter (HOSPITAL_COMMUNITY): Payer: Self-pay

## 2021-05-09 DIAGNOSIS — Z1152 Encounter for screening for COVID-19: Secondary | ICD-10-CM

## 2021-05-09 DIAGNOSIS — J069 Acute upper respiratory infection, unspecified: Secondary | ICD-10-CM | POA: Insufficient documentation

## 2021-05-09 DIAGNOSIS — Z20822 Contact with and (suspected) exposure to covid-19: Secondary | ICD-10-CM | POA: Insufficient documentation

## 2021-05-09 LAB — BASIC METABOLIC PANEL
ANION GAP: 10 mmol/L (ref 4–13)
BUN/CREA RATIO: 20 (ref 6–22)
BUN: 25 mg/dL (ref 8–25)
CALCIUM: 9.5 mg/dL (ref 8.8–10.2)
CHLORIDE: 106 mmol/L (ref 96–111)
CO2 TOTAL: 23 mmol/L (ref 23–31)
CREATININE: 1.23 mg/dL — ABNORMAL HIGH (ref 0.60–1.05)
ESTIMATED GFR: 41 mL/min/BSA — ABNORMAL LOW (ref >=60)
GLUCOSE: 115 mg/dL (ref 65–125)
POTASSIUM: 4.3 mmol/L (ref 3.5–5.1)
SODIUM: 139 mmol/L (ref 136–145)

## 2021-05-09 LAB — URINALYSIS, MICROSCOPIC

## 2021-05-09 LAB — URINALYSIS, MACROSCOPIC
BILIRUBIN: NEGATIVE mg/dL
GLUCOSE: NEGATIVE mg/dL
KETONES: NEGATIVE mg/dL
LEUKOCYTES: NEGATIVE WBCs/uL
NITRITE: NEGATIVE
PH: 6 (ref 5.0–8.5)
PROTEIN: NEGATIVE mg/dL
SPECIFIC GRAVITY: 1.02 (ref 1.005–1.030)
UROBILINOGEN: 0.2 mg/dL

## 2021-05-09 LAB — GOLD TOP TUBE

## 2021-05-09 LAB — CBC WITH DIFF
BASOPHIL #: <0.1 10*3/uL (ref <=0.20)
BASOPHIL %: 0 %
EOSINOPHIL #: <0.1 10*3/uL (ref <=0.50)
EOSINOPHIL %: 0 %
HCT: 37.6 % (ref 34.8–46.0)
HGB: 12.4 g/dL (ref 11.5–16.0)
IMMATURE GRANULOCYTE #: <0.1 10*3/uL (ref <0.10)
IMMATURE GRANULOCYTE %: 0 % (ref 0–1)
LYMPHOCYTE #: 1.46 10*3/uL (ref 1.00–4.80)
LYMPHOCYTE %: 21 %
MCH: 25.4 pg — ABNORMAL LOW (ref 26.0–32.0)
MCHC: 33 g/dL (ref 31.0–35.5)
MCV: 77 fL — ABNORMAL LOW (ref 78.0–100.0)
MONOCYTE #: 0.88 10*3/uL (ref 0.20–1.10)
MONOCYTE %: 13 %
MPV: 11.5 fL (ref 8.7–12.5)
NEUTROPHIL #: 4.55 10*3/uL (ref 1.50–7.70)
NEUTROPHIL %: 66 %
PLATELETS: 158 10*3/uL (ref 150–400)
RBC: 4.88 10*6/uL (ref 3.85–5.22)
RDW-CV: 15.9 % — ABNORMAL HIGH (ref 11.5–15.5)
WBC: 7 10*3/uL (ref 3.7–11.0)

## 2021-05-09 LAB — BLUE TOP TUBE

## 2021-05-09 LAB — COVID-19, FLU A/B, RSV RAPID BY PCR - LAB USE ONLY
INFLUENZA VIRUS TYPE A: NOT DETECTED
INFLUENZA VIRUS TYPE B: NOT DETECTED
RESPIRATORY SYNCTIAL VIRUS (RSV): NOT DETECTED
SARS-CoV-2: NOT DETECTED

## 2021-05-09 LAB — GRAY TOP TUBE

## 2021-05-09 MED ORDER — PROMETHAZINE-DM 6.25 MG-15 MG/5 ML ORAL SYRUP
5.0000 mL | ORAL_SOLUTION | Freq: Four times a day (QID) | ORAL | 0 refills | Status: DC | PRN
Start: 2021-05-09 — End: 2021-11-07

## 2021-05-09 NOTE — ED Triage Notes (Signed)
Short of breath and coughing, sore throat and fever since Sunday. I think I broke my fever yesterday evening

## 2021-05-09 NOTE — ED Provider Notes (Signed)
[facility]  Emergency Department  Provider Note      HAZELEE HARBOLD  July 02, 1924  87 y.o.  female  [place] [address]   [phone] (home)  Tera Partridge, DO    Chief Complaint:   Chief Complaint   Patient presents with   . Shortness of Breath     Since sunday       HPI: This is a 87 y.o. female who presents to the emergency department via POV with complaint of nonproductive cough, shortness of breath, sore throat and low-grade fever that started yesterday.  She is unsure of how high her fever was she does not have a thermometer, but she just felt hot.  She denies nausea, vomiting or diarrhea.  She has had no sick exposures.  She has not received her influenza vaccine or COVID boosters.    Past Medical History:   Past Medical History:   Diagnosis Date   . Arthropathy    . COPD (chronic obstructive pulmonary disease) (CMS HCC)    . GERD (gastroesophageal reflux disease)    . HTN (hypertension)        Past Surgical History: History reviewed. No pertinent surgical history.    Social History:   Social History     Tobacco Use   . Smoking status: Never   . Smokeless tobacco: Never   Vaping Use   . Vaping Use: Never used   Substance Use Topics   . Alcohol use: Never   . Drug use: Never      Social History     Substance and Sexual Activity   Drug Use Never       Allergies:   Allergies   Allergen Reactions   . Cephalexin  Other Adverse Reaction (Add comment)     unknown   . Hctz [Hydrochlorothiazide]  Other Adverse Reaction (Add comment)     unknown   . Macrodantin [Nitrofurantoin]  Other Adverse Reaction (Add comment)     unknown   . Minocin [Minocycline]  Other Adverse Reaction (Add comment)     unknown         Medications: (Not in an outpatient encounter)       Review of Systems   Constitutional: Positive for chills, fever and malaise/fatigue.   HENT: Positive for sore throat.    Eyes: Negative.    Respiratory: Positive for cough and shortness of breath.    Cardiovascular: Negative.     Gastrointestinal: Negative.    Genitourinary: Negative.    Musculoskeletal: Negative.    Skin: Negative.    Neurological: Negative.    Endo/Heme/Allergies: Negative.    Psychiatric/Behavioral: Negative.          ED Triage Vitals [05/09/21 1207]   BP (Non-Invasive) (!) 162/75   Heart Rate 72   Respiratory Rate (!) 22   Temperature 36.8 C (98.2 F)   SpO2 98 %   Weight 72.6 kg (160 lb)   Height 1.727 m ('5\' 8"' )       Physical Exam  Vitals and nursing note reviewed. Exam conducted with a chaperone present.   Constitutional:       General: She is not in acute distress.     Appearance: Normal appearance. She is not ill-appearing, toxic-appearing or diaphoretic.   HENT:      Head: Normocephalic and atraumatic.      Right Ear: Tympanic membrane normal.      Left Ear: Tympanic membrane normal.      Nose:  Rhinorrhea present.      Mouth/Throat:      Mouth: Mucous membranes are moist.      Pharynx: Oropharynx is clear.   Eyes:      Extraocular Movements: Extraocular movements intact.      Conjunctiva/sclera: Conjunctivae normal.      Pupils: Pupils are equal, round, and reactive to light.   Cardiovascular:      Rate and Rhythm: Normal rate and regular rhythm.      Pulses: Normal pulses.      Heart sounds: Normal heart sounds.   Pulmonary:      Effort: Pulmonary effort is normal.      Breath sounds: Normal breath sounds.   Abdominal:      General: Bowel sounds are normal.      Palpations: Abdomen is soft.   Musculoskeletal:         General: Normal range of motion.      Cervical back: Normal range of motion and neck supple.   Skin:     General: Skin is warm and dry.      Capillary Refill: Capillary refill takes less than 2 seconds.   Neurological:      General: No focal deficit present.      Mental Status: She is alert and oriented to person, place, and time.   Psychiatric:         Mood and Affect: Mood normal.         Behavior: Behavior normal.           Labs:   No results found for this or any previous visit (from the past  12 hour(s)).    I have reviewed all labs ordered.  See course.    Imaging:  XR AP MOBILE CHEST   Final Result by Edi, Radresults In (11/29 1350)      No acute cardiopulmonary process.         JLF               Radiologist location ID: [id number]             I have seen and reviewed all radiology images ordered. See course.    ED Course/ MDM/ Plan:   Patient was triaged, vital signs were obtained, patient was  placed in a room.  On exam, patient is alert and oriented, nontoxic on exam, and in no acute distress. Vitals were reviewed.  Patient is afebrile.  She is not hypoxic, tachycardic or hypotensive.  Work-up ordered, which is unremarkable.  Patient has a viral upper respiratory infection and recommend symptomatic treatment.  Patient was given a prescription for Phenergan DM every 4-6 hours as needed for cough. Encouraged fluids, Tylenol and Motrin as needed for fever pain and body aches.  Recommend follow-up with her PCP and is always return to the ED if symptoms get worse or do not improve.  Patient remained stable and suitable for discharge.    ED Course as of 05/12/21 1632   Tue May 09, 2021   1356 CBC/DIFF(!)  Unremarkable    1357 XR AP MOBILE CHEST  No acute cardiopulmonary process.   8295 BASIC METABOLIC PANEL(!)  Creatinine 1.23, eGFR 41, patient's baseline   1417 URINALYSIS, MACROSCOPIC AND MICROSCOPIC(!)  Trace of blood, otherwise unremarkable    1606 COVID-19, FLU A/B, RSV RAPID BY PCR - LAB USE ONLY  Negative              Procedures  None  Clinical Impression:   Encounter Diagnoses   Name Primary?   . Encounter for screening for COVID-19 Yes   . Viral upper respiratory tract infection            Disposition: Discharged  Discharge Medication List as of 05/09/2021  4:03 PM      START taking these medications    Details   promethazine-dextromethorphan (PHENERGAN-DM) 6.25-15 mg/5 mL Oral Syrup Take 5 mL by mouth Four times a day as needed for Cough, Disp-100 mL, R-0, E-Rx            Tera Partridge, DO  [address]  [address] [place] [address]  [phone]    In 2 days      [facility]  [place] [address]  [phone]    As needed, If symptoms worsen     BP 133/71   Pulse 65   Temp 36.9 C (98.4 F)   Resp 18   Ht 1.727 m ('5\' 8"' )   Wt 72.6 kg (160 lb)   SpO2 96%   BMI 24.33 kg/m          Caffie Pinto, PA-C       This note was partially created using voice recognition software and is inherently subject to errors including those of syntax and "sound alike " substitutions which may escape proof reading.  In such instances, original meaning may be extrapolated by contextual derivation.

## 2021-05-09 NOTE — ED Nurses Note (Signed)
Patient followed provider out to desk to be discharged, so provider provided her with AVS Summary and reviewed it with her.

## 2021-05-09 NOTE — Discharge Instructions (Signed)
Your workup is negative today.    You have a viral upper respiratory infection, which is mostly a common cold.    Drink plenty of water, avoid dairy products which will make drainage thicker.    Continue your current medications.    I have prescribed a cough medication to take as needed.

## 2021-05-09 NOTE — ED Nurses Note (Signed)
This RN called [employer] POA listed.  Message was given to [facility] to contact "Tricia Bailey" and have her return Tricia Bailey purse to the ED.  Patient states, she had her purse with her and Tricia Bailey took it when she left.  Her car keys, monies and personal effects are in the purse.  Cathy verbalized understanding and will contact Tricia Bailey to return the purse to the ED. Patient and FM present are aware.

## 2021-11-07 ENCOUNTER — Observation Stay
Admission: EM | Admit: 2021-11-07 | Discharge: 2021-11-08 | Disposition: A | Discharge status: Home / Self Care | Payer: Medicare PPO | Attending: Family Medicine | Admitting: Family Medicine

## 2021-11-07 ENCOUNTER — Emergency Department (HOSPITAL_COMMUNITY): Payer: Medicare PPO

## 2021-11-07 ENCOUNTER — Other Ambulatory Visit: Payer: Self-pay

## 2021-11-07 ENCOUNTER — Encounter (HOSPITAL_COMMUNITY): Payer: Self-pay

## 2021-11-07 DIAGNOSIS — R42 Dizziness and giddiness: Secondary | ICD-10-CM | POA: Insufficient documentation

## 2021-11-07 DIAGNOSIS — R197 Diarrhea, unspecified: Secondary | ICD-10-CM | POA: Insufficient documentation

## 2021-11-07 DIAGNOSIS — F0390 Unspecified dementia without behavioral disturbance: Secondary | ICD-10-CM | POA: Insufficient documentation

## 2021-11-07 DIAGNOSIS — R519 Headache, unspecified: Secondary | ICD-10-CM | POA: Insufficient documentation

## 2021-11-07 DIAGNOSIS — I10 Essential (primary) hypertension: Secondary | ICD-10-CM | POA: Insufficient documentation

## 2021-11-07 DIAGNOSIS — J449 Chronic obstructive pulmonary disease, unspecified: Secondary | ICD-10-CM | POA: Insufficient documentation

## 2021-11-07 DIAGNOSIS — Z79890 Hormone replacement therapy (postmenopausal): Secondary | ICD-10-CM | POA: Insufficient documentation

## 2021-11-07 DIAGNOSIS — Z79899 Other long term (current) drug therapy: Secondary | ICD-10-CM | POA: Insufficient documentation

## 2021-11-07 DIAGNOSIS — K219 Gastro-esophageal reflux disease without esophagitis: Secondary | ICD-10-CM | POA: Insufficient documentation

## 2021-11-07 DIAGNOSIS — R079 Chest pain, unspecified: Principal | ICD-10-CM | POA: Insufficient documentation

## 2021-11-07 LAB — GOLD TOP TUBE

## 2021-11-07 LAB — CBC WITH DIFF
BASOPHIL #: <0.1 10*3/uL (ref <=0.20)
BASOPHIL %: 0 %
EOSINOPHIL #: 0.12 10*3/uL (ref <=0.50)
EOSINOPHIL %: 2 %
HCT: 35.7 % (ref 34.8–46.0)
HGB: 10.7 g/dL — ABNORMAL LOW (ref 11.5–16.0)
IMMATURE GRANULOCYTE #: <0.1 10*3/uL (ref <0.10)
IMMATURE GRANULOCYTE %: 0 % (ref 0–1)
LYMPHOCYTE #: 1.13 10*3/uL (ref 1.00–4.80)
LYMPHOCYTE %: 21 %
MCH: 24.7 pg — ABNORMAL LOW (ref 26.0–32.0)
MCHC: 30 g/dL — ABNORMAL LOW (ref 31.0–35.5)
MCV: 82.4 fL (ref 78.0–100.0)
MONOCYTE #: 0.64 10*3/uL (ref 0.20–1.10)
MONOCYTE %: 12 %
MPV: 10.4 fL (ref 8.7–12.5)
NEUTROPHIL #: 3.57 10*3/uL (ref 1.50–7.70)
NEUTROPHIL %: 65 %
PLATELETS: 120 10*3/uL — ABNORMAL LOW (ref 150–400)
RBC: 4.33 10*6/uL (ref 3.85–5.22)
RDW-CV: 16.6 % — ABNORMAL HIGH (ref 11.5–15.5)
WBC: 5.5 10*3/uL (ref 3.7–11.0)

## 2021-11-07 LAB — LACTIC ACID LEVEL W/ REFLEX FOR LEVEL >2.0: LACTIC ACID: 1.8 mmol/L (ref 0.5–2.2)

## 2021-11-07 LAB — COMPREHENSIVE METABOLIC PANEL, NON-FASTING
ALBUMIN: 3.2 g/dL — ABNORMAL LOW (ref 3.4–4.8)
ALKALINE PHOSPHATASE: 100 U/L (ref 55–145)
ALT (SGPT): 34 U/L — ABNORMAL HIGH (ref 8–22)
ANION GAP: 9 mmol/L (ref 4–13)
AST (SGOT): 43 U/L (ref 8–45)
BILIRUBIN TOTAL: 0.5 mg/dL (ref 0.3–1.3)
BUN/CREA RATIO: 13 (ref 6–22)
BUN: 14 mg/dL (ref 8–25)
CALCIUM: 9.4 mg/dL (ref 8.8–10.2)
CHLORIDE: 107 mmol/L (ref 96–111)
CO2 TOTAL: 23 mmol/L (ref 23–31)
CREATININE: 1.06 mg/dL — ABNORMAL HIGH (ref 0.60–1.05)
ESTIMATED GFR: 48 mL/min/BSA — ABNORMAL LOW (ref >=60)
GLUCOSE: 101 mg/dL (ref 65–125)
POTASSIUM: 4.2 mmol/L (ref 3.5–5.1)
PROTEIN TOTAL: 6.8 g/dL (ref 6.0–8.0)
SODIUM: 139 mmol/L (ref 136–145)

## 2021-11-07 LAB — ECG 12-LEAD
Atrial Rate: 84 {beats}/min
Calculated P Axis: 33 degrees
Calculated R Axis: -12 degrees
Calculated T Axis: 80 degrees
PR Interval: 198 ms
QRS Duration: 72 ms
QT Interval: 372 ms
QTC Calculation: 439 ms
Ventricular rate: 84 {beats}/min

## 2021-11-07 LAB — URINALYSIS, MACROSCOPIC
BILIRUBIN: NEGATIVE mg/dL
BLOOD: NEGATIVE mg/dL
GLUCOSE: NEGATIVE mg/dL
KETONES: NEGATIVE mg/dL
LEUKOCYTES: NEGATIVE WBCs/uL
NITRITE: NEGATIVE
PH: 7 (ref 5.0–8.5)
PROTEIN: NEGATIVE mg/dL
SPECIFIC GRAVITY: 1.01 (ref 1.005–1.030)
UROBILINOGEN: 0.2 mg/dL

## 2021-11-07 LAB — TROPONIN-I: TROPONIN-I HS: 14.1 ng/L (ref <=14.0)

## 2021-11-07 LAB — URINALYSIS, MICROSCOPIC

## 2021-11-07 LAB — BLUE TOP TUBE

## 2021-11-07 LAB — CREATINE KINASE (CK), TOTAL, SERUM: CREATINE KINASE: 103 U/L (ref 25–190)

## 2021-11-07 MED ORDER — CYCLOBENZAPRINE 10 MG TABLET
10.0000 mg | ORAL_TABLET | Freq: Three times a day (TID) | ORAL | Status: DC
Start: 2021-11-07 — End: 2021-11-08
  Administered 2021-11-07 – 2021-11-08 (×2): 10 mg via ORAL
  Filled 2021-11-07 (×2): qty 1

## 2021-11-07 MED ORDER — ALUMINUM-MAG HYDROXIDE-SIMETHICONE 200 MG-200 MG-20 MG/5 ML ORAL SUSP
10.0000 mL | ORAL | Status: DC | PRN
Start: 2021-11-07 — End: 2021-11-08

## 2021-11-07 MED ORDER — MAGNESIUM HYDROXIDE 400 MG/5 ML ORAL SUSPENSION
15.0000 mL | Freq: Every day | ORAL | Status: DC | PRN
Start: 2021-11-07 — End: 2021-11-08

## 2021-11-07 MED ORDER — ACETAMINOPHEN 325 MG TABLET
650.0000 mg | ORAL_TABLET | ORAL | Status: DC | PRN
Start: 2021-11-07 — End: 2021-11-08

## 2021-11-07 MED ORDER — PANTOPRAZOLE 40 MG TABLET,DELAYED RELEASE
40.0000 mg | DELAYED_RELEASE_TABLET | Freq: Every evening | ORAL | Status: DC
Start: 2021-11-07 — End: 2021-11-08
  Administered 2021-11-07: 40 mg via ORAL
  Filled 2021-11-07: qty 1

## 2021-11-07 MED ORDER — ALBUTEROL SULFATE HFA 90 MCG/ACTUATION AEROSOL INHALER
2.0000 | INHALATION_SPRAY | Freq: Four times a day (QID) | RESPIRATORY_TRACT | Status: DC | PRN
Start: 2021-11-07 — End: 2021-11-08

## 2021-11-07 MED ORDER — SODIUM CHLORIDE 0.9 % INTRAVENOUS SOLUTION
INTRAVENOUS | Status: DC
Start: 2021-11-07 — End: 2021-11-08

## 2021-11-07 MED ORDER — PROMETHAZINE 25 MG TABLET
25.0000 mg | ORAL_TABLET | Freq: Four times a day (QID) | ORAL | Status: DC | PRN
Start: 2021-11-07 — End: 2021-11-08

## 2021-11-07 MED ORDER — ENOXAPARIN 30 MG/0.3 ML SUBCUTANEOUS SYRINGE
30.0000 mg | INJECTION | SUBCUTANEOUS | Status: DC
Start: 2021-11-07 — End: 2021-11-08
  Administered 2021-11-07: 30 mg via SUBCUTANEOUS
  Filled 2021-11-07: qty 0.3

## 2021-11-07 MED ORDER — SENNOSIDES 8.6 MG-DOCUSATE SODIUM 50 MG TABLET
1.0000 | ORAL_TABLET | Freq: Two times a day (BID) | ORAL | Status: DC | PRN
Start: 2021-11-07 — End: 2021-11-08

## 2021-11-07 MED ORDER — LEVOTHYROXINE 25 MCG TABLET
25.0000 ug | ORAL_TABLET | Freq: Every morning | ORAL | Status: DC
Start: 2021-11-08 — End: 2021-11-08
  Administered 2021-11-08: 25 ug via ORAL
  Filled 2021-11-07: qty 1

## 2021-11-07 MED ORDER — AMLODIPINE 5 MG TABLET
2.5000 mg | ORAL_TABLET | Freq: Two times a day (BID) | ORAL | Status: DC
Start: 2021-11-07 — End: 2021-11-08
  Administered 2021-11-07 – 2021-11-08 (×2): 2.5 mg via ORAL
  Filled 2021-11-07 (×2): qty 1

## 2021-11-07 MED ORDER — SODIUM CHLORIDE 0.9 % (FLUSH) INJECTION SYRINGE
10.0000 mL | INJECTION | INTRAMUSCULAR | Status: DC | PRN
Start: 2021-11-07 — End: 2021-11-08

## 2021-11-07 MED ORDER — METOPROLOL TARTRATE 50 MG TABLET
50.0000 mg | ORAL_TABLET | Freq: Two times a day (BID) | ORAL | Status: DC
Start: 2021-11-07 — End: 2021-11-08
  Administered 2021-11-07 – 2021-11-08 (×2): 50 mg via ORAL
  Filled 2021-11-07 (×2): qty 1

## 2021-11-07 MED ORDER — SODIUM CHLORIDE 0.9 % (FLUSH) INJECTION SYRINGE
10.0000 mL | INJECTION | Freq: Three times a day (TID) | INTRAMUSCULAR | Status: DC
Start: 2021-11-07 — End: 2021-11-08
  Administered 2021-11-07 – 2021-11-08 (×2): 0 mL

## 2021-11-07 MED ORDER — ASPIRIN 81 MG CHEWABLE TABLET
324.0000 mg | CHEWABLE_TABLET | ORAL | Status: AC
Start: 2021-11-07 — End: 2021-11-07
  Administered 2021-11-07: 324 mg via ORAL
  Filled 2021-11-07: qty 4

## 2021-11-07 MED ORDER — POLYETHYLENE GLYCOL 3350 17 GRAM ORAL POWDER PACKET
17.0000 g | Freq: Every day | ORAL | Status: DC | PRN
Start: 2021-11-07 — End: 2021-11-08

## 2021-11-07 MED ORDER — HYDROCODONE 5 MG-ACETAMINOPHEN 325 MG TABLET
1.0000 | ORAL_TABLET | Freq: Four times a day (QID) | ORAL | Status: DC | PRN
Start: 2021-11-07 — End: 2021-11-08
  Administered 2021-11-07 – 2021-11-08 (×2): 1 via ORAL
  Filled 2021-11-07 (×2): qty 1

## 2021-11-07 MED ORDER — ONDANSETRON HCL (PF) 4 MG/2 ML INJECTION SOLUTION
4.0000 mg | Freq: Four times a day (QID) | INTRAMUSCULAR | Status: DC | PRN
Start: 2021-11-07 — End: 2021-11-08

## 2021-11-07 NOTE — ED Provider Notes (Signed)
[facility]  Emergency Department  Provider Note      Tricia Bailey  03-31-1925  88 y.o.  female  [place] [address]   [phone] (home)  Naomie Dean, DO    Chief Complaint:   Chief Complaint   Patient presents with   . Headache   . Dizziness       Arrival:  POV    HPI: This is a 88 y.o. female with PMH of HYPERTENSION, DEMENTIA, GERD, CHRONIC OBSTRUCTIVE PULMONARY DISORDER who presents to the emergency department WITH COMPLAINTS OF DIZZINESS X2 MONTHS.  SHE HAS BEEN PRESCRIBED A Z-PAK X2 AND MECLIZINE.  PATIENT STATES THAT A MECLIZINE CAUSED VISUAL CHANGES SO SHE COULD NOT TOLERATE IT.  TODAY A COUPLE HOURS PRIOR TO ARRIVAL, PATIENT DID DEVELOP SOME CHEST DISCOMFORT AND FELT LIKE HER HEART WAS RACING.  THE SYMPTOMS HAVE RESOLVED PRIOR TO ED ARRIVAL.    Patient has had no known COVID exposures.    Past Medical History:   Past Medical History:   Diagnosis Date   . Arthropathy    . COPD (chronic obstructive pulmonary disease) (CMS HCC)    . GERD (gastroesophageal reflux disease)    . HTN (hypertension)        Past Surgical History: History reviewed. No pertinent surgical history.    Social History:   Social History     Tobacco Use   . Smoking status: Never   . Smokeless tobacco: Never   Vaping Use   . Vaping Use: Never used   Substance Use Topics   . Alcohol use: Never   . Drug use: Never      Social History     Substance and Sexual Activity   Drug Use Never       Allergies:   Allergies   Allergen Reactions   . Cephalexin  Other Adverse Reaction (Add comment)     unknown   . Hctz [Hydrochlorothiazide]  Other Adverse Reaction (Add comment)     unknown   . Macrodantin [Nitrofurantoin]  Other Adverse Reaction (Add comment)     unknown   . Minocin [Minocycline]  Other Adverse Reaction (Add comment)     unknown         Medications:   Prior to Admission medications    Medication Sig Start Date End Date Taking? Authorizing Provider   albuterol sulfate (PROVENTIL OR VENTOLIN OR PROAIR) 90  mcg/actuation Inhalation HFA Aerosol Inhaler Take 1-2 Puffs by inhalation Every 6 hours as needed    Provider, Historical   amLODIPine (NORVASC) 2.5 mg Oral Tablet Take 2.5 mg by mouth Twice daily    Provider, Historical   cyclobenzaprine (FLEXERIL) 10 mg Oral Tablet Take 10 mg by mouth Three times a day    Provider, Historical   HYDROcodone-acetaminophen (NORCO) 5-325 mg Oral Tablet Take 1 Tablet by mouth Every 6 hours as needed for Pain 11/04/19   Avel Sensor, DO   levothyroxine (SYNTHROID) 25 mcg Oral Tablet Take 25 mcg by mouth Every morning Unsure of dose    Provider, Historical   nebivoloL (BYSTOLIC) 10 mg Oral Tablet Take 10 mg by mouth Twice daily    Provider, Historical   omeprazole (PRILOSEC) 20 mg Oral Capsule, Delayed Release(E.C.) Take 20 mg by mouth Once a day    Provider, Historical   promethazine (PHENERGAN) 25 mg Oral Tablet Take 25 mg by mouth Every 6 hours as needed for Nausea/Vomiting    Provider, Historical   promethazine-dextromethorphan (PHENERGAN-DM) 6.25-15  mg/5 mL Oral Syrup Take 5 mL by mouth Four times a day as needed for Cough 05/09/21 11/07/21  Marylee Floras, PA-C       Above history, allergies and medication list reviewed with patient.          Physical Exam  RN note reviewed. Vitals reviewed.   Body mass index is 24.33 kg/m.  ED Triage Vitals [11/07/21 1716]   BP (Non-Invasive) (!) 183/93   Heart Rate 86   Respiratory Rate 19   Temperature 37.6 C (99.6 F)   SpO2 94 %   Weight 72.6 kg (160 lb)   Height 1.727 m ( )     Constitutional: patient is alert. Well-developed, well-nourished, and in no acute distress. Nontoxic on appearance.   HENT:   Mask in place.  Head: Normocephalic and atraumatic.   Right Ear: External ear normal.   Left Ear: External ear normal.   Nose: Nose normal.   Mouth/Throat: Mucosal membranes are moist. Oropharynx is clear. Uvula is midline. Airway is patent.  Eyes: Pupils are equal, round, and reactive to light. Conjunctivae clear and EOM are normal.   Neck:  Normal range of motion. Neck supple.  Trachea midline.  Cardiovascular: Normal rate, regular rhythm, POSITIVE MURMUR and intact distal pulses.   Pulmonary/Chest:  Breath sounds normal.  Effort normal. No chest wall deformity.  Abdominal: Non-tender. Soft. No ascites. Bowel sounds are normal.  No rebound tenderness or guarding.  Musculoskeletal: Normal range of motion for patient's baseline. No acute deformities.  Neurological: Patient is alert and oriented to person, place, and SITUATION CN II-XII are intact. GCS score is 15. Sensory is at patient's baseline. Gait is at patient's baseline.  Skin: Skin is warm and dry. No rashes  Psychiatric: Mood, affect and judgment normal.  Memory is at patient's baseline.      ED Course/ MDM/ Plan:   Patient was triaged, vital signs were obtained, patient was  placed in a room.  On exam, patient is alert and oriented, nontoxic on exam, and in no acute distress. Vitals were reviewed. The following orders were placed after examining the patient and the following results were reviewed by me.    Orders Placed This Encounter   . XR AP MOBILE CHEST   . CT BRAIN WO IV CONTRAST   . COMPREHENSIVE METABOLIC PANEL, NON-FASTING   . CBC/DIFF   . URINALYSIS, MACROSCOPIC AND MICROSCOPIC   . LACTIC ACID LEVEL W/ REFLEX FOR LEVEL >2.0   . TROPONIN-I   . CREATINE KINASE (CK), TOTAL, SERUM   . CBC WITH DIFF   . URINALYSIS, MACROSCOPIC   . URINALYSIS, MICROSCOPIC   . EXTRA TUBES   . BLUE TOP TUBE   . GOLD TOP TUBE   . ECG 12-LEAD   . INSERT & MAINTAIN PERIPHERAL IV ACCESS   . aspirin chewable tablet 324 mg     CT BRAIN WO IV CONTRAST   Final Result   No evidence of acute infarct or intracranial hemorrhage.               The CT exam was performed using one or more of the following dose reduction techniques: Automated exposure control, adjustment of the mA and/or kV according to the patient's size, or use of iterative reconstruction technique.               Radiologist location ID: [id number]          XR AP MOBILE CHEST   Final Result  No acute cardiopulmonary process.         JLB               Radiologist location ID: [id number]           Labs Ordered/Reviewed   COMPREHENSIVE METABOLIC PANEL, NON-FASTING - Abnormal; Notable for the following components:       Result Value    CREATININE 1.06 (*)     ESTIMATED GFR 48 (*)     ALBUMIN 3.2 (*)     ALT (SGPT) 34 (*)     All other components within normal limits   TROPONIN-I - Abnormal; Notable for the following components:    TROPONIN-I HS 14.1 (*)     All other components within normal limits   CBC WITH DIFF - Abnormal; Notable for the following components:    HGB 10.7 (*)     MCH 24.7 (*)     MCHC 30.0 (*)     RDW-CV 16.6 (*)     PLATELETS 120 (*)     All other components within normal limits   URINALYSIS, MICROSCOPIC - Abnormal; Notable for the following components:    BACTERIA Occasional (*)     All other components within normal limits   LACTIC ACID LEVEL W/ REFLEX FOR LEVEL >2.0 - Normal   CREATINE KINASE (CK), TOTAL, SERUM - Normal   URINALYSIS, MACROSCOPIC - Normal   CBC/DIFF    Narrative:     The following orders were created for panel order CBC/DIFF.  Procedure                               Abnormality         Status                     ---------                               -----------         ------                     CBC WITH DIFF[[id number]]                Abnormal            Final result                 Please view results for these tests on the individual orders.   URINALYSIS, MACROSCOPIC AND MICROSCOPIC    Narrative:     The following orders were created for panel order URINALYSIS, MACROSCOPIC AND MICROSCOPIC.  Procedure                               Abnormality         Status                     ---------                               -----------         ------                     URINALYSIS, MACROSCOPIC[[id number]]      Normal  Final result               URINALYSIS, MICROSCOPIC[[id number]]      Abnormal            Final result                  Please view results for these tests on the individual orders.   EXTRA TUBES    Narrative:     The following orders were created for panel order EXTRA TUBES.  Procedure                               Abnormality         Status                     ---------                               -----------         ------                     BLUE TOP TUBE[[id number]]                                    In process                 GOLD TOP TUBE[[id number]]                                    In process                   Please view results for these tests on the individual orders.   BLUE TOP TUBE   GOLD TOP TUBE     Results for orders placed or performed during the hospital encounter of 11/07/21 (from the past 12 hour(s))   URINALYSIS, MACROSCOPIC   Result Value Ref Range    COLOR Yellow Yellow, Straw    APPEARANCE Clear Clear, Slightly Hazy    SPECIFIC GRAVITY 1.010 1.005 - 1.030    PH 7.0 5.0 - 8.5    LEUKOCYTES Negative Negative WBCs/uL    NITRITE Negative Negative    PROTEIN Negative Negative mg/dL    GLUCOSE Negative Negative mg/dL    KETONES Negative Negative mg/dL    UROBILINOGEN 0.2 0.2 mg/dL    BILIRUBIN Negative Negative mg/dL    BLOOD Negative Negative mg/dL   URINALYSIS, MICROSCOPIC   Result Value Ref Range    RBCS 3-5 None, Occasional, 0-2, 3-5 /hpf    WBCS 3-5 None, Occasional, 0-2, 3-5 /hpf    BACTERIA Occasional (A) None /hpf    SQUAMOUS EPITHELIAL 3-5 None, Occasional, 0-2, 3-5 /hpf   COMPREHENSIVE METABOLIC PANEL, NON-FASTING   Result Value Ref Range    SODIUM 139 136 - 145 mmol/L    POTASSIUM 4.2 3.5 - 5.1 mmol/L    CHLORIDE 107 96 - 111 mmol/L    CO2 TOTAL 23 23 - 31 mmol/L    ANION GAP 9 4 - 13 mmol/L    BUN 14 8 - 25 mg/dL    CREATININE 1.88 (H) 0.60 - 1.05 mg/dL    BUN/CREA RATIO 13 6 - 22  ESTIMATED GFR 48 (L) >=60 mL/min/BSA    ALBUMIN 3.2 (L) 3.4 - 4.8 g/dL     CALCIUM 9.4 8.8 - 16.110.2 mg/dL    GLUCOSE 096101 65 - 045125 mg/dL    ALKALINE PHOSPHATASE 100 55 - 145 U/L    ALT (SGPT) 34 (H) 8 - 22 U/L    AST  (SGOT)  43 8 - 45 U/L    BILIRUBIN TOTAL 0.5 0.3 - 1.3 mg/dL    PROTEIN TOTAL 6.8 6.0 - 8.0 g/dL   LACTIC ACID LEVEL W/ REFLEX FOR LEVEL >2.0   Result Value Ref Range    LACTIC ACID 1.8 0.5 - 2.2 mmol/L   TROPONIN-I   Result Value Ref Range    TROPONIN-I HS 14.1 (HH) <=14.0 ng/L ng/L   CREATINE KINASE (CK), TOTAL, SERUM   Result Value Ref Range    CREATINE KINASE 103 25 - 190 U/L   CBC WITH DIFF   Result Value Ref Range    WBC 5.5 3.7 - 11.0 x10^3/uL    RBC 4.33 3.85 - 5.22 x10^6/uL    HGB 10.7 (L) 11.5 - 16.0 g/dL    HCT 40.935.7 81.134.8 - 91.446.0 %    MCV 82.4 78.0 - 100.0 fL    MCH 24.7 (L) 26.0 - 32.0 pg    MCHC 30.0 (L) 31.0 - 35.5 g/dL    RDW-CV 78.216.6 (H) 95.611.5 - 15.5 %    PLATELETS 120 (L) 150 - 400 x10^3/uL    MPV 10.4 8.7 - 12.5 fL    NEUTROPHIL % 65 %    LYMPHOCYTE % 21 %    MONOCYTE % 12 %    EOSINOPHIL % 2 %    BASOPHIL % 0 %    NEUTROPHIL # 3.57 1.50 - 7.70 x10^3/uL    LYMPHOCYTE # 1.13 1.00 - 4.80 x10^3/uL    MONOCYTE # 0.64 0.20 - 1.10 x10^3/uL    EOSINOPHIL # 0.12 <=0.50 x10^3/uL    BASOPHIL # <0.10 <=0.20 x10^3/uL    IMMATURE GRANULOCYTE % 0 0 - 1 %    IMMATURE GRANULOCYTE # <0.10 <0.10 x10^3/uL     CT BRAIN WO IV CONTRAST   Final Result by Edi, Radresults In (05/30 1825)   No evidence of acute infarct or intracranial hemorrhage.               The CT exam was performed using one or more of the following dose reduction techniques: Automated exposure control, adjustment of the mA and/or kV according to the patient's size, or use of iterative reconstruction technique.               Radiologist location ID: [id number]         XR AP MOBILE CHEST   Final Result by Edi, Radresults In (05/30 1821)   No acute cardiopulmonary process.         JLB               Radiologist location ID: [id number]               The patient received the following medications while in the ED (also noted above):  Medications Administered in the ED   aspirin chewable tablet 324 mg (has no administration in time range)               Procedures  None    Critical Care:    Not applicable.    Medical Decision Making  Given the patient's HPI, PMH, current  symptoms and exam, concern for, but not limited to:   ACS, DYSRHYTHMIA, CEREBROVASCULAR ACCIDENT OR TRANSIENT ISCHEMIC ATTACK, VERTIGO, MENIERE'S, OTITIS MEDIA URINARY TRACT INFECTION    Amount and/or Complexity of Data Reviewed  Labs: ordered. Decision-making details documented in ED Course.  Radiology: ordered. Decision-making details documented in ED Course.  ECG/medicine tests: ordered. Decision-making details documented in ED Course.  Discussion of management or test interpretation with external provider(s): WORK-UP WAS COMPLETED.  EKG DID NOT SHOW ANY ACUTE ST CHANGES HOWEVER HER TROPONIN WAS ELEVATED AT 14.1.  HAVE NO PRIOR TROPONINS TO COMPARE THIS.  SHE IS REMAINED CHEST PAIN-FREE IN THE ER BUT WAS FELT WOULD BENEFIT FROM FURTHER CARDIAC EVALUATION.  SHE IS REMAINED HEMODYNAMICALLY STABLE THROUGHOUT THE ED COURSE AND AGREES BE ADMITTED.  I DISCUSSED PATIENT'S CASE WITH THE HOSPITALIST, DR. [facility] WHO WILL ADMIT THE PATIENT.    Risk  OTC drugs.  Decision regarding hospitalization.              Clinical Impression:   Clinical Impression   Chest pain, unspecified type (Primary)   Dizziness       Patient admitted for further evaluation    Disposition: Admitted  New Prescriptions    No medications on file      No follow-up provider specified.   No future appointments.  BP (!) 183/93   Pulse 86   Temp 37.6 C (99.6 F)   Resp 19   Ht 1.727 m (5\' 8" )   Wt 72.6 kg (160 lb)   SpO2 94%   BMI 24.33 kg/m          , PA-C 11/07/2021       This note was partially created using voice recognition software and is inherently subject to errors including those of syntax and "sound alike " substitutions which may escape proof reading.  In such instances, original meaning may be extrapolated by contextual derivation.    I did not personally see or evaluate this patient but was  immediately available for consultation as deemed necessary by the midlevel provider.    11/09/2021, DO

## 2021-11-07 NOTE — ED Nurses Note (Signed)
To and from CT without incident

## 2021-11-07 NOTE — ED Nurses Note (Signed)
Report given to [facility] at [facility] utilizing the in house nursing report sheet

## 2021-11-07 NOTE — ED Triage Notes (Addendum)
Patient arrives to ED accompanied by niece Olegario Messier, who is MPOA; DPOA    CC: Dizziness 2 months, headache x 2 weeks.  PTA: 2 Imodium for diarrhea.    Pt is HOH    Hx: dementia; sundowners;

## 2021-11-07 NOTE — ED Nurses Note (Signed)
Patient assisted to bedside commode with RN and patient's daughter. Patient placed back in bed, placed on cardiac monitor.

## 2021-11-07 NOTE — ED Nurses Note (Signed)
Attempted to call report to [facility] stated they will call back when available

## 2021-11-07 NOTE — Care Plan (Signed)
A/O x4. Up to BR with 1 assist, gait unsteady. SR 80's on tele. O2 sat 94% on RA, displays tachypnea with exertion. No reports of chest pain this shift. IVF infusing per order. Bed alarm maintained for safety. Call light in reach.      Luther Hearing, RN 11/08/2021, 04:30

## 2021-11-08 ENCOUNTER — Observation Stay (HOSPITAL_COMMUNITY): Payer: Medicare PPO

## 2021-11-08 DIAGNOSIS — K219 Gastro-esophageal reflux disease without esophagitis: Secondary | ICD-10-CM | POA: Diagnosis present

## 2021-11-08 DIAGNOSIS — I10 Essential (primary) hypertension: Secondary | ICD-10-CM | POA: Diagnosis present

## 2021-11-08 DIAGNOSIS — R42 Dizziness and giddiness: Secondary | ICD-10-CM | POA: Diagnosis present

## 2021-11-08 LAB — COMPREHENSIVE METABOLIC PANEL, NON-FASTING
ALBUMIN: 3 g/dL — ABNORMAL LOW (ref 3.4–4.8)
ALKALINE PHOSPHATASE: 95 U/L (ref 55–145)
ALT (SGPT): 29 U/L — ABNORMAL HIGH (ref 8–22)
ANION GAP: 9 mmol/L (ref 4–13)
AST (SGOT): 34 U/L (ref 8–45)
BILIRUBIN TOTAL: 0.7 mg/dL (ref 0.3–1.3)
BUN/CREA RATIO: 10 (ref 6–22)
BUN: 12 mg/dL (ref 8–25)
CALCIUM: 9.1 mg/dL (ref 8.8–10.2)
CHLORIDE: 107 mmol/L (ref 96–111)
CO2 TOTAL: 25 mmol/L (ref 23–31)
CREATININE: 1.15 mg/dL — ABNORMAL HIGH (ref 0.60–1.05)
ESTIMATED GFR: 44 mL/min/BSA — ABNORMAL LOW (ref >=60)
GLUCOSE: 153 mg/dL — ABNORMAL HIGH (ref 65–125)
POTASSIUM: 3.8 mmol/L (ref 3.5–5.1)
PROTEIN TOTAL: 6.5 g/dL (ref 6.0–8.0)
SODIUM: 141 mmol/L (ref 136–145)

## 2021-11-08 LAB — CBC WITH DIFF
BASOPHIL #: <0.1 10*3/uL (ref <=0.20)
BASOPHIL %: 1 %
EOSINOPHIL #: 0.13 10*3/uL (ref <=0.50)
EOSINOPHIL %: 3 %
HCT: 34.9 % (ref 34.8–46.0)
HGB: 10.6 g/dL — ABNORMAL LOW (ref 11.5–16.0)
IMMATURE GRANULOCYTE #: <0.1 10*3/uL (ref <0.10)
IMMATURE GRANULOCYTE %: 0 % (ref 0–1)
LYMPHOCYTE #: 1.23 10*3/uL (ref 1.00–4.80)
LYMPHOCYTE %: 29 %
MCH: 24.9 pg — ABNORMAL LOW (ref 26.0–32.0)
MCHC: 30.4 g/dL — ABNORMAL LOW (ref 31.0–35.5)
MCV: 81.9 fL (ref 78.0–100.0)
MONOCYTE #: 0.39 10*3/uL (ref 0.20–1.10)
MONOCYTE %: 9 %
MPV: 10.8 fL (ref 8.7–12.5)
NEUTROPHIL #: 2.51 10*3/uL (ref 1.50–7.70)
NEUTROPHIL %: 58 %
PLATELETS: 118 10*3/uL — ABNORMAL LOW (ref 150–400)
RBC: 4.26 10*6/uL (ref 3.85–5.22)
RDW-CV: 16.7 % — ABNORMAL HIGH (ref 11.5–15.5)
WBC: 4.3 10*3/uL (ref 3.7–11.0)

## 2021-11-08 LAB — TROPONIN-I
TROPONIN-I HS: 14.1 ng/L (ref <=14.0)
TROPONIN-I HS: 16.5 ng/L (ref <=14.0)

## 2021-11-08 MED ORDER — METHYLPREDNISOLONE 4 MG TABLETS IN A DOSE PACK
ORAL_TABLET | ORAL | 0 refills | Status: DC
Start: 2021-11-08 — End: 2022-03-29

## 2021-11-08 MED ORDER — CLONAZEPAM 0.125 MG DISINTEGRATING TABLET
0.1250 mg | ORAL_TABLET | Freq: Four times a day (QID) | ORAL | 0 refills | Status: DC | PRN
Start: 2021-11-08 — End: 2022-03-31

## 2021-11-08 NOTE — Care Management Notes (Signed)
11/08/21 1240   Assessment Details   Assessment Type Admission   Date of Care Management Update 11/08/21   Readmission   Is this a readmission? No   Insurance Information/Type   Insurance type Medicare   Employment/Financial   Patient has Prescription Coverage?  Yes        Name of Insurance Coverage for Medications [employer]   Financial Concerns none   Living Environment   Select an age group to open "lives with" row.  Adult   Lives With child(ren), adult  (Lives with daughter, Tricia Bailey)   Living Arrangements house   Able to Return to Prior Arrangements yes   Home Safety   Home Accessibility no concerns   Custody and Legal Status   Do you have a court appointed guardian/conservator? No   Are you an emancipated minor? No   Custody Issues? No   Paternity Affidavit Requested? No   Care Management Plan   Discharge Planning Status initial meeting   Projected Discharge Date 11/08/21   Discharge plan discussed with: Patient;MPOA   CM will evaluate for rehabilitation potential no   Patient choice offered to patient/family no   Form for patient choice reviewed/signed and on chart no   Patient aware of possible cost for ambulance transport?  No   Discharge Needs Assessment   Equipment Currently Used at Home cane, straight;walker, rolling   Equipment Needed After Discharge none   Community Agency Name(s) Patient's daughter/MPOA Tricia Bailey inquired about in home housekeeping/chores - information/phone number for [employer] provided   Discharge Facility/Level of Care Needs Home (Patient/Family Member/other)(code 1)   Transportation Available family or friend will provide   Referral Information   Admission Type observation   Arrived From home or self-care   ADVANCE DIRECTIVES   Does the Patient have an Advance Directive? Yes, Patient Does Have Advance Directive for Healthcare Treatment   Document the Substance of the Advance Directive (Required) MPOA - Tricia Bailey - Daughter   Type of Advance Directive  Completed Medical Power of Attorney   Copy of Advance Directives in Chart? 2   Name of MPOA or Healthcare Surrogate Tricia Bailey   Phone Number of MPOA or Healthcare Surrogate [phone]

## 2021-11-08 NOTE — H&P (Signed)
[facility]  Hospitalist  History & Physical    Date of Service:  11/08/2021  Tricia Bailey, 88 y.o. female  Date of Admission:  11/07/2021  Date of Birth:  09-30-1924  PCP: Tricia Dean, DO    Chief Complaint:  Dizziness headache and chest pressure.    HPI:  Tricia Bailey is a 88 y.o. White female who is admitted for chest pain rule out.  Patient presented to the emergency department with complaints of dizziness and headache.  Symptoms have been present approximally 2 weeks.  Family was concerned she was going to fall.  Every time she would move or lean forward she became dizzy.  Patient is brought to the emergency room for evaluation.  In the emergency room patient laboratory studies and diagnostic imaging.  Chest x-ray showed no acute process.  CT of the brain shows no acute infarct or intracranial hemorrhage.  Chest x-ray shows no pneumonia pneumothorax or pleural effusion.  Laboratory studies show troponin of 14.1 with normal renal function electrolytes.  Urinalysis was unremarkable.  White count was 5.5.  Patient was placed under hospitalist service for continue medical management.    Past Medical History:   Diagnosis Date    Arthropathy     COPD (chronic obstructive pulmonary disease) (CMS HCC)     GERD (gastroesophageal reflux disease)     HTN (hypertension)       History reviewed. No pertinent surgical history.   Social History     Tobacco Use    Smoking status: Never    Smokeless tobacco: Never   Vaping Use    Vaping Use: Never used   Substance Use Topics    Alcohol use: Never    Drug use: Never       Family Medical History:    None        Medications Prior to Admission       Prescriptions    albuterol sulfate (PROVENTIL OR VENTOLIN OR PROAIR) 90 mcg/actuation Inhalation HFA Aerosol Inhaler    Take 1-2 Puffs by inhalation Every 6 hours as needed    amLODIPine (NORVASC) 2.5 mg Oral Tablet    Take 2.5 mg by mouth Twice daily    cyclobenzaprine (FLEXERIL) 10 mg Oral Tablet     Take 10 mg by mouth Three times a day    HYDROcodone-acetaminophen (NORCO) 5-325 mg Oral Tablet    Take 1 Tablet by mouth Every 6 hours as needed for Pain    levothyroxine (SYNTHROID) 25 mcg Oral Tablet    Take 25 mcg by mouth Every morning Unsure of dose    nebivoloL (BYSTOLIC) 10 mg Oral Tablet    Take 10 mg by mouth Twice daily    omeprazole (PRILOSEC) 20 mg Oral Capsule, Delayed Release(E.C.)    Take 20 mg by mouth Once a day    promethazine (PHENERGAN) 25 mg Oral Tablet    Take 25 mg by mouth Every 6 hours as needed for Nausea/Vomiting           Allergies   Allergen Reactions    Cephalexin  Other Adverse Reaction (Add comment)     unknown    Hctz [Hydrochlorothiazide]  Other Adverse Reaction (Add comment)     unknown    Macrodantin [Nitrofurantoin]  Other Adverse Reaction (Add comment)     unknown    Minocin [Minocycline]  Other Adverse Reaction (Add comment)     unknown  Advanced Directives:        Review of Systems   Constitutional: Positive for malaise/fatigue. Negative for chills, diaphoresis and fever.   HENT: Negative for congestion, ear pain and sore throat.    Eyes: Negative.    Respiratory: Negative for cough, hemoptysis, sputum production, shortness of breath and wheezing.    Cardiovascular: Positive for chest pain. Negative for palpitations, orthopnea and leg swelling.   Gastrointestinal: Negative for abdominal pain, constipation, diarrhea, heartburn, nausea and vomiting.   Genitourinary: Negative for dysuria.   Musculoskeletal: Negative.    Skin: Negative for rash.   Neurological: Positive for dizziness and headaches.   Psychiatric/Behavioral: The patient does not have insomnia.           Filed Vitals:    11/08/21 0330 11/08/21 0609 11/08/21 0728 11/08/21 0824   BP: (!) 167/84  (!) 140/72    Pulse: 69  59    Resp: (!) 24 20 20     Temp: 37.4 C (99.3 F)  36.8 C (98.3 F)    SpO2:    96%       Physical Exam  Vitals and nursing note reviewed.   Constitutional:       General: She is not in  acute distress.     Appearance: She is not diaphoretic.   HENT:      Head: Normocephalic and atraumatic.      Right Ear: External ear normal.      Left Ear: External ear normal.      Nose: Nose normal.   Eyes:      General: No scleral icterus.     Pupils: Pupils are equal, round, and reactive to light.   Cardiovascular:      Rate and Rhythm: Normal rate and regular rhythm.      Heart sounds: Normal heart sounds. No murmur heard.    No friction rub. No gallop.   Pulmonary:      Effort: Pulmonary effort is normal. No respiratory distress.      Breath sounds: Normal breath sounds. No wheezing or rales.   Chest:      Chest wall: No tenderness.   Abdominal:      General: Bowel sounds are normal. There is no distension.      Palpations: Abdomen is soft. There is no mass.      Tenderness: There is no abdominal tenderness. There is no guarding or rebound.   Musculoskeletal:         General: Normal range of motion.      Cervical back: Normal range of motion and neck supple.   Skin:     General: Skin is warm and dry.      Findings: No erythema.   Neurological:      Mental Status: She is alert and oriented to person, place, and time.      Cranial Nerves: No cranial nerve deficit.   Psychiatric:         Mood and Affect: Mood and affect normal.          Laboratory Data:     Admission on 11/07/2021   Component Date Value    SODIUM 11/07/2021 139     POTASSIUM 11/07/2021 4.2     CHLORIDE 11/07/2021 107     CO2 TOTAL 11/07/2021 23     ANION GAP 11/07/2021 9     BUN 11/07/2021 14     CREATININE 11/07/2021 1.06 (H)     BUN/CREA RATIO 11/07/2021 13  ESTIMATED GFR 11/07/2021 48 (L)     ALBUMIN 11/07/2021 3.2 (L)     CALCIUM 11/07/2021 9.4     GLUCOSE 11/07/2021 101     ALKALINE PHOSPHATASE 11/07/2021 100     ALT (SGPT) 11/07/2021 34 (H)     AST (SGOT)  11/07/2021 43     BILIRUBIN TOTAL 11/07/2021 0.5     PROTEIN TOTAL 11/07/2021 6.8     LACTIC ACID 11/07/2021 1.8     TROPONIN-I HS 11/07/2021 14.1 (HH)     CREATINE KINASE 11/07/2021  103     Ventricular rate 11/07/2021 84     Atrial Rate 11/07/2021 84     PR Interval 11/07/2021 198     QRS Duration 11/07/2021 72     QT Interval 11/07/2021 372     QTC Calculation 11/07/2021 439     Calculated P Axis 11/07/2021 33     Calculated R Axis 11/07/2021 -12     Calculated T Axis 11/07/2021 80     WBC 11/07/2021 5.5     RBC 11/07/2021 4.33     HGB 11/07/2021 10.7 (L)     HCT 11/07/2021 35.7     MCV 11/07/2021 82.4     MCH 11/07/2021 24.7 (L)     MCHC 11/07/2021 30.0 (L)     RDW-CV 11/07/2021 16.6 (H)     PLATELETS 11/07/2021 120 (L)     MPV 11/07/2021 10.4     NEUTROPHIL % 11/07/2021 65     LYMPHOCYTE % 11/07/2021 21     MONOCYTE % 11/07/2021 12     EOSINOPHIL % 11/07/2021 2     BASOPHIL % 11/07/2021 0     NEUTROPHIL # 11/07/2021 3.57     LYMPHOCYTE # 11/07/2021 1.13     MONOCYTE # 11/07/2021 0.64     EOSINOPHIL # 11/07/2021 0.12     BASOPHIL # 11/07/2021 <0.10     IMMATURE GRANULOCYTE % 11/07/2021 0     IMMATURE GRANULOCYTE # 11/07/2021 <0.10     COLOR 11/07/2021 Yellow     APPEARANCE 11/07/2021 Clear     SPECIFIC GRAVITY 11/07/2021 1.010     PH 11/07/2021 7.0     LEUKOCYTES 11/07/2021 Negative     NITRITE 11/07/2021 Negative     PROTEIN 11/07/2021 Negative     GLUCOSE 11/07/2021 Negative     KETONES 11/07/2021 Negative     UROBILINOGEN 11/07/2021 0.2     BILIRUBIN 11/07/2021 Negative     BLOOD 11/07/2021 Negative     RBCS 11/07/2021 3-5     WBCS 11/07/2021 3-5     BACTERIA 11/07/2021 Occasional (A)     SQUAMOUS EPITHELIAL 11/07/2021 3-5     RAINBOW/EXTRA TUBE AUTO * 11/07/2021 Yes     RAINBOW/EXTRA TUBE AUTO * 11/07/2021 Yes     TROPONIN-I HS 11/07/2021 14.1 (HH)     SODIUM 11/08/2021 141     POTASSIUM 11/08/2021 3.8     CHLORIDE 11/08/2021 107     CO2 TOTAL 11/08/2021 25     ANION GAP 11/08/2021 9     BUN 11/08/2021 12     CREATININE 11/08/2021 1.15 (H)     BUN/CREA RATIO 11/08/2021 10     ESTIMATED GFR 11/08/2021 44 (L)     ALBUMIN 11/08/2021 3.0 (L)     CALCIUM 11/08/2021 9.1     GLUCOSE 11/08/2021  153 (H)     ALKALINE PHOSPHATASE 11/08/2021 95     ALT (SGPT) 11/08/2021 29 (H)  AST (SGOT)  11/08/2021 34     BILIRUBIN TOTAL 11/08/2021 0.7     PROTEIN TOTAL 11/08/2021 6.5     WBC 11/08/2021 4.3     RBC 11/08/2021 4.26     HGB 11/08/2021 10.6 (L)     HCT 11/08/2021 34.9     MCV 11/08/2021 81.9     MCH 11/08/2021 24.9 (L)     MCHC 11/08/2021 30.4 (L)     RDW-CV 11/08/2021 16.7 (H)     PLATELETS 11/08/2021 118 (L)     MPV 11/08/2021 10.8     NEUTROPHIL % 11/08/2021 58     LYMPHOCYTE % 11/08/2021 29     MONOCYTE % 11/08/2021 9     EOSINOPHIL % 11/08/2021 3     BASOPHIL % 11/08/2021 1     NEUTROPHIL # 11/08/2021 2.51     LYMPHOCYTE # 11/08/2021 1.23     MONOCYTE # 11/08/2021 0.39     EOSINOPHIL # 11/08/2021 0.13     BASOPHIL # 11/08/2021 <0.10     IMMATURE GRANULOCYTE % 11/08/2021 0     IMMATURE GRANULOCYTE # 11/08/2021 <0.10     TROPONIN-I HS 11/08/2021 16.5 (HH)         Imaging Studies:    XR AP MOBILE CHEST   Final Result by Edi, Radresults In (05/31 4097)   FINDINGS: Impression: Low lung volumes. No evidence of pneumonia, pneumothorax or pleural effusion. Cardiomediastinal silhouette is unchanged in appearance.                        Radiologist location ID: [id number]         CT BRAIN WO IV CONTRAST   Final Result by Edi, Radresults In (05/30 1825)   No evidence of acute infarct or intracranial hemorrhage.               The CT exam was performed using one or more of the following dose reduction techniques: Automated exposure control, adjustment of the mA and/or kV according to the patient's size, or use of iterative reconstruction technique.               Radiologist location ID: [id number]         XR AP MOBILE CHEST   Final Result by Edi, Radresults In (05/30 1821)   No acute cardiopulmonary process.         JLB               Radiologist location ID: [id number]             Assessment/Plan:  Active Hospital Problems    Diagnosis    Primary Problem: Chest pain, rule out acute myocardial infarction     Vertigo    GERD (gastroesophageal reflux disease)    HTN (hypertension)       1. Chest pain rule out.  Patient is placed under hospitalist service on telemetry.  Cardiac enzymes have been followed every 8 hours for 3 total sets.  Closely follow with laboratory studies.  Repeat chest x-ray in a.m..  2. Vertigo.  Continue home medications.  Closely monitor.  Up with assistance to prevent falls.  3. GERD.  Chronic stable.  Continue PPI therapy.  4. Hypertension.  Continue amlodipine and metoprolol.  5. DVT prophylaxis.  Lovenox subQ daily.    Estimated Length of Stay:  1-2 days    I spent a total of (30) minutes in direct/indirect care of this patient including initial evaluation,  review of laboratory, radiology, diagnostic studies, review of medical record, order entry and coordination of care.     Ronnald Nian, PA-C    I personally saw and evaluated the patient as part of a shared service with an APP.    My substantive findings are:  SUBSTANTIVE FINDINGS: MDM (complete).  Patient admitted to the hospital for chest pain rule out MI.  Patient will be monitored on telemetry.  Serial cardiac enzymes.     I independently of the APP spent a total of (15) minutes in direct/indirect care of this patient including initial evaluation, review of laboratory, radiology, diagnostic studies, review of medical record, order entry and coordination of care.     Leonarda Salon, MD  11/08/2021, 18:17

## 2021-11-08 NOTE — Nurses Notes (Signed)
Patient discharged home with family.  AVS reviewed with patient/care giver.  A written copy of the AVS and discharge instructions was given to the patient/care giver.  Questions sufficiently answered as needed.  Patient/care giver encouraged to follow up with PCP as indicated.  In the event of an emergency, patient/care giver instructed to call 911 or go to the nearest emergency room.

## 2021-11-08 NOTE — Discharge Summary (Signed)
[facility]  DISCHARGE SUMMARY    PATIENT NAME:  Tricia Bailey, Tricia Bailey  MRN:  [id number]  DOB:  Jul 31, 1924    ENCOUNTER DATE:  11/07/2021  INPATIENT ADMISSION DATE:   DISCHARGE DATE:  11/08/2021    ATTENDING PHYSICIAN: Leonarda Salon, MD  SERVICE: [facility] HOSPITALIST  PRIMARY CARE PHYSICIAN: Naomie Dean, DO       No lay caregiver identified.      PRIMARY DISCHARGE DIAGNOSIS: Chest pain, rule out acute myocardial infarction  Active Hospital Problems    Diagnosis Date Noted    Vertigo [R42] 11/08/2021    GERD (gastroesophageal reflux disease) [K21.9]     HTN (hypertension) [I10]       Resolved Hospital Problems    Diagnosis     Principal Problem: Chest pain, rule out acute myocardial infarction [R07.9]      There are no active non-hospital problems to display for this patient.          Current Discharge Medication List        START taking these medications.        Details   clonazePAM 0.125 mg Tablet, Rapid Dissolve  Commonly known as: KLONOPIN   0.125 mg, Oral, EVERY 6 HOURS PRN  Qty: 20 Tablet  Refills: 0     Methylprednisolone 4 mg Tablets, Dose Pack  Commonly known as: MEDROL DOSEPACK   Take as instructed.  Qty: 21 Tablet  Refills: 0            CONTINUE these medications - NO CHANGES were made during your visit.        Details   albuterol sulfate 90 mcg/actuation oral inhaler  Commonly known as: PROVENTIL or VENTOLIN or PROAIR   1-2 Puffs, Inhalation, EVERY 6 HOURS PRN  Refills: 0     amLODIPine 2.5 mg Tablet  Commonly known as: NORVASC   2.5 mg, Oral, 2 TIMES DAILY  Refills: 0     cyclobenzaprine 10 mg Tablet  Commonly known as: FLEXERIL   10 mg, Oral, 3 TIMES DAILY  Refills: 0     HYDROcodone-acetaminophen 5-325 mg Tablet  Commonly known as: NORCO   1 Tablet, Oral, EVERY 6 HOURS PRN  Qty: 12 Tablet  Refills: 0     levothyroxine 25 mcg Tablet  Commonly known as: SYNTHROID   25 mcg, Oral, EVERY MORNING, Unsure of dose  Refills: 0     nebivoloL 10 mg Tablet  Commonly known as: BYSTOLIC   10  mg, Oral, 2 TIMES DAILY  Refills: 0     omeprazole 20 mg Capsule, Delayed Release(E.C.)  Commonly known as: PRILOSEC   20 mg, Oral, DAILY  Refills: 0     promethazine 25 mg Tablet  Commonly known as: PHENERGAN   25 mg, Oral, EVERY 6 HOURS PRN  Refills: 0            Discharge med list refreshed?  YES     Allergies   Allergen Reactions    Cephalexin  Other Adverse Reaction (Add comment)     unknown    Hctz [Hydrochlorothiazide]  Other Adverse Reaction (Add comment)     unknown    Macrodantin [Nitrofurantoin]  Other Adverse Reaction (Add comment)     unknown    Minocin [Minocycline]  Other Adverse Reaction (Add comment)     unknown       HOSPITAL PROCEDURE(S):   No orders of the defined types were placed in this encounter.  REASON FOR HOSPITALIZATION AND HOSPITAL COURSE   BRIEF HPI:  This is a 88 y.o., female admitted for chest pain rule out.  Patient presented to the emergency department with complaints of dizziness and headache.  Symptoms have been present approximally 2 weeks.  Family was concerned she was going to fall.  Every time she would move or lean forward she became dizzy.  Patient is brought to the emergency room for evaluation.  In the emergency room patient laboratory studies and diagnostic imaging.  Chest x-ray showed no acute process.  CT of the brain shows no acute infarct or intracranial hemorrhage.  Chest x-ray shows no pneumonia pneumothorax or pleural effusion.  Laboratory studies show troponin of 14.1 with normal renal function electrolytes.  Urinalysis was unremarkable.  White count was 5.5.  Patient was placed under hospitalist service for continue medical management.    BRIEF HOSPITAL NARRATIVE:       Patient was placed under hospitalist service on telemetry.  Cardiac enzymes have been followed every 8 hours for 3 total sets.  Troponin has remained stable with initial troponin being 14.1 and final troponin being 16.5.  Patient denies all chest pain.  Vertigo has resolved.  Patient is able  to sit up on the edge of the bed and move head in all directions including leaning forward.  Patient has no dizziness or headache at this time.  Is felt patient could have a viral etiology to her vertigo.  She will be started on a very low dose of Klonopin along with a tapered dose of steroids.  Patient should take these medications as prescribed.  She is to see her PCP in the next week.  Patient is be closely monitored while taking clonazepam to help prevent falls.  Family was informed these can make her off balance when taking benzodiazepines.  Patient is stable and appropriate to be discharged home.  She needs to see her PCP in the next week.  She is return for any symptoms or concerns she may have.  She agrees with plan.  Patient discharged home in stable condition.      CONDITION ON DISCHARGE:  A. Ambulation: Full ambulation  B. Self-care Ability: Complete  C. Cognitive Status Alert and Oriented x 3  D. Code status at discharge:       LINES/DRAINS/WOUNDS AT DISCHARGE:   Patient Lines/Drains/Airways Status       Active Line / Dialysis Catheter / Dialysis Graft / Drain / Airway / Wound       None                    DISCHARGE DISPOSITION:  Home discharge     DISCHARGE INSTRUCTIONS:  Post-Discharge Follow Up Appointments       Schedule an appointment with Naomie Dean, DO as soon as possible for a visit in 1 week(s)    Phone: [phone]    Where: [address], [address], [place] [address]             DISCHARGE INSTRUCTION - DIET     Diet: RESUME HOME DIET      DISCHARGE INSTRUCTION - ACTIVITY     Activity: AS TOLERATED      DISCHARGE INSTRUCTION - MISC    Take medications as prescribed  See PCP in the next week  Return to the emergency room for fever, chills, shortness a breath or symptoms of concern  Closely monitor patient when taking clonazepam to prevent falls  [name], PA-C    Copies sent to Care Team         Relationship Specialty Notifications Start End    Naomie DeanMurphy, Jessica L, DO PCP  - General EXTERNAL  11/04/19     Phone: [phone] Fax: [phone]         [address] [place] [phone]            Referring providers can utilize [email or web address] to access their referred [facility] patient's information.                          I personally saw and evaluated the patient as part of a shared service with an APP.    My substantive findings are:  SUBSTANTIVE FINDINGS: MDM (complete).  Patient admitted to the hospital for chest pain rule out MI. patient monitored on telemetry.  Serial cardiac enzymes were obtained.  All 3 sets were in the negative range.  Patient's dizziness has improved.  Patient be discharged home at this time.  She will follow up with PCP in 1 week.     I independently of the APP spent a total of (15) minutes in direct/indirect care of this patient including initial evaluation, review of laboratory, radiology, diagnostic studies, review of medical record, order entry and coordination of care.     Leonarda SalonJeffrey Rigley Niess, MD  11/08/2021, 18:19

## 2022-03-28 ENCOUNTER — Other Ambulatory Visit: Payer: Self-pay

## 2022-03-28 NOTE — ED Triage Notes (Addendum)
Patient fell this morning, it was not witnessed. Patient told her niece that she did not hit her head. Patient started vomiting this evening around 1900. Patient took a phenergan about 1800. Family says the vomit looked like coffee grounds.

## 2022-03-29 ENCOUNTER — Emergency Department (HOSPITAL_COMMUNITY): Payer: Medicare PPO

## 2022-03-29 ENCOUNTER — Inpatient Hospital Stay (HOSPITAL_COMMUNITY): Payer: Medicare PPO | Admitting: Family Medicine

## 2022-03-29 ENCOUNTER — Encounter (HOSPITAL_COMMUNITY): Payer: Self-pay

## 2022-03-29 ENCOUNTER — Inpatient Hospital Stay
Admission: EM | Admit: 2022-03-29 | Discharge: 2022-03-31 | DRG: 378 | Disposition: A | Discharge status: Home / Self Care | Payer: Medicare PPO | Attending: Family Medicine | Admitting: Family Medicine

## 2022-03-29 DIAGNOSIS — K2901 Acute gastritis with bleeding: Principal | ICD-10-CM | POA: Diagnosis present

## 2022-03-29 DIAGNOSIS — Z79899 Other long term (current) drug therapy: Secondary | ICD-10-CM

## 2022-03-29 DIAGNOSIS — D62 Acute posthemorrhagic anemia: Secondary | ICD-10-CM | POA: Diagnosis present

## 2022-03-29 DIAGNOSIS — J449 Chronic obstructive pulmonary disease, unspecified: Secondary | ICD-10-CM | POA: Diagnosis present

## 2022-03-29 DIAGNOSIS — K7460 Unspecified cirrhosis of liver: Secondary | ICD-10-CM | POA: Insufficient documentation

## 2022-03-29 DIAGNOSIS — K922 Gastrointestinal hemorrhage, unspecified: Secondary | ICD-10-CM | POA: Diagnosis present

## 2022-03-29 DIAGNOSIS — Z23 Encounter for immunization: Secondary | ICD-10-CM | POA: Insufficient documentation

## 2022-03-29 DIAGNOSIS — R531 Weakness: Secondary | ICD-10-CM | POA: Insufficient documentation

## 2022-03-29 DIAGNOSIS — Z79890 Hormone replacement therapy (postmenopausal): Secondary | ICD-10-CM

## 2022-03-29 DIAGNOSIS — W19XXXA Unspecified fall, initial encounter: Secondary | ICD-10-CM | POA: Diagnosis present

## 2022-03-29 DIAGNOSIS — S81812A Laceration without foreign body, left lower leg, initial encounter: Secondary | ICD-10-CM | POA: Insufficient documentation

## 2022-03-29 DIAGNOSIS — K219 Gastro-esophageal reflux disease without esophagitis: Secondary | ICD-10-CM | POA: Diagnosis present

## 2022-03-29 DIAGNOSIS — Z20822 Contact with and (suspected) exposure to covid-19: Secondary | ICD-10-CM | POA: Insufficient documentation

## 2022-03-29 DIAGNOSIS — E039 Hypothyroidism, unspecified: Secondary | ICD-10-CM | POA: Diagnosis present

## 2022-03-29 DIAGNOSIS — R161 Splenomegaly, not elsewhere classified: Secondary | ICD-10-CM | POA: Insufficient documentation

## 2022-03-29 DIAGNOSIS — I714 Abdominal aortic aneurysm, without rupture, unspecified: Secondary | ICD-10-CM | POA: Insufficient documentation

## 2022-03-29 DIAGNOSIS — I10 Essential (primary) hypertension: Secondary | ICD-10-CM | POA: Diagnosis present

## 2022-03-29 DIAGNOSIS — R42 Dizziness and giddiness: Secondary | ICD-10-CM | POA: Diagnosis present

## 2022-03-29 DIAGNOSIS — Z1152 Encounter for screening for COVID-19: Secondary | ICD-10-CM

## 2022-03-29 LAB — COMPREHENSIVE METABOLIC PANEL, NON-FASTING
ALBUMIN: 3.2 g/dL — ABNORMAL LOW (ref 3.4–4.8)
ALKALINE PHOSPHATASE: 108 U/L (ref 55–145)
ALT (SGPT): 33 U/L — ABNORMAL HIGH (ref 8–22)
ANION GAP: 10 mmol/L (ref 4–13)
AST (SGOT): 62 U/L — ABNORMAL HIGH (ref 8–45)
BILIRUBIN TOTAL: 0.6 mg/dL (ref 0.3–1.3)
BUN/CREA RATIO: 31 — ABNORMAL HIGH (ref 6–22)
BUN: 33 mg/dL — ABNORMAL HIGH (ref 8–25)
CALCIUM: 9.1 mg/dL (ref 8.6–10.3)
CHLORIDE: 104 mmol/L (ref 96–111)
CO2 TOTAL: 24 mmol/L (ref 23–31)
CREATININE: 1.06 mg/dL — ABNORMAL HIGH (ref 0.60–1.05)
ESTIMATED GFR - FEMALE: 48 mL/min/BSA — ABNORMAL LOW (ref >=60)
GLUCOSE: 146 mg/dL — ABNORMAL HIGH (ref 65–125)
POTASSIUM: 4.9 mmol/L (ref 3.5–5.1)
PROTEIN TOTAL: 6.9 g/dL (ref 6.0–8.0)
SODIUM: 138 mmol/L (ref 136–145)

## 2022-03-29 LAB — CBC WITH DIFF
BASOPHIL #: <0.1 10*3/uL (ref <=0.20)
BASOPHIL #: <0.1 10*3/uL (ref <=0.20)
BASOPHIL %: 0 %
BASOPHIL %: 0 %
EOSINOPHIL #: <0.1 10*3/uL (ref <=0.50)
EOSINOPHIL #: <0.1 10*3/uL (ref <=0.50)
EOSINOPHIL %: 0 %
EOSINOPHIL %: 1 %
HCT: 27.9 % — ABNORMAL LOW (ref 34.8–46.0)
HCT: 32 % — ABNORMAL LOW (ref 34.8–46.0)
HGB: 8.6 g/dL — ABNORMAL LOW (ref 11.5–16.0)
HGB: 9.5 g/dL — ABNORMAL LOW (ref 11.5–16.0)
IMMATURE GRANULOCYTE #: <0.1 10*3/uL (ref <0.10)
IMMATURE GRANULOCYTE #: <0.1 10*3/uL (ref <0.10)
IMMATURE GRANULOCYTE %: 0 % (ref 0–1)
IMMATURE GRANULOCYTE %: 0 % (ref 0–1)
LYMPHOCYTE #: 1.05 10*3/uL (ref 1.00–4.80)
LYMPHOCYTE #: 1.6 10*3/uL (ref 1.00–4.80)
LYMPHOCYTE %: 15 %
LYMPHOCYTE %: 28 %
MCH: 23.2 pg — ABNORMAL LOW (ref 26.0–32.0)
MCH: 23.7 pg — ABNORMAL LOW (ref 26.0–32.0)
MCHC: 29.7 g/dL — ABNORMAL LOW (ref 31.0–35.5)
MCHC: 30.8 g/dL — ABNORMAL LOW (ref 31.0–35.5)
MCV: 76.9 fL — ABNORMAL LOW (ref 78.0–100.0)
MCV: 78 fL (ref 78.0–100.0)
MONOCYTE #: 0.41 10*3/uL (ref 0.20–1.10)
MONOCYTE #: 0.57 10*3/uL (ref 0.20–1.10)
MONOCYTE %: 10 %
MONOCYTE %: 6 %
MPV: 11.2 fL (ref 8.7–12.5)
MPV: 11.2 fL (ref 8.7–12.5)
NEUTROPHIL #: 3.5 10*3/uL (ref 1.50–7.70)
NEUTROPHIL #: 5.72 10*3/uL (ref 1.50–7.70)
NEUTROPHIL %: 61 %
NEUTROPHIL %: 79 %
PLATELETS: 115 10*3/uL — ABNORMAL LOW (ref 150–400)
PLATELETS: 140 10*3/uL — ABNORMAL LOW (ref 150–400)
RBC: 3.63 10*6/uL — ABNORMAL LOW (ref 3.85–5.22)
RBC: 4.1 10*6/uL (ref 3.85–5.22)
RDW-CV: 17.5 % — ABNORMAL HIGH (ref 11.5–15.5)
RDW-CV: 17.6 % — ABNORMAL HIGH (ref 11.5–15.5)
WBC: 5.8 10*3/uL (ref 3.7–11.0)
WBC: 7.3 10*3/uL (ref 3.7–11.0)

## 2022-03-29 LAB — COVID-19 ~~LOC~~ MOLECULAR LAB TESTING: SARS-CoV-2: NOT DETECTED

## 2022-03-29 LAB — BLUE TOP TUBE

## 2022-03-29 LAB — LIPASE: LIPASE: 40 U/L (ref 10–60)

## 2022-03-29 LAB — BASIC METABOLIC PANEL
ANION GAP: 8 mmol/L (ref 4–13)
BUN/CREA RATIO: 31 — ABNORMAL HIGH (ref 6–22)
BUN: 29 mg/dL — ABNORMAL HIGH (ref 8–25)
CALCIUM: 8.6 mg/dL (ref 8.6–10.3)
CHLORIDE: 108 mmol/L (ref 96–111)
CO2 TOTAL: 25 mmol/L (ref 23–31)
CREATININE: 0.94 mg/dL (ref 0.60–1.05)
ESTIMATED GFR - FEMALE: 56 mL/min/BSA — ABNORMAL LOW (ref >=60)
GLUCOSE: 95 mg/dL (ref 65–125)
POTASSIUM: 4.1 mmol/L (ref 3.5–5.1)
SODIUM: 141 mmol/L (ref 136–145)

## 2022-03-29 LAB — GRAY TOP TUBE

## 2022-03-29 LAB — GOLD TOP TUBE

## 2022-03-29 MED ORDER — MAGNESIUM HYDROXIDE 400 MG/5 ML ORAL SUSPENSION
15.0000 mL | Freq: Every day | ORAL | Status: DC | PRN
Start: 2022-03-29 — End: 2022-03-31

## 2022-03-29 MED ORDER — CLONAZEPAM 0.25 MG HALF TABLET
0.2500 mg | ORAL_TABLET | Freq: Two times a day (BID) | ORAL | Status: DC | PRN
Start: 2022-03-29 — End: 2022-03-29

## 2022-03-29 MED ORDER — AMLODIPINE 5 MG TABLET
2.5000 mg | ORAL_TABLET | Freq: Two times a day (BID) | ORAL | Status: DC
Start: 2022-03-29 — End: 2022-03-31
  Administered 2022-03-29 – 2022-03-31 (×5): 2.5 mg via ORAL
  Filled 2022-03-29 (×5): qty 1

## 2022-03-29 MED ORDER — SODIUM CHLORIDE 0.9 % INTRAVENOUS SOLUTION
INTRAVENOUS | Status: DC
Start: 2022-03-29 — End: 2022-03-31
  Filled 2022-03-29 (×4): qty 20

## 2022-03-29 MED ORDER — LEVOTHYROXINE 25 MCG TABLET
25.0000 ug | ORAL_TABLET | Freq: Every morning | ORAL | Status: DC
Start: 2022-03-29 — End: 2022-03-31
  Administered 2022-03-29 – 2022-03-31 (×3): 25 ug via ORAL
  Filled 2022-03-29 (×3): qty 1

## 2022-03-29 MED ORDER — SODIUM CHLORIDE 0.9 % INTRAVENOUS SOLUTION
INTRAVENOUS | Status: DC
Start: 2022-03-29 — End: 2022-03-31

## 2022-03-29 MED ORDER — SUCRALFATE 1 GRAM TABLET
1.0000 g | ORAL_TABLET | Freq: Four times a day (QID) | ORAL | Status: DC
Start: 2022-03-29 — End: 2022-03-31
  Administered 2022-03-29 (×2): 1 g via ORAL
  Administered 2022-03-29: 0 g via ORAL
  Administered 2022-03-30 – 2022-03-31 (×6): 1 g via ORAL
  Filled 2022-03-29 (×8): qty 1

## 2022-03-29 MED ORDER — IOPAMIDOL 300 MG IODINE/ML (61 %) INTRAVENOUS SOLUTION
100.0000 mL | INTRAVENOUS | Status: AC
Start: 2022-03-29 — End: 2022-03-29
  Administered 2022-03-29: 100 mL via INTRAVENOUS
  Filled 2022-03-29: qty 100

## 2022-03-29 MED ORDER — PROMETHAZINE 25 MG TABLET
25.0000 mg | ORAL_TABLET | Freq: Four times a day (QID) | ORAL | Status: DC | PRN
Start: 2022-03-29 — End: 2022-03-31

## 2022-03-29 MED ORDER — HYDROCODONE 5 MG-ACETAMINOPHEN 325 MG TABLET
1.0000 | ORAL_TABLET | Freq: Four times a day (QID) | ORAL | Status: DC | PRN
Start: 2022-03-29 — End: 2022-03-31
  Administered 2022-03-29 – 2022-03-31 (×4): 1 via ORAL
  Filled 2022-03-29 (×5): qty 1

## 2022-03-29 MED ORDER — SODIUM CHLORIDE 0.9 % (FLUSH) INJECTION SYRINGE
10.0000 mL | INJECTION | INTRAMUSCULAR | Status: DC | PRN
Start: 2022-03-29 — End: 2022-03-31

## 2022-03-29 MED ORDER — SODIUM CHLORIDE 0.9 % (FLUSH) INJECTION SYRINGE
10.0000 mL | INJECTION | Freq: Three times a day (TID) | INTRAMUSCULAR | Status: DC
Start: 2022-03-29 — End: 2022-03-31
  Administered 2022-03-29 – 2022-03-30 (×4): 0 mL
  Administered 2022-03-30: 10 mL
  Administered 2022-03-30 – 2022-03-31 (×2): 0 mL

## 2022-03-29 MED ORDER — LORAZEPAM 2 MG/ML INJECTION WRAPPER
1.0000 mg | INTRAMUSCULAR | Status: AC
Start: 2022-03-29 — End: 2022-03-29
  Administered 2022-03-29: 1 mg via INTRAVENOUS
  Filled 2022-03-29: qty 1

## 2022-03-29 MED ORDER — CLONAZEPAM 1 MG TABLET
0.5000 mg | ORAL_TABLET | Freq: Two times a day (BID) | ORAL | Status: DC | PRN
Start: 2022-03-29 — End: 2022-03-31
  Administered 2022-03-29: 0.5 mg via ORAL
  Filled 2022-03-29: qty 1

## 2022-03-29 MED ORDER — SODIUM CHLORIDE 0.9 % INTRAVENOUS SOLUTION
40.0000 mg | INTRAVENOUS | Status: AC
Start: 2022-03-29 — End: 2022-03-29
  Administered 2022-03-29: 40 mg via INTRAVENOUS
  Administered 2022-03-29: 0 mg via INTRAVENOUS
  Filled 2022-03-29: qty 10

## 2022-03-29 MED ORDER — ONDANSETRON HCL (PF) 4 MG/2 ML INJECTION SOLUTION
4.0000 mg | Freq: Four times a day (QID) | INTRAMUSCULAR | Status: DC | PRN
Start: 2022-03-29 — End: 2022-03-31

## 2022-03-29 MED ORDER — SODIUM CHLORIDE 0.9 % IV BOLUS
1000.0000 mL | INJECTION | Status: AC
Start: 2022-03-29 — End: 2022-03-29
  Administered 2022-03-29: 0 mL via INTRAVENOUS
  Administered 2022-03-29: 1000 mL via INTRAVENOUS

## 2022-03-29 MED ORDER — NEBIVOLOL 10 MG TABLET
10.0000 mg | ORAL_TABLET | Freq: Two times a day (BID) | ORAL | Status: DC
Start: 2022-03-29 — End: 2022-03-31
  Administered 2022-03-29 – 2022-03-31 (×5): 0 mg via ORAL

## 2022-03-29 MED ORDER — PANTOPRAZOLE 40 MG TABLET,DELAYED RELEASE
40.0000 mg | DELAYED_RELEASE_TABLET | Freq: Every day | ORAL | Status: DC
Start: 2022-03-29 — End: 2022-03-30
  Administered 2022-03-29 – 2022-03-30 (×2): 0 mg via ORAL

## 2022-03-29 MED ORDER — DIPHTH,PERTUSSIS(ACEL),TETANUS 2.5 LF UNIT-8 MCG-5 LF/0.5ML IM SYRINGE
0.5000 mL | INJECTION | INTRAMUSCULAR | Status: AC
Start: 2022-03-29 — End: 2022-03-29
  Administered 2022-03-29: 0.5 mL via INTRAMUSCULAR
  Filled 2022-03-29: qty 0.5

## 2022-03-29 NOTE — Consults (Signed)
[facility]  General Surgery  Consultation    Date of Service:  03/29/2022  Tricia Bailey, Tricia Bailey, 88 y.o. female  Date of Admission:  03/29/2022  Date of Birth:  January 24, 1925  PCP: Tera Partridge, DO (Inactive)    Chief Complaint:  Possible GI bleed    HPI:  Tricia Bailey is a 88 y.o. White female who is admitted for possible GI bleed.  The patient experienced vomiting yesterday.  Family was concerned she had coffee-ground emesis.  Patient was brought to the emergency department for evaluation.  She was found to be mildly anemic and BUN to creatinine ratio was elevated.  She was admitted by the hospitalist service.  She is receiving IV proton pump inhibitor.  She has been kept NPO.  She denies abdominal pain.  She denies nausea or vomiting.  She has no complaints this morning and reports she wants to go home today.    Past Medical History:   Diagnosis Date    Arthropathy     COPD (chronic obstructive pulmonary disease) (CMS HCC)     GERD (gastroesophageal reflux disease)     HTN (hypertension)       History reviewed. No pertinent surgical history.   Social History     Tobacco Use    Smoking status: Never    Smokeless tobacco: Never   Vaping Use    Vaping Use: Never used   Substance Use Topics    Alcohol use: Never    Drug use: Never       Family Medical History:    None        Medications Prior to Admission       Prescriptions    albuterol sulfate (PROVENTIL OR VENTOLIN OR PROAIR) 90 mcg/actuation Inhalation HFA Aerosol Inhaler    Take 1-2 Puffs by inhalation Every 6 hours as needed    amLODIPine (NORVASC) 2.5 mg Oral Tablet    Take 2.5 mg by mouth Twice daily    clonazePAM (KLONOPIN) 0.125 mg Oral Tablet, Rapid Dissolve    Take 1 Tablet (0.125 mg total) by mouth Every 6 hours as needed for Anxiety or Other (dizziness)    cyclobenzaprine (FLEXERIL) 10 mg Oral Tablet    Take 10 mg by mouth Three times a day    HYDROcodone-acetaminophen (NORCO) 5-325 mg Oral Tablet    Take 1 Tablet by mouth  Every 6 hours as needed for Pain    levothyroxine (SYNTHROID) 25 mcg Oral Tablet    Take 25 mcg by mouth Every morning Unsure of dose    nebivoloL (BYSTOLIC) 10 mg Oral Tablet    Take 10 mg by mouth Twice daily    omeprazole (PRILOSEC) 20 mg Oral Capsule, Delayed Release(E.C.)    Take 20 mg by mouth Once a day    promethazine (PHENERGAN) 25 mg Oral Tablet    Take 25 mg by mouth Every 6 hours as needed for Nausea/Vomiting           Allergies   Allergen Reactions    Cephalexin  Other Adverse Reaction (Add comment)     unknown    Hctz [Hydrochlorothiazide]  Other Adverse Reaction (Add comment)     unknown    Macrodantin [Nitrofurantoin]  Other Adverse Reaction (Add comment)     unknown    Minocin [Minocycline]  Other Adverse Reaction (Add comment)     unknown          Review of Systems   Constitutional:  Negative  for chills, fever and weight loss.   HENT:  Negative for hearing loss and tinnitus.    Eyes:  Negative for blurred vision, double vision and pain.   Respiratory:  Negative for cough, hemoptysis and shortness of breath.    Cardiovascular:  Negative for chest pain, palpitations, claudication and leg swelling.   Gastrointestinal:  Positive for nausea and vomiting. Negative for abdominal pain, blood in stool, constipation, diarrhea, heartburn and melena.        Possible coffee-ground emesis per the family based on chart review   Genitourinary:  Negative for dysuria, frequency and hematuria.   Musculoskeletal:  Negative for back pain and neck pain.   Skin:  Negative for itching and rash.   Neurological:  Negative for speech change, focal weakness and weakness.   Endo/Heme/Allergies:  Negative for environmental allergies. Does not bruise/bleed easily.   Psychiatric/Behavioral:  Negative for depression and hallucinations.           Filed Vitals:    03/29/22 0230 03/29/22 0400 03/29/22 0409 03/29/22 0725   BP: (!) 151/78  (!) 133/59 (!) 165/73   Pulse: 80  77 79   Resp: 18  18 (!) 24   Temp: 36.1 C (97 F)  36.7 C  (98.1 F) 36.4 C (97.5 F)   SpO2: 94% 95%          Physical Exam  Vitals and nursing note reviewed.   Constitutional:       Appearance: Normal appearance.   HENT:      Head: Normocephalic.   Eyes:      Pupils: Pupils are equal, round, and reactive to light.   Cardiovascular:      Rate and Rhythm: Normal rate.   Pulmonary:      Effort: Pulmonary effort is normal. No respiratory distress.   Abdominal:      General: There is no distension.      Palpations: Abdomen is soft.      Tenderness: There is no abdominal tenderness. There is no guarding or rebound.   Musculoskeletal:         General: No deformity.      Cervical back: Normal range of motion.   Skin:     General: Skin is warm and dry.      Coloration: Skin is not jaundiced.   Neurological:      General: No focal deficit present.      Mental Status: She is alert and oriented to person, place, and time.   Psychiatric:         Mood and Affect: Mood normal.         Behavior: Behavior normal.          Laboratory Data:     Results for orders placed or performed during the hospital encounter of 03/29/22 (from the past 24 hour(s))   COVID - 19 SCREENING - Symptomatic - PUI   Result Value Ref Range    SARS-CoV-2 Not Detected Not Detected   COMPREHENSIVE METABOLIC PANEL, NON-FASTING   Result Value Ref Range    SODIUM 138 136 - 145 mmol/L    POTASSIUM 4.9 3.5 - 5.1 mmol/L    CHLORIDE 104 96 - 111 mmol/L    CO2 TOTAL 24 23 - 31 mmol/L    ANION GAP 10 4 - 13 mmol/L    BUN 33 (H) 8 - 25 mg/dL    CREATININE 1.06 (H) 0.60 - 1.05 mg/dL    BUN/CREA RATIO 31 (H) 6 - 22  ALBUMIN 3.2 (L) 3.4 - 4.8 g/dL     CALCIUM 9.1 8.6 - 10.3 mg/dL    GLUCOSE 146 (H) 65 - 125 mg/dL    ALKALINE PHOSPHATASE 108 55 - 145 U/L    ALT (SGPT) 33 (H) 8 - 22 U/L    AST (SGOT)  62 (H) 8 - 45 U/L    BILIRUBIN TOTAL 0.6 0.3 - 1.3 mg/dL    PROTEIN TOTAL 6.9 6.0 - 8.0 g/dL    ESTIMATED GFR - FEMALE 48 (L) >=60 mL/min/BSA   LIPASE   Result Value Ref Range    LIPASE 40 10 - 60 U/L   CBC WITH DIFF   Result  Value Ref Range    WBC 7.3 3.7 - 11.0 x10^3/uL    RBC 4.10 3.85 - 5.22 x10^6/uL    HGB 9.5 (L) 11.5 - 16.0 g/dL    HCT 32.0 (L) 34.8 - 46.0 %    MCV 78.0 78.0 - 100.0 fL    MCH 23.2 (L) 26.0 - 32.0 pg    MCHC 29.7 (L) 31.0 - 35.5 g/dL    RDW-CV 17.5 (H) 11.5 - 15.5 %    PLATELETS 140 (L) 150 - 400 x10^3/uL    MPV 11.2 8.7 - 12.5 fL    NEUTROPHIL % 79 %    LYMPHOCYTE % 15 %    MONOCYTE % 6 %    EOSINOPHIL % 0 %    BASOPHIL % 0 %    NEUTROPHIL # 5.72 1.50 - 7.70 x10^3/uL    LYMPHOCYTE # 1.05 1.00 - 4.80 x10^3/uL    MONOCYTE # 0.41 0.20 - 1.10 x10^3/uL    EOSINOPHIL # <0.10 <=0.50 x10^3/uL    BASOPHIL # <0.10 <=0.20 x10^3/uL    IMMATURE GRANULOCYTE % 0 0 - 1 %    IMMATURE GRANULOCYTE # <0.10 <0.10 x10^3/uL   BLUE TOP TUBE   Result Value Ref Range    RAINBOW/EXTRA TUBE AUTO RESULT Yes    GOLD TOP TUBE   Result Value Ref Range    RAINBOW/EXTRA TUBE AUTO RESULT Yes    GRAY TOP TUBE   Result Value Ref Range    RAINBOW/EXTRA TUBE AUTO RESULT Yes    BASIC METABOLIC PANEL   Result Value Ref Range    SODIUM 141 136 - 145 mmol/L    POTASSIUM 4.1 3.5 - 5.1 mmol/L    CHLORIDE 108 96 - 111 mmol/L    CO2 TOTAL 25 23 - 31 mmol/L    ANION GAP 8 4 - 13 mmol/L    CALCIUM 8.6 8.6 - 10.3 mg/dL    GLUCOSE 95 65 - 125 mg/dL    BUN 29 (H) 8 - 25 mg/dL    CREATININE 0.94 0.60 - 1.05 mg/dL    BUN/CREA RATIO 31 (H) 6 - 22    ESTIMATED GFR - FEMALE 56 (L) >=60 mL/min/BSA   CBC WITH DIFF   Result Value Ref Range    WBC 5.8 3.7 - 11.0 x10^3/uL    RBC 3.63 (L) 3.85 - 5.22 x10^6/uL    HGB 8.6 (L) 11.5 - 16.0 g/dL    HCT 27.9 (L) 34.8 - 46.0 %    MCV 76.9 (L) 78.0 - 100.0 fL    MCH 23.7 (L) 26.0 - 32.0 pg    MCHC 30.8 (L) 31.0 - 35.5 g/dL    RDW-CV 17.6 (H) 11.5 - 15.5 %    PLATELETS 115 (L) 150 - 400 x10^3/uL    MPV 11.2 8.7 -  12.5 fL    NEUTROPHIL % 61 %    LYMPHOCYTE % 28 %    MONOCYTE % 10 %    EOSINOPHIL % 1 %    BASOPHIL % 0 %    NEUTROPHIL # 3.50 1.50 - 7.70 x10^3/uL    LYMPHOCYTE # 1.60 1.00 - 4.80 x10^3/uL    MONOCYTE # 0.57 0.20 - 1.10  x10^3/uL    EOSINOPHIL # <0.10 <=0.50 x10^3/uL    BASOPHIL # <0.10 <=0.20 x10^3/uL    IMMATURE GRANULOCYTE % 0 0 - 1 %    IMMATURE GRANULOCYTE # <0.10 <0.10 x10^3/uL       Imaging Studies:    CT ABDOMEN PELVIS W IV CONTRAST   Final Result by Edi, Radresults In (10/19 0147)         1. Mild diffuse gastric wall thickening, correlate for gastritis.      2. Subacute appearing healing fractures of left anterolateral fifth, sixth, and seventh ribs.      3. Cirrhotic liver morphology with splenomegaly.      4. Increase in size of distal abdominal aortic aneurysm measuring 4.7 x 5 cm.                  The CT exam was performed using one or more of the following dose reduction techniques: Automated exposure control, adjustment of the mA and/or kV according to the patient's size, or use of iterative reconstruction technique.         Radiologist location ID: [id number]         CT BRAIN WO IV CONTRAST   Final Result by Edi, Radresults In (10/19 0128)   No acute intracranial process.               The CT exam was performed using one or more of the following dose reduction techniques: Automated exposure control, adjustment of the mA and/or kV according to the patient's size, or use of iterative reconstruction technique.               Radiologist location ID: [id number]              Assessment/Plan:  Active Hospital Problems    Diagnosis    Primary Problem: Acute upper GI bleed    Vertigo    GERD (gastroesophageal reflux disease)    HTN (hypertension)       CT suggests gastritis.  I would recommend increasing the patient's oral proton pump inhibitor to 40 mg once daily.  Patient can also start using Carafate 1 g 4 times daily before eating.  She is currently stable without evidence of ongoing bleeding.  At her advanced age I would recommend ongoing conservative management.  EGD is not recommended unless the patient shows evidence of ongoing bleeding.  She can start a clear liquid trial.  Additional care is at the discretion of  the primary admitting service.    Francene Boyers, DO

## 2022-03-29 NOTE — H&P (Signed)
[facility]  Hospitalist  History & Physical    Date of Service:  03/29/2022  Tricia Bailey, Tricia Bailey, 88 y.o. female  Date of Admission:  03/29/2022  Date of Birth:  December 28, 1924  PCP: Tricia Partridge, DO (Inactive)    Chief Complaint:  Vomiting, hematemesis, melena.    HPI:    Tricia Bailey is a 88 y.o. White female who is admitted for upper GI bleed.  The patient presented to the emergency department after having fallen at home.  She reports that she is had coffee-ground emesis and black colored stool over the last couple of days.  Family brought her in because they were concerned about internal bleeding.  Patient was anemic with a hemoglobin 9.5.  Patient was admitted to the hospital for further workup of her upper GI bleed    Past Medical History:   Diagnosis Date    Arthropathy     COPD (chronic obstructive pulmonary disease) (CMS HCC)     GERD (gastroesophageal reflux disease)     HTN (hypertension)       History reviewed. No pertinent surgical history.   Social History     Tobacco Use    Smoking status: Never    Smokeless tobacco: Never   Vaping Use    Vaping Use: Never used   Substance Use Topics    Alcohol use: Never    Drug use: Never       Family Medical History:    None        Medications Prior to Admission       Prescriptions    albuterol sulfate (PROVENTIL OR VENTOLIN OR PROAIR) 90 mcg/actuation Inhalation HFA Aerosol Inhaler    Take 1-2 Puffs by inhalation Every 6 hours as needed    amLODIPine (NORVASC) 2.5 mg Oral Tablet    Take 2.5 mg by mouth Twice daily    clonazePAM (KLONOPIN) 0.125 mg Oral Tablet, Rapid Dissolve    Take 1 Tablet (0.125 mg total) by mouth Every 6 hours as needed for Anxiety or Other (dizziness)    cyclobenzaprine (FLEXERIL) 10 mg Oral Tablet    Take 10 mg by mouth Three times a day    HYDROcodone-acetaminophen (NORCO) 5-325 mg Oral Tablet    Take 1 Tablet by mouth Every 6 hours as needed for Pain    levothyroxine (SYNTHROID) 25 mcg Oral Tablet    Take 25  mcg by mouth Every morning Unsure of dose    nebivoloL (BYSTOLIC) 10 mg Oral Tablet    Take 10 mg by mouth Twice daily    omeprazole (PRILOSEC) 20 mg Oral Capsule, Delayed Release(E.C.)    Take 20 mg by mouth Once a day    promethazine (PHENERGAN) 25 mg Oral Tablet    Take 25 mg by mouth Every 6 hours as needed for Nausea/Vomiting           Allergies   Allergen Reactions    Cephalexin  Other Adverse Reaction (Add comment)     unknown    Hctz [Hydrochlorothiazide]  Other Adverse Reaction (Add comment)     unknown    Macrodantin [Nitrofurantoin]  Other Adverse Reaction (Add comment)     unknown    Minocin [Minocycline]  Other Adverse Reaction (Add comment)     unknown          Review of Systems   Constitutional:  Negative for chills, fever and malaise/fatigue.   HENT:  Negative for congestion, ear pain, sore throat and  tinnitus.    Eyes:  Negative for blurred vision, photophobia and pain.   Respiratory:  Negative for cough, hemoptysis, sputum production, shortness of breath and wheezing.    Cardiovascular:  Negative for chest pain, palpitations, orthopnea, claudication, leg swelling and PND.   Gastrointestinal:  Positive for melena and vomiting. Negative for abdominal pain, diarrhea, heartburn and nausea.   Genitourinary:  Negative for dysuria, frequency and urgency.   Musculoskeletal:  Positive for falls. Negative for back pain, joint pain and myalgias.   Skin:  Negative for rash.   Neurological:  Negative for dizziness, sensory change, speech change, focal weakness, weakness and headaches.   Endo/Heme/Allergies:  Negative for environmental allergies. Does not bruise/bleed easily.   Psychiatric/Behavioral:  Negative for depression and substance abuse. The patient is not nervous/anxious.           Filed Vitals:    03/29/22 0230 03/29/22 0400 03/29/22 0409 03/29/22 0725   BP: (!) 151/78  (!) 133/59 (!) 165/73   Pulse: 80  77 79   Resp: 18  18 (!) 24   Temp: 36.1 C (97 F)  36.7 C (98.1 F) 36.4 C (97.5 F)   SpO2:  94% 95%         Physical Exam  Vitals and nursing note reviewed.   Constitutional:       General: She is awake. She is not in acute distress.     Appearance: Normal appearance. She is well-developed and normal weight. She is not ill-appearing.      Comments: Pleasant elderly female in no acute distress   HENT:      Head: Normocephalic and atraumatic.   Eyes:      Conjunctiva/sclera: Conjunctivae normal.      Pupils: Pupils are equal, round, and reactive to light.   Cardiovascular:      Rate and Rhythm: Normal rate and regular rhythm.      Heart sounds: Normal heart sounds. No murmur heard.     No friction rub. No gallop.   Pulmonary:      Effort: No respiratory distress.      Breath sounds: Normal breath sounds and air entry. No wheezing or rales.   Abdominal:      General: Bowel sounds are normal. There is no distension.      Palpations: Abdomen is soft. There is no mass.      Tenderness: There is no abdominal tenderness. There is no guarding or rebound.   Musculoskeletal:         General: No deformity. Normal range of motion.      Cervical back: Normal range of motion and neck supple.   Skin:     General: Skin is warm and dry.      Findings: No erythema or rash.   Neurological:      Mental Status: She is alert and oriented to person, place, and time. Mental status is at baseline.      Cranial Nerves: No cranial nerve deficit.   Psychiatric:         Mood and Affect: Affect normal.         Behavior: Behavior is cooperative.         Cognition and Memory: Cognition and memory normal.         Laboratory Data:     Results for orders placed or performed during the hospital encounter of 03/29/22 (from the past 24 hour(s))   COVID - 19 SCREENING - Symptomatic - PUI   Result  Value Ref Range    SARS-CoV-2 Not Detected Not Detected   COMPREHENSIVE METABOLIC PANEL, NON-FASTING   Result Value Ref Range    SODIUM 138 136 - 145 mmol/L    POTASSIUM 4.9 3.5 - 5.1 mmol/L    CHLORIDE 104 96 - 111 mmol/L    CO2 TOTAL 24 23 - 31 mmol/L     ANION GAP 10 4 - 13 mmol/L    BUN 33 (H) 8 - 25 mg/dL    CREATININE 1.06 (H) 0.60 - 1.05 mg/dL    BUN/CREA RATIO 31 (H) 6 - 22    ALBUMIN 3.2 (L) 3.4 - 4.8 g/dL     CALCIUM 9.1 8.6 - 10.3 mg/dL    GLUCOSE 146 (H) 65 - 125 mg/dL    ALKALINE PHOSPHATASE 108 55 - 145 U/L    ALT (SGPT) 33 (H) 8 - 22 U/L    AST (SGOT)  62 (H) 8 - 45 U/L    BILIRUBIN TOTAL 0.6 0.3 - 1.3 mg/dL    PROTEIN TOTAL 6.9 6.0 - 8.0 g/dL    ESTIMATED GFR - FEMALE 48 (L) >=60 mL/min/BSA   LIPASE   Result Value Ref Range    LIPASE 40 10 - 60 U/L   CBC WITH DIFF   Result Value Ref Range    WBC 7.3 3.7 - 11.0 x10^3/uL    RBC 4.10 3.85 - 5.22 x10^6/uL    HGB 9.5 (L) 11.5 - 16.0 g/dL    HCT 32.0 (L) 34.8 - 46.0 %    MCV 78.0 78.0 - 100.0 fL    MCH 23.2 (L) 26.0 - 32.0 pg    MCHC 29.7 (L) 31.0 - 35.5 g/dL    RDW-CV 17.5 (H) 11.5 - 15.5 %    PLATELETS 140 (L) 150 - 400 x10^3/uL    MPV 11.2 8.7 - 12.5 fL    NEUTROPHIL % 79 %    LYMPHOCYTE % 15 %    MONOCYTE % 6 %    EOSINOPHIL % 0 %    BASOPHIL % 0 %    NEUTROPHIL # 5.72 1.50 - 7.70 x10^3/uL    LYMPHOCYTE # 1.05 1.00 - 4.80 x10^3/uL    MONOCYTE # 0.41 0.20 - 1.10 x10^3/uL    EOSINOPHIL # <0.10 <=0.50 x10^3/uL    BASOPHIL # <0.10 <=0.20 x10^3/uL    IMMATURE GRANULOCYTE % 0 0 - 1 %    IMMATURE GRANULOCYTE # <0.10 <0.10 x10^3/uL   BLUE TOP TUBE   Result Value Ref Range    RAINBOW/EXTRA TUBE AUTO RESULT Yes    GOLD TOP TUBE   Result Value Ref Range    RAINBOW/EXTRA TUBE AUTO RESULT Yes    GRAY TOP TUBE   Result Value Ref Range    RAINBOW/EXTRA TUBE AUTO RESULT Yes    BASIC METABOLIC PANEL   Result Value Ref Range    SODIUM 141 136 - 145 mmol/L    POTASSIUM 4.1 3.5 - 5.1 mmol/L    CHLORIDE 108 96 - 111 mmol/L    CO2 TOTAL 25 23 - 31 mmol/L    ANION GAP 8 4 - 13 mmol/L    CALCIUM 8.6 8.6 - 10.3 mg/dL    GLUCOSE 95 65 - 125 mg/dL    BUN 29 (H) 8 - 25 mg/dL    CREATININE 0.94 0.60 - 1.05 mg/dL    BUN/CREA RATIO 31 (H) 6 - 22    ESTIMATED GFR - FEMALE 56 (L) >=60 mL/min/BSA   CBC  WITH DIFF   Result Value Ref  Range    WBC 5.8 3.7 - 11.0 x10^3/uL    RBC 3.63 (L) 3.85 - 5.22 x10^6/uL    HGB 8.6 (L) 11.5 - 16.0 g/dL    HCT 27.9 (L) 34.8 - 46.0 %    MCV 76.9 (L) 78.0 - 100.0 fL    MCH 23.7 (L) 26.0 - 32.0 pg    MCHC 30.8 (L) 31.0 - 35.5 g/dL    RDW-CV 17.6 (H) 11.5 - 15.5 %    PLATELETS 115 (L) 150 - 400 x10^3/uL    MPV 11.2 8.7 - 12.5 fL    NEUTROPHIL % 61 %    LYMPHOCYTE % 28 %    MONOCYTE % 10 %    EOSINOPHIL % 1 %    BASOPHIL % 0 %    NEUTROPHIL # 3.50 1.50 - 7.70 x10^3/uL    LYMPHOCYTE # 1.60 1.00 - 4.80 x10^3/uL    MONOCYTE # 0.57 0.20 - 1.10 x10^3/uL    EOSINOPHIL # <0.10 <=0.50 x10^3/uL    BASOPHIL # <0.10 <=0.20 x10^3/uL    IMMATURE GRANULOCYTE % 0 0 - 1 %    IMMATURE GRANULOCYTE # <0.10 <0.10 x10^3/uL       Imaging Studies:    CT ABDOMEN PELVIS W IV CONTRAST   Final Result by Edi, Radresults In (10/19 0147)         1. Mild diffuse gastric wall thickening, correlate for gastritis.      2. Subacute appearing healing fractures of left anterolateral fifth, sixth, and seventh ribs.      3. Cirrhotic liver morphology with splenomegaly.      4. Increase in size of distal abdominal aortic aneurysm measuring 4.7 x 5 cm.                  The CT exam was performed using one or more of the following dose reduction techniques: Automated exposure control, adjustment of the mA and/or kV according to the patient's size, or use of iterative reconstruction technique.         Radiologist location ID: [id number]         CT BRAIN WO IV CONTRAST   Final Result by Edi, Radresults In (10/19 0128)   No acute intracranial process.               The CT exam was performed using one or more of the following dose reduction techniques: Automated exposure control, adjustment of the mA and/or kV according to the patient's size, or use of iterative reconstruction technique.               Radiologist location ID: [id number]             Assessment/Plan:  Active Hospital Problems    Diagnosis    Primary Problem: Acute upper GI bleed    Vertigo     GERD (gastroesophageal reflux disease)    HTN (hypertension)       Upper GI bleed.  Will admit the patient to med surge.  Patient be started on Protonix drip.  Will consult surgery service.  Surgery recommends conservative management.  Start Carafate.  Monitor hemoglobin and hematocrit.  Conservative management.    GERD.  Treatment as above.    Hypothyroidism.  Synthroid.    Hypertension.  Continue amlodipine.    DVT prophylaxis.  Ted's and SCDs    Bethanie Dicker, MD

## 2022-03-29 NOTE — ED Nurses Note (Signed)
Report given to Chief Technology Officer at [employer].

## 2022-03-29 NOTE — ED Nurses Note (Signed)
Patient removed IV while in bathroom.  IV catheter intact and gauze applied to IV site.

## 2022-03-29 NOTE — Care Plan (Signed)
Problem: Pain Acute  Goal: Optimal Pain Control and Function  03/29/2022 0602 by Beverlee Nims, RN  Outcome: Ongoing (see interventions/notes)  03/29/2022 0513 by Beverlee Nims, RN  Outcome: Ongoing (see interventions/notes)  Intervention: Optimize Psychosocial Wellbeing  Recent Flowsheet Documentation  Taken 03/29/2022 0400 by Beverlee Nims, RN  Diversional Activities: television  Supportive Measures: active listening utilized     Problem: Gastrointestinal Bleeding  Goal: Optimal Coping with Acute Illness  03/29/2022 0602 by Beverlee Nims, RN  Outcome: Ongoing (see interventions/notes)  03/29/2022 0513 by Beverlee Nims, RN  Outcome: Ongoing (see interventions/notes)  Intervention: Optimize Psychosocial Response  Recent Flowsheet Documentation  Taken 03/29/2022 0400 by Beverlee Nims, RN  Supportive Measures: active listening utilized       Patient has done well since coming to Med/Surg.  Patient has remained NPO for surgical consult this morning.  Patient did receive CHG bath this a.m.  No complaints of pain, N/V.  Beverlee Nims, RN

## 2022-03-29 NOTE — Nurses Notes (Signed)
Pt lying awake in bed, no voiced complaints.  Denies pain or discomfort.

## 2022-03-29 NOTE — ED Provider Notes (Signed)
Emergency Department  [name]   03/28/2022     Tricia Bailey  Nov 08, 1924  88 y.o.  female  [place] [address]   [phone] (home)  PCP: Tera Partridge, DO (Inactive)   [id number]    Date of service:03/29/2022 00:04    Chief Complaint:   Chief Complaint   Patient presents with    Vomiting       Arrival: The patient arrived by Car     HPI: This is a 88 y.o. female who presents to the emergency department status post fall this morning.  Skin tear to left shin.  Unsure when last tetanus shot was.  Did not feel she really hurt herself.  This evening began having coffee-ground emesis.  Family concerned she may have broke something loose in her abdomen and have internal bleeding.  Denies any headache.  Severity is moderate.      Past Medical History:   Past Medical History:   Diagnosis Date    Arthropathy     COPD (chronic obstructive pulmonary disease) (CMS HCC)     GERD (gastroesophageal reflux disease)     HTN (hypertension)        Past Surgical History: History reviewed. No pertinent surgical history.    Social History:   Social History     Tobacco Use    Smoking status: Never    Smokeless tobacco: Never   Vaping Use    Vaping Use: Never used   Substance Use Topics    Alcohol use: Never    Drug use: Never        Family History:  No family history on file.        Medications Prior to Admission       Prescriptions    albuterol sulfate (PROVENTIL OR VENTOLIN OR PROAIR) 90 mcg/actuation Inhalation HFA Aerosol Inhaler    Take 1-2 Puffs by inhalation Every 6 hours as needed    amLODIPine (NORVASC) 2.5 mg Oral Tablet    Take 2.5 mg by mouth Twice daily    clonazePAM (KLONOPIN) 0.125 mg Oral Tablet, Rapid Dissolve    Take 1 Tablet (0.125 mg total) by mouth Every 6 hours as needed for Anxiety or Other (dizziness)    cyclobenzaprine (FLEXERIL) 10 mg Oral Tablet    Take 10 mg by mouth Three times a day    HYDROcodone-acetaminophen (NORCO) 5-325 mg Oral Tablet    Take 1 Tablet by mouth Every 6  hours as needed for Pain    levothyroxine (SYNTHROID) 25 mcg Oral Tablet    Take 25 mcg by mouth Every morning Unsure of dose    nebivoloL (BYSTOLIC) 10 mg Oral Tablet    Take 10 mg by mouth Twice daily    omeprazole (PRILOSEC) 20 mg Oral Capsule, Delayed Release(E.C.)    Take 20 mg by mouth Once a day    promethazine (PHENERGAN) 25 mg Oral Tablet    Take 25 mg by mouth Every 6 hours as needed for Nausea/Vomiting            Allergies:   Allergies   Allergen Reactions    Cephalexin  Other Adverse Reaction (Add comment)     unknown    Hctz [Hydrochlorothiazide]  Other Adverse Reaction (Add comment)     unknown    Macrodantin [Nitrofurantoin]  Other Adverse Reaction (Add comment)     unknown    Minocin [Minocycline]  Other Adverse Reaction (Add comment)     unknown  Above history reviewed with patient.  Allergies, medication list, reviewed.        Physical exam:  Body mass index is 24.33 kg/m.  ED Triage Vitals [03/28/22 2358]   BP (Non-Invasive) 131/65   Heart Rate 76   Respiratory Rate 20   Temperature 36.2 C (97.1 F)   SpO2 96 %   Weight 72.6 kg (160 lb)   Height 1.727 m (5\' 8" )     Constitutional: patient is pleasant cooperative and well-developed, well-nourished, and in no distress.   HENT:   Head: Normocephalic and atraumatic.   Right Ear: External ear normal.  TM normal  Left Ear: External ear normal.  TM normal  Nose: Nose normal.   Mouth/Throat: Oropharynx is clear and moist.   Eyes: Pupils are equal, round, and reactive to light. Conjunctivae and EOM are normal.   Neck: Normal range of motion. Neck supple.   Cardiovascular: Normal rate, regular rhythm, normal heart sounds and intact distal pulses.   Pulmonary/Chest: Effort normal and breath sounds normal.   Abdominal: Soft. Bowel sounds are normal.   Musculoskeletal: Normal range of motion.   Neurological:Patient is alert and cooperative.  Gait normal. GCS score is 15.   Skin:  Skin tear present to left shin with surrounding ecchymosis.  No active  bleeding.  No signs of infection  Psychiatric: Mood, memory, affect and judgment normal.    The following orders were placed after examining the patient :  Orders Placed This Encounter    [place] - 19 SCREENING - Symptomatic - PUI    CBC/DIFF    COMPREHENSIVE METABOLIC PANEL, NON-FASTING    LIPASE    CBC WITH DIFF    EXTRA TUBES    BLUE TOP TUBE    GOLD TOP TUBE    GRAY TOP TUBE    CBC/DIFF    BASIC METABOLIC PANEL    OCCULT BLOOD, STOOL    INSERT & MAINTAIN PERIPHERAL IV ACCESS    PERIPHERAL IV DRESSING CHANGE EVERY 96 HOURS AND PRN    PATIENT CLASS/LEVEL OF CARE DESIGNATION - SMR    NS bolus infusion 1,000 mL    diphtheria, pertussis-acell, tetanus (BOOSTRIX) IM injection    iopamidol (ISOVUE-300) 61% infusion    pantoprazole (PROTONIX) 40 mg in NS 100 mL IVPB    pantoprazole (PROTONIX) 80 mg in NS 100 mL (tot vol) infusion    magnesium hydroxide (MILK OF MAGNESIA) 400mg  per 19mL oral liquid    ondansetron (ZOFRAN) 2 mg/mL injection    NS flush syringe    NS flush syringe    NS premix infusion    LORazepam (ATIVAN) 2 mg/mL injection      CT ABDOMEN PELVIS W IV CONTRAST   Final Result         1. Mild diffuse gastric wall thickening, correlate for gastritis.      2. Subacute appearing healing fractures of left anterolateral fifth, sixth, and seventh ribs.      3. Cirrhotic liver morphology with splenomegaly.      4. Increase in size of distal abdominal aortic aneurysm measuring 4.7 x 5 cm.                  The CT exam was performed using one or more of the following dose reduction techniques: Automated exposure control, adjustment of the mA and/or kV according to the patient's size, or use of  iterative reconstruction technique.         Radiologist location ID: [id number]         CT BRAIN WO IV CONTRAST   Final Result   No acute intracranial process.               The CT exam was performed using one or more of the following dose reduction techniques:  Automated exposure control, adjustment of the mA and/or kV according to the patient's size, or use of iterative reconstruction technique.               Radiologist location ID: [id number]            Results for orders placed or performed during the hospital encounter of 03/29/22 (from the past 12 hour(s))   COVID - 19 SCREENING - Symptomatic - PUI   Result Value Ref Range    SARS-CoV-2 Not Detected Not Detected   COMPREHENSIVE METABOLIC PANEL, NON-FASTING   Result Value Ref Range    SODIUM 138 136 - 145 mmol/L    POTASSIUM 4.9 3.5 - 5.1 mmol/L    CHLORIDE 104 96 - 111 mmol/L    CO2 TOTAL 24 23 - 31 mmol/L    ANION GAP 10 4 - 13 mmol/L    BUN 33 (H) 8 - 25 mg/dL    CREATININE 1.06 (H) 0.60 - 1.05 mg/dL    BUN/CREA RATIO 31 (H) 6 - 22    ALBUMIN 3.2 (L) 3.4 - 4.8 g/dL     CALCIUM 9.1 8.6 - 10.3 mg/dL    GLUCOSE 146 (H) 65 - 125 mg/dL    ALKALINE PHOSPHATASE 108 55 - 145 U/L    ALT (SGPT) 33 (H) 8 - 22 U/L    AST (SGOT)  62 (H) 8 - 45 U/L    BILIRUBIN TOTAL 0.6 0.3 - 1.3 mg/dL    PROTEIN TOTAL 6.9 6.0 - 8.0 g/dL    ESTIMATED GFR - FEMALE 48 (L) >=60 mL/min/BSA   LIPASE   Result Value Ref Range    LIPASE 40 10 - 60 U/L   CBC WITH DIFF   Result Value Ref Range    WBC 7.3 3.7 - 11.0 x10^3/uL    RBC 4.10 3.85 - 5.22 x10^6/uL    HGB 9.5 (L) 11.5 - 16.0 g/dL    HCT 32.0 (L) 34.8 - 46.0 %    MCV 78.0 78.0 - 100.0 fL    MCH 23.2 (L) 26.0 - 32.0 pg    MCHC 29.7 (L) 31.0 - 35.5 g/dL    RDW-CV 17.5 (H) 11.5 - 15.5 %    PLATELETS 140 (L) 150 - 400 x10^3/uL    MPV 11.2 8.7 - 12.5 fL    NEUTROPHIL % 79 %    LYMPHOCYTE % 15 %    MONOCYTE % 6 %    EOSINOPHIL % 0 %    BASOPHIL % 0 %    NEUTROPHIL # 5.72 1.50 - 7.70 x10^3/uL    LYMPHOCYTE # 1.05 1.00 - 4.80 x10^3/uL    MONOCYTE # 0.41 0.20 - 1.10 x10^3/uL    EOSINOPHIL # <0.10 <=0.50 x10^3/uL    BASOPHIL # <0.10 <=0.20 x10^3/uL    IMMATURE GRANULOCYTE % 0 0 - 1 %    IMMATURE GRANULOCYTE # <0.10 <0.10 x10^3/uL   BLUE TOP TUBE   Result Value Ref Range    RAINBOW/EXTRA TUBE AUTO RESULT  Yes    GRAY TOP TUBE   Result Value Ref Range  RAINBOW/EXTRA TUBE AUTO RESULT Yes          ED Course:   ED Course as of 03/29/22 0335   Thu Mar 29, 2022   0038 CBC/DIFF(!)  Normal white blood cell count.  Mild anemia noted.   0050 COMPREHENSIVE METABOLIC PANEL, NON-FASTING(!)  Elevated BUN  over baseline.  Improved creatinine from 4 months ago   0050 LIPASE  Normal lipase   0136 CT BRAIN WO IV CONTRAST  No acute intracranial process.   0148 CT ABDOMEN PELVIS W IV CONTRAST  1. Mild diffuse gastric wall thickening, correlate for gastritis.     2. Subacute appearing healing fractures of left anterolateral fifth, sixth, and seventh ribs.     3. Cirrhotic liver morphology with splenomegaly.     4. Increase in size of distal abdominal aortic aneurysm measuring 4.7 x 5 cm.        Medications Administered in the ED   pantoprazole (PROTONIX) 80 mg in NS 100 mL (tot vol) infusion (has no administration in time range)   NS premix infusion (has no administration in time range)   NS bolus infusion 1,000 mL (0 mL Intravenous Stopped 03/29/22 0207)   diphtheria, pertussis-acell, tetanus (BOOSTRIX) IM injection (0.5 mL IntraMUSCULAR Given 03/29/22 0108)   iopamidol (ISOVUE-300) 61% infusion (100 mL Intravenous Given 03/29/22 0127)   pantoprazole (PROTONIX) 40 mg in NS 100 mL IVPB (40 mg Intravenous New Bag/New Syringe 03/29/22 0200)   LORazepam (ATIVAN) 2 mg/mL injection (has no administration in time range)      Medical Decision Making  Differential diagnosis includes intercerebral hemorrhage, peptic ulcer disease, GI bleed, Mallory-Weiss tear, anemia.  Patient with findings of decreased hemoglobin and elevated BUN over baseline.  PPI started.  Will admit the hospitalist service for further evaluation and management.    Problems Addressed:  Accidental fall, initial encounter: self-limited or minor problem  Acute superficial gastritis with hemorrhage: acute illness or injury that poses a threat to life or bodily  functions  Noninfected skin tear of left lower extremity, initial encounter: acute illness or injury    Amount and/or Complexity of Data Reviewed  Labs: ordered. Decision-making details documented in ED Course.  Radiology: ordered. Decision-making details documented in ED Course.  ECG/medicine tests: independent interpretation performed.    Risk  Prescription drug management.  Parenteral controlled substances.  Decision regarding hospitalization.         Findings and diagnosis discussed with patient.  Clinical Impression:   Clinical Impression   Acute superficial gastritis with hemorrhage (Primary)   Accidental fall, initial encounter   Noninfected skin tear of left lower extremity, initial encounter         Disposition: Admitted          No follow-ups on file.   Current Discharge Medication List         No follow-up provider specified.    Future Appointments  No future appointments.     BP (!) 151/78   Pulse 80   Temp 36.1 C (97 F)   Resp 18   Ht 1.727 m (5\' 8" )   Wt 72.6 kg (160 lb)   SpO2 94%   BMI 24.33 kg/m        Sharma Covert, MD10/19/2023              This note was partially created using voice recognition software and is inherently subject to errors including those of syntax and "sound alike " substitutions which may escape proof reading.  In such instances, original meaning may be extrapolated by contextual derivation.

## 2022-03-29 NOTE — Care Management Notes (Signed)
03/29/22 1415   Assessment Details   Assessment Type Admission   Date of Care Management Update 03/29/22   Readmission   Is this a readmission? No   Insurance Information/Type   Insurance type Medicare   Medicare Intent to Discharge Documentation   Admit IMM given to: Patient Representative   Admit IMM letter given date 03/29/22   Admit IMM letter time given 1418   Patient Representative Tricia Bailey   Admit IMM Pt Representative/Phone # [phone]   IMM explained/reviewed with:  Patient Representative   Employment/Financial   Patient has Prescription Coverage?  Yes   Financial Concerns none   Living Environment   Select an age group to open "lives with" row.  Adult   Lives With other relative(s) (specify)  Tricia Bailey, patient's aunt lives with her)   [place] to Return to Prior Arrangements yes   [place] Accessibility no concerns;stairs to enter home;bed and bath on same level   Custody and Legal Status   Do you have a court appointed guardian/conservator? No   Are you an emancipated minor? No   Custody Issues? No   Paternity Affidavit Requested? No   Care Management Plan   Discharge Planning Status initial meeting   Projected Discharge Date 03/30/22   Discharge plan discussed with: Patient;MPOA   CM will evaluate for rehabilitation potential yes   Discharge Needs Assessment   Outpatient/Agency/Support Group Needs homecare agency   Equipment Currently Used at Home walker, front wheeled;cane, straight;nebulizer   Equipment Needed After Discharge none   Discharge Facility/Level of Care Needs Home vs Home with Home Health;Home (Patient/Family Member/other)(code 1);Undetermined at this time   Transportation Available family or friend will provide   Referral Information   Admission Type inpatient   Address Verified verified-no changes   Arrived From home or self-care   ADVANCE DIRECTIVES   Does the Patient have an Advance Directive? Yes, Patient Does Have Advance Directive for  Healthcare Treatment   Type of Advance Directive Completed Medical Power of Attorney   Copy of Advance Directives in Chart? No, Copy Requested From Family   Name of [place] or Healthcare Surrogate [place]   Phone Number of [place] or Healthcare Surrogate [phone]   Patient Requests Assistance in Having Advance Directive Notarized. N/A   Mutuality/Individual Preferences    Patient-Specific Goals (Include Timeframe) To return home as soon as possible   Anxieties, Fears or Concerns Denies   Home Main Entrance   Landing, Stairs, Main Entrance railings present   Surface of Stairs, Main Entrance hardwood   Number of Stairs, Main Entrance six     Patient's MPOA/Tricia Bailey would like home maker assistance if available.

## 2022-03-30 LAB — CBC WITH DIFF
BASOPHIL #: <0.1 10*3/uL (ref <=0.20)
BASOPHIL %: 0 %
EOSINOPHIL #: <0.1 10*3/uL (ref <=0.50)
EOSINOPHIL %: 2 %
HCT: 28 % — ABNORMAL LOW (ref 34.8–46.0)
HGB: 8.6 g/dL — ABNORMAL LOW (ref 11.5–16.0)
IMMATURE GRANULOCYTE #: <0.1 10*3/uL (ref <0.10)
IMMATURE GRANULOCYTE %: 0 % (ref 0–1)
LYMPHOCYTE #: 1.57 10*3/uL (ref 1.00–4.80)
LYMPHOCYTE %: 34 %
MCH: 23.8 pg — ABNORMAL LOW (ref 26.0–32.0)
MCHC: 30.7 g/dL — ABNORMAL LOW (ref 31.0–35.5)
MCV: 77.3 fL — ABNORMAL LOW (ref 78.0–100.0)
MONOCYTE #: 0.41 10*3/uL (ref 0.20–1.10)
MONOCYTE %: 9 %
NEUTROPHIL #: 2.54 10*3/uL (ref 1.50–7.70)
NEUTROPHIL %: 55 %
PLATELETS: 99 10*3/uL — ABNORMAL LOW (ref 150–400)
RBC: 3.62 10*6/uL — ABNORMAL LOW (ref 3.85–5.22)
RDW-CV: 18 % — ABNORMAL HIGH (ref 11.5–15.5)
WBC: 4.6 10*3/uL (ref 3.7–11.0)

## 2022-03-30 LAB — COMPREHENSIVE METABOLIC PNL, FASTING
ALBUMIN: 3 g/dL — ABNORMAL LOW (ref 3.4–4.8)
ALKALINE PHOSPHATASE: 86 U/L (ref 55–145)
ALT (SGPT): 22 U/L (ref 8–22)
ANION GAP: 7 mmol/L (ref 4–13)
AST (SGOT): 33 U/L (ref 8–45)
BILIRUBIN TOTAL: 0.7 mg/dL (ref 0.3–1.3)
BUN/CREA RATIO: 17 (ref 6–22)
BUN: 17 mg/dL (ref 8–25)
CALCIUM: 8.7 mg/dL (ref 8.6–10.3)
CHLORIDE: 110 mmol/L (ref 96–111)
CO2 TOTAL: 27 mmol/L (ref 23–31)
CREATININE: 1.02 mg/dL (ref 0.60–1.05)
ESTIMATED GFR - FEMALE: 51 mL/min/BSA — ABNORMAL LOW (ref >=60)
GLUCOSE: 93 mg/dL (ref 70–99)
POTASSIUM: 3.9 mmol/L (ref 3.5–5.1)
PROTEIN TOTAL: 6 g/dL (ref 6.0–8.0)
SODIUM: 144 mmol/L (ref 136–145)

## 2022-03-30 MED ORDER — SODIUM FERRIC GLUCONATE COMPLEX IN SUCROSE 62.5 MG/5 ML INTRAVENOUS
125.0000 mg | Freq: Once | INTRAVENOUS | Status: AC
Start: 2022-03-30 — End: 2022-03-30
  Administered 2022-03-30: 0 mg via INTRAVENOUS
  Administered 2022-03-30: 125 mg via INTRAVENOUS
  Filled 2022-03-30: qty 10

## 2022-03-30 NOTE — Progress Notes (Signed)
[facility]  Hospitalist  Progress Note    Date of Service:  03/30/2022  Tricia Bailey, Tricia Bailey, 88 y.o. female  Date of Admission:  03/29/2022  Date of Birth:  02/15/1925  PCP: Tera Partridge, DO (Inactive)    HPI:  Patient with upper GI bleed with acute blood loss anemia.  Patient's hemoglobin is stable at 8.6 today.  She has not had any more coffee-ground emesis or evidence of active bleeding she is on PPI and Carafate.  Surgery is following and recommend conservative management.    amLODIPine (NORVASC) tablet, 2.5 mg, Oral, 2x/day  clonazePAM (klonoPIN) tablet, 0.5 mg, Oral, Q12H PRN  ferric gluconate (FERRLECIT) 125 mg in NS 100 mL IVPB, 125 mg, Intravenous, Once  HYDROcodone-acetaminophen (NORCO) 5-325 mg per tablet, 1 Tablet, Oral, Q6H PRN  levothyroxine (SYNTHROID) tablet, 25 mcg, Oral, QAM  magnesium hydroxide (MILK OF MAGNESIA) 400mg  per 78mL oral liquid, 15 mL, Oral, Daily PRN  nebivolol (BYSTOLIC) tablet, 10 mg, Oral, 2x/day  NS flush syringe, 10 mL, Intracatheter, Q8HRS  NS flush syringe, 10 mL, Intracatheter, Q1H PRN  NS premix infusion, , Intravenous, Continuous  ondansetron (ZOFRAN) 2 mg/mL injection, 4 mg, Intravenous, Q6H PRN  pantoprazole (PROTONIX) 80 mg in NS 100 mL (tot vol) infusion, , Intravenous, Continuous  pantoprazole (PROTONIX) delayed release tablet, 40 mg, Oral, Daily  promethazine (PHENERGAN) tablet, 25 mg, Oral, Q6H PRN  sucralfate (CARAFATE) tablet, 1 g, Oral, Q6HRS         Allergies   Allergen Reactions    Cephalexin  Other Adverse Reaction (Add comment)     unknown    Hctz [Hydrochlorothiazide]  Other Adverse Reaction (Add comment)     unknown    Macrodantin [Nitrofurantoin]  Other Adverse Reaction (Add comment)     unknown    Minocin [Minocycline]  Other Adverse Reaction (Add comment)     unknown               Filed Vitals:    03/29/22 2346 03/30/22 0405 03/30/22 0730 03/30/22 1200   BP: (!) 165/69 (!) 153/71 (!) 160/80 135/77   Pulse: 74 69 72 78   Resp: 18  18 16 18    Temp: 36.6 C (97.9 F) 36.7 C (98 F) 36.4 C (97.6 F) 36.4 C (97.6 F)   SpO2:           Physical Exam  Constitutional:       Comments: Female patient sitting upright in her room walking around she does not appear to be in acute distress   HENT:      Head: Normocephalic and atraumatic.      Right Ear: External ear normal.      Left Ear: External ear normal.      Nose: Nose normal.   Eyes:      Pupils: Pupils are equal, round, and reactive to light.   Cardiovascular:      Rate and Rhythm: Normal rate and regular rhythm.      Heart sounds: Normal heart sounds.   Pulmonary:      Breath sounds: Normal breath sounds.   Abdominal:      General: Bowel sounds are normal. There is no distension.      Palpations: Abdomen is soft.      Tenderness: There is no abdominal tenderness.   Musculoskeletal:         General: No tenderness or deformity. Normal range of motion.      Cervical back: Normal range  of motion and neck supple.   Skin:     General: Skin is warm and dry.      Findings: No rash.   Neurological:      Mental Status: She is alert and oriented to person, place, and time.      Cranial Nerves: No cranial nerve deficit.      Deep Tendon Reflexes: Reflexes are normal and symmetric.   Psychiatric:         Mood and Affect: Affect normal.         Cognition and Memory: Memory normal.         Judgment: Judgment normal.          Laboratory Data:     Results for orders placed or performed during the hospital encounter of 03/29/22 (from the past 24 hour(s))   COMPREHENSIVE METABOLIC PNL, FASTING   Result Value Ref Range    SODIUM 144 136 - 145 mmol/L    POTASSIUM 3.9 3.5 - 5.1 mmol/L    CHLORIDE 110 96 - 111 mmol/L    CO2 TOTAL 27 23 - 31 mmol/L    ANION GAP 7 4 - 13 mmol/L    BUN 17 8 - 25 mg/dL    CREATININE 1.02 0.60 - 1.05 mg/dL    BUN/CREA RATIO 17 6 - 22    ALBUMIN 3.0 (L) 3.4 - 4.8 g/dL     CALCIUM 8.7 8.6 - 10.3 mg/dL    GLUCOSE 93 70 - 99 mg/dL    ALKALINE PHOSPHATASE 86 55 - 145 U/L    ALT (SGPT) 22 8 -  22 U/L    AST (SGOT)  33 8 - 45 U/L    BILIRUBIN TOTAL 0.7 0.3 - 1.3 mg/dL    PROTEIN TOTAL 6.0 6.0 - 8.0 g/dL    ESTIMATED GFR - FEMALE 51 (L) >=60 mL/min/BSA   CBC WITH DIFF   Result Value Ref Range    WBC 4.6 3.7 - 11.0 x10^3/uL    RBC 3.62 (L) 3.85 - 5.22 x10^6/uL    HGB 8.6 (L) 11.5 - 16.0 g/dL    HCT 28.0 (L) 34.8 - 46.0 %    MCV 77.3 (L) 78.0 - 100.0 fL    MCH 23.8 (L) 26.0 - 32.0 pg    MCHC 30.7 (L) 31.0 - 35.5 g/dL    RDW-CV 18.0 (H) 11.5 - 15.5 %    PLATELETS 99 (L) 150 - 400 x10^3/uL    NEUTROPHIL % 55 %    LYMPHOCYTE % 34 %    MONOCYTE % 9 %    EOSINOPHIL % 2 %    BASOPHIL % 0 %    NEUTROPHIL # 2.54 1.50 - 7.70 x10^3/uL    LYMPHOCYTE # 1.57 1.00 - 4.80 x10^3/uL    MONOCYTE # 0.41 0.20 - 1.10 x10^3/uL    EOSINOPHIL # <0.10 <=0.50 x10^3/uL    BASOPHIL # <0.10 <=0.20 x10^3/uL    IMMATURE GRANULOCYTE % 0 0 - 1 %    IMMATURE GRANULOCYTE # <0.10 <0.10 x10^3/uL       Imaging Studies:    CT ABDOMEN PELVIS W IV CONTRAST   Final Result by Edi, Radresults In (10/19 0147)         1. Mild diffuse gastric wall thickening, correlate for gastritis.      2. Subacute appearing healing fractures of left anterolateral fifth, sixth, and seventh ribs.      3. Cirrhotic liver morphology with splenomegaly.      4.  Increase in size of distal abdominal aortic aneurysm measuring 4.7 x 5 cm.                  The CT exam was performed using one or more of the following dose reduction techniques: Automated exposure control, adjustment of the mA and/or kV according to the patient's size, or use of iterative reconstruction technique.         Radiologist location ID: [id number]         CT BRAIN WO IV CONTRAST   Final Result by Edi, Radresults In (10/19 0128)   No acute intracranial process.               The CT exam was performed using one or more of the following dose reduction techniques: Automated exposure control, adjustment of the mA and/or kV according to the patient's size, or use of iterative reconstruction technique.                Radiologist location ID: [id number]             Assessment/Plan:  Hospital Problems    1Acute upper GI bleed         Date Noted: 03/29/2022      Vertigo         Date Noted: 11/08/2021      GERD (gastroesophageal reflux disease)      HTN (hypertension)      Resolved Hospital Problems  No resolved problems to display.      1. Acute upper GI bleed with acute blood loss anemia will continue PPI and Carafate.  We will advance diet as tolerated.  Will follow hemoglobin hematocrit.    2.  Hypertension continue amlodipine and Bystolic  3. Hypothyroidism continue levothyroxine    I spent a total of (20) minutes in direct/indirect care of this patient including initial evaluation, review of laboratory, radiology, diagnostic studies, review of medical record, order entry and coordination of care.     Birdie Hopes, PA-C    I personally saw and evaluated the patient as part of a shared service with an APP.    My substantive findings are:  SUBSTANTIVE FINDINGS: MDM (complete) patient is admitted with acute upper GI bleed with acute blood loss anemia.  Continue PPI and Carafate.  Advance diet as tolerated.  Patient does want to start eating more today.  Continue to check her hemoglobin.  If hemoglobin stable patient can likely discharge home tomorrow.  Will give dose of Ferrlecit today.    I independently of the APP spent a total of (20) minutes in direct/indirect care of this patient including initial evaluation, review of laboratory, radiology, diagnostic studies, review of medical record, order entry and coordination of care.

## 2022-03-30 NOTE — Care Plan (Signed)
Problem: Adult Inpatient Plan of Care  Goal: Absence of Hospital-Acquired Illness or Injury  Outcome: Ongoing (see interventions/notes)  Intervention: Identify and Manage Fall Risk  Recent Flowsheet Documentation  Taken 03/29/2022 2138 by Kathlen Brunswick, LPN  Safety Promotion/Fall Prevention:   activity supervised   fall prevention program maintained   nonskid shoes/slippers when out of bed   safety round/check completed  Intervention: Prevent Skin Injury  Recent Flowsheet Documentation  Taken 03/29/2022 2138 by Kathlen Brunswick, LPN  Skin Protection:   adhesive use limited   transparent dressing maintained   tubing/devices free from skin contact  Intervention: Prevent Infection  Recent Flowsheet Documentation  Taken 03/29/2022 2138 by Kathlen Brunswick, LPN  Infection Prevention:   promote handwashing   rest/sleep promoted     Problem: Pain Acute  Goal: Optimal Pain Control and Function  Outcome: Ongoing (see interventions/notes)  Intervention: Optimize Psychosocial Wellbeing  Recent Flowsheet Documentation  Taken 03/29/2022 2138 by Kathlen Brunswick, LPN  Diversional Activities: television     Problem: Gastrointestinal Bleeding  Goal: Optimal Coping with Acute Illness  Outcome: Ongoing (see interventions/notes)       Patient calm and cooperative throughout shift. Patient complained of difficulty falling asleep and was ordered clonazepam 0.5mg , and proved to be successful. Patient able to rest comfortably between care. Patient up, bed alarm sounding once throughout shift; ambulated to rest room without difficulty. No c/o pain or discomfort, denies needing anything at this time. Call light and bedside table within reach.

## 2022-03-30 NOTE — Care Plan (Signed)
[facility]  Occupational Therapy Initial Evaluation    Patient Name: Tricia Bailey  Date of Birth: May 07, 1925  Height: Height: 172.7 cm (_0 )  Weight: Weight: 71.1 kg (156 lb 11.2 oz)  Room/Bed: 1137/B  Payor: [employer]-ADV / Plan: [employer] MEDICARE ADVANTAGE PPO / Product Type: PPO /     Assessment:      Pt very HOH. She is requiring more assist with ADL's and functional activity. Family that is presents stated she mowed the law up until recently. Pt performed functional transfer with min A of 2 and scissor stepped/required cues secondary to impulsive and thought she was independent. She will continue to benefit from skilled OT while in the hospital.     Discharge Disposition: (P) home with 24/7 assistance, to be determined    JUSTIFICATION OF DISCHARGE RECOMMENDATION   Based on current diagnosis, functional performance prior to admission, and current functional performance, this patient requires continued OT services in (P) home with 24/7 assistance, to be determined  in order to achieve significant functional improvements.    Plan:   Current Intervention: (P) ADL retraining, therapeutic exercise, transfer training, cognitive retraining    To provide Occupational therapy services (P) minimum of 5x/week, (P) until discharge. Left written POC for treating COTAs.       The risks/benefits of therapy have been discussed with the patient/caregiver and he/she is in agreement with the established plan of care.       Subjective & Objective     Pt admitted with upper GI bleed.       03/30/22 0750   Rehab Session   Document Type evaluation   Patient Effort good   General Information   Patient Profile Reviewed yes   Medical Lines Telemetry;PIV Line   Respiratory Status room air   Existing Precautions/Restrictions fall precautions;hard of hearing   Pre Treatment Status   Pre Treatment Patient Status Patient sitting on edge of bed   Support Present Pre Treatment  Family  present  (rehab tech present with OTR)   Living Environment   Lives With   (family lives with her)   Living Arrangements house   Functional Level Prior   Ambulation 0 - independent   Transferring 0 - independent   Toileting 0 - independent   Bathing 0 - independent   Dressing 0 - independent   Eating 0 - independent   Prior Functional Level Comment Pt's family states she does everything independently.   Pain Assessment   Additional Documentation   (no reports of pain during OT eval)   Coping/Psychosocial Response Interventions   Plan Of Care Reviewed With patient;family   Cognitive Assessment/Interventions   Behavior/Mood Observations alert;cooperative;impulsive   Orientation Status oriented to;person;place  (very HOH)   Attention difficulty attending to task/directions;difficult dividing attention;needs cues to re-direct   Follows Commands follows one step commands;physical/tactile prompts required   RUE Assessment   RUE Assessment WFL for age   LUE Assessment   LUE Assessment WFL for Age   Transfer Assessment/Treatment   Transfer Comment min A hand held, however pt scissor stepped and had LOB   Bathing Assessment/Training   Independence Level moderate assist (50% patient effort)   Upper Body Dressing Assessment/Training   Independence Level  minimum assist (75% patient effort)   Lower Body Dressing Assessment/Training   Independence Level  moderate assist (50% patient effort)   Toileting Assessment/Training   Independence Level  minimum assist (75% patient effort)   Grooming/Oral  Hygeine  Assessment/Training   Independence Level minimum assist (75% patient effort)   Self-Feeding Assessment/Training   Independence Level minimum assist (75% patient effort)   Balance Skill Training   Standing Balance: Dynamic poor + balance   Post Treatment Status   Post Treatment Patient Status Patient supine in bed;Call light within reach   Support Present Post Treatment  Family present   Planned Therapy Interventions, OT Eval    Planned Therapy Interventions ADL retraining;therapeutic exercise;transfer training;cognitive retraining   Functional Impairment   Overall Functional Impairments/Problem List balance impaired;cognition impaired   Clinical Impression   OT Diagnosis decline in ADL   Patient/Family Goals Statement return home   Criteria for Skilled Therapeutic Interventions Met (OT) yes;meets criteria;skilled treatment is necessary   Rehab Potential good   Therapy Frequency minimum of 5x/week   Predicted Duration of Therapy until discharge   Anticipated Discharge Disposition home with 24/7 assistance;to be determined   Evaluation Complexity Justification   Occupational Profile Review Brief history   Performance Deficits 1-3 deficits   Clinical Decision Making Low analytic complexity   Evaluation Complexity Low     GOALS:  In 1 week, pt will:  Perform toilet xfer with S to increase I with ADL  Complete UB/LB dressing with S to increase I with ADL    Therapist:   Eloy End, OTR/L #[id number]      Maxcine Ham, MD  03/30/2022, 16:11

## 2022-03-30 NOTE — PT Evaluation (Signed)
[facility]  Physical Therapy Initial Evaluation    Patient Name: Tricia Bailey  Date of Birth: 10-14-1924  Height: Height: 172.7 cm (5\' 8" )  Weight: Weight: 71.1 kg (156 lb 11.2 oz)  Room/Bed: 1137/B  Payor: [employer]I-ADV / Plan: [employer] MEDICARE ADVANTAGE PPO / Product Type: PPO /     Assessment:     Patient demonstrated very minimal transfers and gait impairment. She only required SBA x 1 person assist on most of her physical tasks. She tolerated up to 440 FT gait distance exhibiting good balance both in static and dynamic postures. She is able to ambulate without any use of an AD.  She can also traverse steps/stair without any significant difficulty. There is no significant weakness noted in her BLE's strength.We will continue to work with patient while she is in acute care to promote and maximize her overall functional mobility and prevent any onset of physical deconditioning.    Plan:   Current Intervention: bed mobility training, gait training, transfer training  To provide physical therapy services minimum of 5x/week  for duration of until discharge.    The risks/benefits of therapy have been discussed with the patient/caregiver and he/she is in agreement with the established plan of care.       Subjective & Objective   Patient is admitted in acute care for upper GI Bleed.  PT Evaluation is completed. PT treatment was initiated with gait training.     03/30/22 1000   Rehab Session   Document Type evaluation   Patient Effort good   General Information   Patient Profile Reviewed yes   Existing Precautions/Restrictions hard of hearing   Mutuality/Individual Preferences   Anxieties, Fears or Concerns none   Living Environment   Lives With other (see comments)  (relative)   Functional Level Prior   Ambulation 0 - independent   Transferring 0 - independent   Pre Treatment Status   Pre Treatment Patient Status Patient sitting in bedside chair or w/c   Support  Present Pre Treatment  Family present   Cognitive Assessment/Interventions   Behavior/Mood Observations alert;cooperative   Orientation Status oriented x 3   Follows Commands follows one step commands   Vital Signs   Pre-Treatment Heart Rate (beats/min) 80   Pre SpO2 (%) 93   O2 Delivery Pre Treatment room air   Post SpO2 (%) 94   O2 Delivery Post Treatment room air   Pain Assessment   Pre/Posttreatment Pain Comment denies any pain   RLE Assessment   RLE Assessment WFL for stated baseline   LLE Assessment   LLE Assessment WFL for stated baseline   Transfer Assessment/Treatment   Stand-Sit Independence stand-by assistance   Sit-Stand-Sit, Assist Device None   Gait Assessment/Treatment   Total Distance Ambulated 440   Independence  stand-by assistance   Assistive Device  other (see comments)  (FWW/none)   Distance in Feet 220 FT x 2   Gait Speed steady cadence   Balance Skill Training   Sitting Balance: Static good balance   Sitting, Dynamic (Balance) good balance   Sit-to-Stand Balance good balance   Standing Balance: Static good balance   Therapeutic Exercise/Activity   Comment stair training   Post Treatment Status   Post Treatment Patient Status Patient sitting in bedside chair or w/c   Support Present Post Treatment  Family present   Plan of Care Review   Plan Of Care Reviewed With patient;family   Physical Therapy Clinical  Impression   Impairments Found (describe specific impairments) gait, locomotion, and balance   Therapy Frequency minimum of 5x/week   Predicted Duration of Therapy Intervention (days/wks) until discharge   Anticipated Discharge Disposition home   Care Plan Goals   PT Rehab Goals Bed Mobility Goal;Gait Training Goal;Transfer Training Goal   Bed Mobility Goal   Bed Mobility Goal, Date Established 03/30/22   Bed Mobility Goal, Time to Achieve by discharge   Bed Mobility Goal, Activity Type roll left/roll right;supine to sit/sit to supine;sidelying to sit/sit to sidelying   Bed Mobility Goal,  Independence Level modified independence   Gait Training  Goal, Distance to Achieve   Gait Training  Goal, Date Established 03/30/22   Gait Training  Goal, Time to Achieve by discharge   Gait Training  Goal, Independence Level modified independence;independent   Gait Training  Goal, Assist Device least restricted assistive device   Transfer Training Goal   Transfer Training Goal, Date Established 03/30/22   Transfer Training Goal, Time to Achieve by discharge   Transfer Training Goal, Activity Type bed-to-chair/chair-to-bed;sit-to-stand/stand-to-sit   Transfer Training Goal, Independence Level modified independence;independent   Planned Therapy Interventions, PT Eval   Planned Therapy Interventions (PT) bed mobility training;gait training;transfer training         Therapist:   Jeanella Flattery, PT, DPT       Kellie Shropshire, MD  03/30/2022, 16:25

## 2022-03-30 NOTE — Care Plan (Signed)
Problem: Health Knowledge, Opportunity to Enhance (Adult,Obstetrics,Pediatric)  Goal: Knowledgeable about Health Subject/Topic  Description: Patient will demonstrate the desired outcomes by discharge/transition of care.  Outcome: Ongoing (see interventions/notes)  Intervention: Enhance Health Knowledge  Recent Flowsheet Documentation  Taken 03/30/2022 1030 by Cecilio Asper, RN  Supportive Measures:   active listening utilized   counseling provided   decision-making supported   goal-setting facilitated   verbalization of feelings encouraged   self-responsibility promoted   self-reflection promoted   self-care encouraged   relaxation techniques promoted   problem-solving facilitated     Problem: Adult Inpatient Plan of Care  Goal: Plan of Care Review  Outcome: Ongoing (see interventions/notes)  Goal: Patient-Specific Goal (Individualized)  Outcome: Ongoing (see interventions/notes)  Goal: Absence of Hospital-Acquired Illness or Injury  Outcome: Ongoing (see interventions/notes)  Intervention: Identify and Manage Fall Risk  Recent Flowsheet Documentation  Taken 03/30/2022 1030 by Cecilio Asper, RN  Safety Promotion/Fall Prevention:   activity supervised   fall prevention program maintained   muscle strengthening facilitated   nonskid shoes/slippers when out of bed   safety round/check completed  Intervention: Prevent Skin Injury  Recent Flowsheet Documentation  Taken 03/30/2022 1030 by Cecilio Asper, RN  Skin Protection:   adhesive use limited   incontinence pads utilized  Intervention: Prevent and Manage VTE (Venous Thromboembolism) Risk  Recent Flowsheet Documentation  Taken 03/30/2022 1030 by Cecilio Asper, RN  VTE Prevention/Management:   ambulation promoted   anticoagulant therapy maintained  Intervention: Prevent Infection  Recent Flowsheet Documentation  Taken 03/30/2022 1030 by Cecilio Asper, RN  Infection Prevention:   single patient room provided   visitors restricted/screened   rest/sleep  promoted   promote handwashing  Goal: Optimal Comfort and Wellbeing  Outcome: Ongoing (see interventions/notes)  Intervention: Provide Person-Centered Care  Recent Flowsheet Documentation  Taken 03/30/2022 1030 by Cecilio Asper, RN  Trust Relationship/Rapport:   care explained   choices provided   emotional support provided   questions encouraged   reassurance provided   thoughts/feelings acknowledged   questions answered   empathic listening provided  Goal: Rounds/Family Conference  Outcome: Ongoing (see interventions/notes)     Problem: Skin Injury Risk Increased  Goal: Skin Health and Integrity  Outcome: Ongoing (see interventions/notes)  Intervention: Optimize Skin Protection  Recent Flowsheet Documentation  Taken 03/30/2022 1030 by Cecilio Asper, RN  Skin Protection:   adhesive use limited   incontinence pads utilized     Problem: Pain Acute  Goal: Optimal Pain Control and Function  Outcome: Ongoing (see interventions/notes)  Intervention: Prevent or Manage Pain  Recent Flowsheet Documentation  Taken 03/30/2022 1030 by Cecilio Asper, RN  Sleep/Rest Enhancement:   room darkened   relaxation techniques promoted   regular sleep/rest pattern promoted  Intervention: Optimize Psychosocial Wellbeing  Recent Flowsheet Documentation  Taken 03/30/2022 1030 by Cecilio Asper, RN  Diversional Activities: television  Supportive Measures:   active listening utilized   counseling provided   decision-making supported   goal-setting facilitated   verbalization of feelings encouraged   self-responsibility promoted   self-reflection promoted   self-care encouraged   relaxation techniques promoted   problem-solving facilitated     Problem: Gastrointestinal Bleeding  Goal: Optimal Coping with Acute Illness  Outcome: Ongoing (see interventions/notes)  Intervention: Optimize Psychosocial Response  Recent Flowsheet Documentation  Taken 03/30/2022 1030 by Cecilio Asper, RN  Supportive Measures:   active listening utilized    counseling provided   decision-making supported   goal-setting facilitated   verbalization of feelings encouraged  self-responsibility promoted   self-reflection promoted   self-care encouraged   relaxation techniques promoted   problem-solving facilitated  Goal: Hemostasis  Outcome: Ongoing (see interventions/notes)

## 2022-03-30 NOTE — Nurses Notes (Signed)
Patient had difficulty swallowing Carafate with this morning's pill pass. Patient had to take several drinks of water and light soda to clear throat. Patient sitting up in fowler's position, will continue to monitor.

## 2022-03-31 LAB — COMPREHENSIVE METABOLIC PANEL, NON-FASTING
ALBUMIN: 3 g/dL — ABNORMAL LOW (ref 3.4–4.8)
ALKALINE PHOSPHATASE: 88 U/L (ref 55–145)
ALT (SGPT): 22 U/L (ref 8–22)
ANION GAP: 9 mmol/L (ref 4–13)
AST (SGOT): 35 U/L (ref 8–45)
BILIRUBIN TOTAL: 0.5 mg/dL (ref 0.3–1.3)
BUN/CREA RATIO: 12 (ref 6–22)
BUN: 12 mg/dL (ref 8–25)
CALCIUM: 8.7 mg/dL (ref 8.6–10.3)
CHLORIDE: 111 mmol/L (ref 96–111)
CO2 TOTAL: 22 mmol/L — ABNORMAL LOW (ref 23–31)
CREATININE: 1 mg/dL (ref 0.60–1.05)
ESTIMATED GFR - FEMALE: 52 mL/min/BSA — ABNORMAL LOW (ref >=60)
GLUCOSE: 103 mg/dL (ref 65–125)
POTASSIUM: 3.8 mmol/L (ref 3.5–5.1)
PROTEIN TOTAL: 6.2 g/dL (ref 6.0–8.0)
SODIUM: 142 mmol/L (ref 136–145)

## 2022-03-31 LAB — CBC WITH DIFF
BASOPHIL #: <0.1 10*3/uL (ref <=0.20)
BASOPHIL %: 1 %
EOSINOPHIL #: 0.11 10*3/uL (ref <=0.50)
EOSINOPHIL %: 2 %
HCT: 26.9 % — ABNORMAL LOW (ref 34.8–46.0)
HGB: 8.2 g/dL — ABNORMAL LOW (ref 11.5–16.0)
IMMATURE GRANULOCYTE #: <0.1 10*3/uL (ref <0.10)
IMMATURE GRANULOCYTE %: 0 % (ref 0–1)
LYMPHOCYTE #: 1.49 10*3/uL (ref 1.00–4.80)
LYMPHOCYTE %: 30 %
MCH: 23.2 pg — ABNORMAL LOW (ref 26.0–32.0)
MCHC: 30.5 g/dL — ABNORMAL LOW (ref 31.0–35.5)
MCV: 76 fL — ABNORMAL LOW (ref 78.0–100.0)
MONOCYTE #: 0.49 10*3/uL (ref 0.20–1.10)
MONOCYTE %: 10 %
MPV: 11.2 fL (ref 8.7–12.5)
NEUTROPHIL #: 2.89 10*3/uL (ref 1.50–7.70)
NEUTROPHIL %: 57 %
PLATELETS: 122 10*3/uL — ABNORMAL LOW (ref 150–400)
RBC: 3.54 10*6/uL — ABNORMAL LOW (ref 3.85–5.22)
RDW-CV: 18.1 % — ABNORMAL HIGH (ref 11.5–15.5)
WBC: 5 10*3/uL (ref 3.7–11.0)

## 2022-03-31 MED ORDER — CLONAZEPAM 0.5 MG DISINTEGRATING TABLET
0.5000 mg | ORAL_TABLET | Freq: Two times a day (BID) | ORAL | 0 refills | Status: DC | PRN
Start: 2022-03-31 — End: 2023-08-28

## 2022-03-31 MED ORDER — SUCRALFATE 1 GRAM TABLET
1.0000 g | ORAL_TABLET | Freq: Four times a day (QID) | ORAL | 0 refills | Status: DC
Start: 2022-03-31 — End: 2022-06-13

## 2022-03-31 MED ORDER — SUCRALFATE 1 GRAM TABLET
1.0000 g | ORAL_TABLET | Freq: Four times a day (QID) | ORAL | 0 refills | Status: DC
Start: 2022-03-31 — End: 2022-03-31

## 2022-03-31 MED ORDER — PANTOPRAZOLE 40 MG TABLET,DELAYED RELEASE
40.0000 mg | DELAYED_RELEASE_TABLET | Freq: Every day | ORAL | 0 refills | Status: DC
Start: 2022-03-31 — End: 2022-03-31

## 2022-03-31 MED ORDER — PANTOPRAZOLE 40 MG TABLET,DELAYED RELEASE
40.0000 mg | DELAYED_RELEASE_TABLET | Freq: Every day | ORAL | 0 refills | Status: DC
Start: 2022-03-31 — End: 2023-08-28

## 2022-03-31 MED ORDER — FERROUS SULFATE 324 MG (65 MG IRON) TABLET,DELAYED RELEASE
324.0000 mg | DELAYED_RELEASE_TABLET | Freq: Every morning | ORAL | 0 refills | Status: DC
Start: 2022-03-31 — End: 2022-08-05

## 2022-03-31 NOTE — Discharge Summary (Signed)
[facility]  DISCHARGE SUMMARY      PATIENT NAME:  Tricia Bailey, Tricia Bailey  MRN:  [id number]  DOB:  1924-07-27    INPATIENT ADMISSION DATE: 03/29/2022  DISCHARGE DATE:  03/31/2022    ATTENDING PHYSICIAN: Kellie ShropshireWillis, , MD  SERVICE: [facility] HOSPITALIST  PRIMARY CARE PHYSICIAN: Naomie DeanJessica Bailey Murphy, DO (Inactive)       DISCHARGE DIAGNOSIS:     Active Hospital Problems    Diagnosis Date Noted    Principal Problem: Acute upper GI bleed [K92.2] 03/29/2022    Vertigo [R42] 11/08/2021    GERD (gastroesophageal reflux disease) [K21.9]     HTN (hypertension) [I10]       Resolved Hospital Problems   No resolved problems to display.             REASON FOR HOSPITALIZATION AND HOSPITAL COURSE:    This is a 88 y.o., female who is admitted for upper GI bleed.  The patient presented to the emergency department after having fallen at home.  She reports that she is had coffee-ground emesis and black colored stool over the last couple of days.  Family brought her in because they were concerned about internal bleeding.  Patient was anemic with a hemoglobin 9.5.  Patient was admitted to the hospital for further workup of her upper GI bleed    While here her hemoglobin did drop to as low as 8.2 however the patient remained hemodynamically stable.  She was seen by the surgeon who recommended conservative management since the patient was hemodynamically stable and did not have a significant drop in her hemoglobin.  She did not undergo endoscopy.  We started the patient on Protonix and Carafate as well as Ferrlecit.  The patient has remained stable her BUN and creatinine are normal the patient feels well.  I feel that she can be discharged home today.  She will be on Protonix and Carafate at home.  She will also be on ferrous sulfate for the next month.  Patient seemed to rest better with Klonopin 0.5 mg every 12 hours versus her 0.125 mg q.6 so I did change her Klonopin dosage as well.             DISCHARGE MEDICATIONS:      Current Discharge Medication List        START taking these medications.        Details   ferrous sulfate 324 mg (65 mg iron) Tablet, Delayed Release (E.C.)  Commonly known as: FERATAB   324 mg, Oral, EVERY MORNING BEFORE BREAKFAST  Qty: 30 Tablet  Refills: 0     pantoprazole 40 mg Tablet, Delayed Release (E.C.)  Commonly known as: PROTONIX   40 mg, Oral, DAILY  Qty: 30 Tablet  Refills: 0     sucralfate 1 gram Tablet  Commonly known as: CARAFATE   1 g, Oral, 4 TIMES DAILY - BEFORE MEALS AND NIGHTLY  Qty: 120 Tablet  Refills: 0            CONTINUE these medications which have CHANGED during your visit.        Details   clonazePAM 0.5 mg Tablet, Rapid Dissolve  Commonly known as: klonoPIN  What changed:   medication strength  how much to take  when to take this   0.5 mg, Oral, EVERY 12 HOURS PRN  Qty: 14 Tablet  Refills: 0            CONTINUE these medications - NO CHANGES were  made during your visit.        Details   albuterol sulfate 90 mcg/actuation oral inhaler  Commonly known as: PROVENTIL or VENTOLIN or PROAIR   1-2 Puffs, Inhalation, EVERY 6 HOURS PRN  Refills: 0     amLODIPine 2.5 mg Tablet  Commonly known as: NORVASC   2.5 mg, Oral, 2 TIMES DAILY  Refills: 0     cyclobenzaprine 10 mg Tablet  Commonly known as: FLEXERIL   10 mg, Oral, 3 TIMES DAILY  Refills: 0     HYDROcodone-acetaminophen 5-325 mg Tablet  Commonly known as: NORCO   1 Tablet, Oral, EVERY 6 HOURS PRN  Qty: 12 Tablet  Refills: 0     levothyroxine 25 mcg Tablet  Commonly known as: SYNTHROID   25 mcg, Oral, EVERY MORNING, Unsure of dose  Refills: 0     nebivoloL 10 mg Tablet  Commonly known as: BYSTOLIC   10 mg, Oral, 2 TIMES DAILY  Refills: 0     promethazine 25 mg Tablet  Commonly known as: PHENERGAN   25 mg, Oral, EVERY 6 HOURS PRN  Refills: 0            STOP taking these medications.      omeprazole 20 mg Capsule, Delayed Release(E.C.)  Commonly known as: PRILOSEC              Filed Vitals:    03/30/22 2305 03/31/22 0444 03/31/22 0620  03/31/22 0720   BP: (!) 149/67 (!) 136/58  (!) 124/53   Pulse: 84 78  74   Resp: 20 18 16 20    Temp: 36.7 C (98.1 F) 36.8 C (98.3 F)  36.5 C (97.7 F)   SpO2:          Physical Exam  Constitutional:       Comments: Hard of hearing, pleasant female sitting upright in bed.  Does not appear to be in acute distress   HENT:      Head: Normocephalic and atraumatic.      Right Ear: External ear normal.      Left Ear: External ear normal.      Nose: Nose normal.   Eyes:      Pupils: Pupils are equal, round, and reactive to light.   Cardiovascular:      Rate and Rhythm: Normal rate and regular rhythm.      Heart sounds: Murmur heard.   Pulmonary:      Breath sounds: Normal breath sounds.   Abdominal:      General: Bowel sounds are normal. There is no distension.      Palpations: Abdomen is soft.      Tenderness: There is no abdominal tenderness.   Musculoskeletal:         General: No tenderness or deformity. Normal range of motion.      Cervical back: Normal range of motion and neck supple.   Skin:     General: Skin is warm and dry.      Findings: No rash.   Neurological:      Mental Status: She is alert. Mental status is at baseline.      Cranial Nerves: No cranial nerve deficit.      Deep Tendon Reflexes: Reflexes are normal and symmetric.   Psychiatric:         Mood and Affect: Affect normal.         Cognition and Memory: Memory normal.         Judgment: Judgment  normal.             Laboratory Data:     Results for orders placed or performed during the hospital encounter of 03/29/22 (from the past 24 hour(s))   COMPREHENSIVE METABOLIC PANEL, NON-FASTING   Result Value Ref Range    SODIUM 142 136 - 145 mmol/Bailey    POTASSIUM 3.8 3.5 - 5.1 mmol/Bailey    CHLORIDE 111 96 - 111 mmol/Bailey    CO2 TOTAL 22 (Bailey) 23 - 31 mmol/Bailey    ANION GAP 9 4 - 13 mmol/Bailey    BUN 12 8 - 25 mg/dL    CREATININE 1.00 0.60 - 1.05 mg/dL    BUN/CREA RATIO 12 6 - 22    ALBUMIN 3.0 (Bailey) 3.4 - 4.8 g/dL     CALCIUM 8.7 8.6 - 10.3 mg/dL    GLUCOSE 103 65 - 125 mg/dL     ALKALINE PHOSPHATASE 88 55 - 145 U/Bailey    ALT (SGPT) 22 8 - 22 U/Bailey    AST (SGOT)  35 8 - 45 U/Bailey    BILIRUBIN TOTAL 0.5 0.3 - 1.3 mg/dL    PROTEIN TOTAL 6.2 6.0 - 8.0 g/dL    ESTIMATED GFR - FEMALE 52 (Bailey) >=60 mL/min/BSA   CBC WITH DIFF   Result Value Ref Range    WBC 5.0 3.7 - 11.0 x10^3/uL    RBC 3.54 (Bailey) 3.85 - 5.22 x10^6/uL    HGB 8.2 (Bailey) 11.5 - 16.0 g/dL    HCT 26.9 (Bailey) 34.8 - 46.0 %    MCV 76.0 (Bailey) 78.0 - 100.0 fL    MCH 23.2 (Bailey) 26.0 - 32.0 pg    MCHC 30.5 (Bailey) 31.0 - 35.5 g/dL    RDW-CV 18.1 (H) 11.5 - 15.5 %    PLATELETS 122 (Bailey) 150 - 400 x10^3/uL    MPV 11.2 8.7 - 12.5 fL    NEUTROPHIL % 57 %    LYMPHOCYTE % 30 %    MONOCYTE % 10 %    EOSINOPHIL % 2 %    BASOPHIL % 1 %    NEUTROPHIL # 2.89 1.50 - 7.70 x10^3/uL    LYMPHOCYTE # 1.49 1.00 - 4.80 x10^3/uL    MONOCYTE # 0.49 0.20 - 1.10 x10^3/uL    EOSINOPHIL # 0.11 <=0.50 x10^3/uL    BASOPHIL # <0.10 <=0.20 x10^3/uL    IMMATURE GRANULOCYTE % 0 0 - 1 %    IMMATURE GRANULOCYTE # <0.10 <0.10 x10^3/uL       Imaging Studies:    CT ABDOMEN PELVIS W IV CONTRAST   Final Result by Edi, Radresults In (10/19 0147)         1. Mild diffuse gastric wall thickening, correlate for gastritis.      2. Subacute appearing healing fractures of left anterolateral fifth, sixth, and seventh ribs.      3. Cirrhotic liver morphology with splenomegaly.      4. Increase in size of distal abdominal aortic aneurysm measuring 4.7 x 5 cm.                  The CT exam was performed using one or more of the following dose reduction techniques: Automated exposure control, adjustment of the mA and/or kV according to the patient's size, or use of iterative reconstruction technique.         Radiologist location ID: [id number]         CT BRAIN WO IV CONTRAST   Final Result by Edi,  Radresults In (10/19 0128)   No acute intracranial process.               The CT exam was performed using one or more of the following dose reduction techniques: Automated exposure control, adjustment of the mA and/or kV  according to the patient's size, or use of iterative reconstruction technique.               Radiologist location ID: [id number]             DISCHARGE INSTRUCTIONS:   Follow-up Information       Naomie Dean, DO Follow up in 1 week(s).    Specialty: EXTERNAL  Contact information:  [address]  [address]  [place] [address]  [phone]                              DISCHARGE INSTRUCTION - DIET     Diet: RESUME HOME DIET      DISCHARGE INSTRUCTION - ACTIVITY     Activity: AS TOLERATED      DME - HOSPITAL BED    Place order for variable height bed if patient requires bed height different than a fixed height bed to permit transfers to chair, w/c or standing position.  THIS OPTION NOT AVAILABLE FOR [facility] PATIENTS.    Place order for semi-electric bed if patient requires frequent changes in body position or has an immediate need for a change in body position.         Ht 172.7 cm    Wt 72.35 kg    Current Attending: Anne Hahn    I certify this pt is under my care as an attending physician & that I, or a collaborating ANP/PA had a face to face encounter with the pt or the durable medical power of attorney, as appropriate and explained the need for DME on the following date: 03/31/2022    I certify that a hospital bed is necessary due to The patient has a medical condition which requires positioning of the body in ways not feasible with an ordinary bed    Bed Type Variable Height-requires  bed height different than a fixed height bed to permit transfers to chair, w/c or standing position    Start Date 03/31/2022    Freedom of Choice: I have informed patient of their freedom of choice with respect to DME providers             CONDITION ON DISCHARGE: Alert    DISCHARGE DISPOSITION:  {Home discharge     I spent a total of (30) minutes in direct/indirect care of this patient including initial evaluation, review of laboratory, radiology, diagnostic studies, review of medical record, order entry and coordination of care.      Eugenio Hoes, PA-C    Care Team       PCP       Name Type Specialty Phone Number    Naomie Dean, DO (Inactive) Not available EXTERNAL [phone]              Care Team       No care team found                   I personally saw and evaluated the patient as part of a shared service with an APP.    My substantive findings are:  SUBSTANTIVE FINDINGS: MDM (complete) patient is admitted  with acute upper GI bleed.  After conservative management the patient's hemoglobin stabilized and her BUN and creatinine returned to normal.  The patient was started on ferrous sulfate, Protonix and Carafate at home.  Also the patient had been given a slightly higher dose of Klonopin at night and she rested much better with this we will in continue this slightly higher dose at home to help with rest.    I independently of the APP spent a total of (20) minutes in direct/indirect care of this patient including initial evaluation, review of laboratory, radiology, diagnostic studies, review of medical record, order entry and coordination of care.

## 2022-03-31 NOTE — Nurses Notes (Signed)
Patient was discharged from the [facility] floor via wheelchair.

## 2022-03-31 NOTE — Nurses Notes (Signed)
Patient discharged home with family.  AVS reviewed with patient/care giver.  A written copy of the AVS and discharge instructions was given to the patient/care giver.  Questions sufficiently answered as needed.  Patient/care giver encouraged to follow up with PCP as indicated.  In the event of an emergency, patient/care giver instructed to call 911 or go to the nearest emergency room.

## 2022-03-31 NOTE — Care Plan (Signed)
Problem: Adult Inpatient Plan of Care  Goal: Absence of Hospital-Acquired Illness or Injury  Intervention: Prevent Infection  Recent Flowsheet Documentation  Taken 03/30/2022 2050 by Kathlen Brunswick, LPN  Infection Prevention:   promote handwashing   rest/sleep promoted   single patient room provided     Problem: Skin Injury Risk Increased  Goal: Skin Health and Integrity  Intervention: Optimize Skin Protection  Recent Flowsheet Documentation  Taken 03/30/2022 2050 by Kathlen Brunswick, LPN  Pressure Reduction Techniques:   Heels elevated off of the bed   Frequent weight shifting encouraged  Pressure Reduction Devices:   Heel offloading device utilized   Repositioning wedges/pillows utilized  Skin Protection:   adhesive use limited   transparent dressing maintained   tubing/devices free from skin contact     Problem: Pain Acute  Goal: Optimal Pain Control and Function  Outcome: Ongoing (see interventions/notes)  Intervention: Prevent or Manage Pain  Recent Flowsheet Documentation  Taken 03/30/2022 2050 by Kathlen Brunswick, LPN  Sleep/Rest Enhancement:   awakenings minimized   room darkened   relaxation techniques promoted  Bowel Elimination Promotion:   adequate fluid intake promoted   ambulation promoted  Intervention: [place] Documentation  Taken 03/30/2022 2050 by Kathlen Brunswick, LPN  Diversional Activities: television     Problem: Gastrointestinal Bleeding  Goal: Optimal Coping with Acute Illness  Outcome: Ongoing (see interventions/notes)       Patient calm and cooperative throughout shift. Patient has received PRN pain medication once throughout shift. Up with assistance to ambulate to restroom. Patient able to rest comfortably between care. Denies needing anything at this time. Bedside table, belongings, and call light within reach.

## 2022-04-02 ENCOUNTER — Telehealth (HOSPITAL_COMMUNITY): Payer: Self-pay

## 2022-04-02 NOTE — Telephone Encounter (Signed)
Transition of Care Contact Information  Discharge Date: 03/31/2022  Transition Facility Type--Hospital (Inpatient or Observation)  [facility]  Interactive Contact(s): Completed or attempted contact indicated by Date/Time  Completed Contact: 04/02/2022 10:00 AM  Contact Method(s)-- Patient/Caregiver Telephone  Clinical Staff Name/Role who Epifania Gore, LPN  Transition Assessment  Discharge Summary obtained?--Yes  How are you recovering?--Improving  Discharge Meds obtained?--Yes  Medications reconcilled with Discharge Documentation?--Yes  Medication understanding --knows new medication(s)--knows purpose of medication  Medication Concerns?--No  Have everything needed for recovery?--Yes  Care Coordination:   Patient has transition follow-up appointment date and time?  Primary Care Transition Visit planned?--Yes  Specialist Transition Visit planned?  Patient/caregiver plans to attend transition visit?--Yes  Primary Follow-up Barrier  Interventions provided --reinforced discharge instructions--med list reviewed with caregiver  Home Health or DME ordered at discharge?--Yes  Agency has contacted patient to initiate services?  Home Health or DME Agency:  Clinician/Team notified?  Primary reason clinician notified?  Transition Note:Spoke with caregiver, Tye [place].  States Amaal is doing well.  Denies any further GI symptoms.  Home medications taken as directed.  Order was obtained for hospital bed on discharge, but would like to hold off on ordering.  Prefers to be in her own bed for as long as possible.  No new complaints or concerns.  Opportunity given for questions regarding discharge.    Unable to Complete TCM due to:  Further information may be documented in relevant telephone or outreach encounter.

## 2022-06-13 ENCOUNTER — Emergency Department
Admission: EM | Admit: 2022-06-13 | Discharge: 2022-06-13 | Disposition: A | Discharge status: Home / Self Care | Payer: Medicare PPO | Attending: NURSE PRACTITIONER, FAMILY | Admitting: NURSE PRACTITIONER, FAMILY

## 2022-06-13 ENCOUNTER — Emergency Department (HOSPITAL_COMMUNITY): Payer: Medicare PPO

## 2022-06-13 ENCOUNTER — Other Ambulatory Visit: Payer: Self-pay

## 2022-06-13 ENCOUNTER — Encounter (HOSPITAL_COMMUNITY): Payer: Self-pay

## 2022-06-13 DIAGNOSIS — Z1152 Encounter for screening for COVID-19: Secondary | ICD-10-CM | POA: Insufficient documentation

## 2022-06-13 DIAGNOSIS — J210 Acute bronchiolitis due to respiratory syncytial virus: Secondary | ICD-10-CM | POA: Insufficient documentation

## 2022-06-13 LAB — COVID-19 ~~LOC~~ MOLECULAR LAB TESTING
INFLUENZA VIRUS TYPE A: NOT DETECTED
INFLUENZA VIRUS TYPE B: NOT DETECTED
RESPIRATORY SYNCTIAL VIRUS (RSV): DETECTED — AB
SARS-CoV-2: NOT DETECTED

## 2022-06-13 MED ORDER — IPRATROPIUM 0.5 MG-ALBUTEROL 3 MG (2.5 MG BASE)/3 ML NEBULIZATION SOLN
3.0000 mL | INHALATION_SOLUTION | RESPIRATORY_TRACT | 0 refills | Status: AC | PRN
Start: 2022-06-13 — End: ?

## 2022-06-13 MED ORDER — IPRATROPIUM 0.5 MG-ALBUTEROL 3 MG (2.5 MG BASE)/3 ML NEBULIZATION SOLN
3.0000 mL | INHALATION_SOLUTION | RESPIRATORY_TRACT | Status: AC
Start: 2022-06-13 — End: 2022-06-13
  Administered 2022-06-13: 3 mL via RESPIRATORY_TRACT
  Filled 2022-06-13: qty 3

## 2022-06-13 MED ORDER — PROMETHAZINE-DM 6.25 MG-15 MG/5 ML ORAL SYRUP
5.0000 mL | ORAL_SOLUTION | Freq: Four times a day (QID) | ORAL | 0 refills | Status: DC | PRN
Start: 2022-06-13 — End: 2022-08-05

## 2022-06-13 MED ORDER — PREDNISONE 20 MG TABLET
20.0000 mg | ORAL_TABLET | Freq: Every day | ORAL | 0 refills | Status: AC
Start: 2022-06-13 — End: 2022-06-20

## 2022-06-13 MED ORDER — PREDNISONE 10 MG TABLET
40.0000 mg | ORAL_TABLET | ORAL | Status: AC
Start: 2022-06-13 — End: 2022-06-13
  Administered 2022-06-13: 40 mg via ORAL
  Filled 2022-06-13: qty 4

## 2022-06-13 NOTE — ED Nurses Note (Addendum)
Patient discharged home. AVS reviewed with patient/caregiver. A written copy of discharge instructions was provided.  Questions sufficiently answered as needed. Patient encouraged to follow up with PCP as indicated. In the event of an emergency, patient instructed to call 911 or go to the nearest emergency room. Alert. Oriented. Condition stable. To vehicle via wheelchair. Discharged home with family. Rx escribed.  Patient reports feeling better after neb tx.

## 2022-06-13 NOTE — ED Triage Notes (Signed)
Pt came in with complaints of cough, congestion, shortness of breath. [facility] and was given doxycycline 100 mg and benzonatate but it is her symptoms are getting worse.

## 2022-06-13 NOTE — ED Nurses Note (Signed)
Awaiting lab results and neb treatment for disposition. Patient alert. Oriented. Stable. Side rails up x 2. Call bell within reach.

## 2022-06-13 NOTE — Discharge Instructions (Addendum)
Continue doxycycline until complete  Add prednisone every morning with breakfast  Breathing treatments every 4-6 hours around the clock  Tylenol 1000mg  every 8 hours around the clock  Cough syrup as needed-do not use Phenergan tablets with cough syrup-it is same drug.  Follow-up with PCP in 1 week  Return persistent or worse

## 2022-06-13 NOTE — ED Provider Notes (Signed)
Emergency Department  [facility]   06/13/2022     Tricia Bailey  November 28, 1924  89 y.o.  female  [name] [address]   [phone] (home)  PCP: No Pcp   [id number]    Date of service:06/13/2022 12:20    Chief Complaint:   Chief Complaint   Patient presents with    Flu Like Symptoms       Arrival: The patient arrived by Car and is accompanied by caregiver    HPI: This is a 89 y.o. female who presents to the emergency department with persistent cough-fatigue-malaise-not necessarily short of breath.  Evaluated 12 30 23  local urgent care and treated with doxycycline-tessalon perls, caregivers concerned as her cough has worsened.  No past medical history of COPD or supplemental oxygen.  Denies chest pain.  Reportedly negative covid at urgent care.              Past Medical History:   Past Medical History:   Diagnosis Date    Arthropathy     COPD (chronic obstructive pulmonary disease) (CMS HCC)     GERD (gastroesophageal reflux disease)     HTN (hypertension)        Past Surgical History: History reviewed. No pertinent surgical history.    Social History:   Social History     Tobacco Use    Smoking status: Never    Smokeless tobacco: Never   Vaping Use    Vaping Use: Never used   Substance Use Topics    Alcohol use: Never    Drug use: Never        Family History:  No family history on file.        Medications Prior to Admission       Prescriptions    albuterol sulfate (PROVENTIL OR VENTOLIN OR PROAIR) 90 mcg/actuation Inhalation HFA Aerosol Inhaler    Take 1-2 Puffs by inhalation Every 6 hours as needed    amLODIPine (NORVASC) 2.5 mg Oral Tablet    Take 2.5 mg by mouth Twice daily    clonazePAM (KLONOPIN) 0.5 mg Oral Tablet, Rapid Dissolve    Take 1 Tablet (0.5 mg total) by mouth Every 12 hours as needed for Anxiety or Other (dizziness)    cyclobenzaprine (FLEXERIL) 10 mg Oral Tablet    Take 10 mg by mouth Three times a day    ferrous sulfate (FERATAB) 324 mg (65 mg iron) Oral Tablet,  Delayed Release (E.C.)    Take 1 Tablet (324 mg total) by mouth Every morning before breakfast    HYDROcodone-acetaminophen (NORCO) 5-325 mg Oral Tablet    Take 1 Tablet by mouth Every 6 hours as needed for Pain    levothyroxine (SYNTHROID) 25 mcg Oral Tablet    Take 25 mcg by mouth Every morning Unsure of dose    nebivoloL (BYSTOLIC) 10 mg Oral Tablet    Take 10 mg by mouth Twice daily    pantoprazole (PROTONIX) 40 mg Oral Tablet, Delayed Release (E.C.)    Take 1 Tablet (40 mg total) by mouth Once a day    promethazine (PHENERGAN) 25 mg Oral Tablet    Take 25 mg by mouth Every 6 hours as needed for Nausea/Vomiting            Allergies:   Allergies   Allergen Reactions    Cephalexin  Other Adverse Reaction (Add comment)     unknown    Hctz [Hydrochlorothiazide]  Other Adverse Reaction (Add comment)  unknown    Macrodantin [Nitrofurantoin]  Other Adverse Reaction (Add comment)     unknown    Minocin [Minocycline]  Other Adverse Reaction (Add comment)     unknown         Above history reviewed with patient.  Allergies, medication list, reviewed.        Physical exam:  Body mass index is 25.02 kg/m.  ED Triage Vitals [06/13/22 1126]   BP (Non-Invasive) 136/73   Heart Rate 65   Respiratory Rate 20   Temperature 36.5 C (97.7 F)   SpO2 97 %   Weight 70.3 kg (155 lb)   Height 1.676 m (5\' 6" )     Constitutional: patient is oriented to person, place, and time and well-developed, well-nourished, and in no distress.   HENT:   Head: Normocephalic and atraumatic.   Right Ear: External ear normal.  Hard of hearing  Left Ear: External ear normal.   Nose: Nose normal.   Mouth/Throat: Oropharynx is clear and moist.   Eyes: Pupils are equal, round, and reactive to light. Conjunctivae and EOM are normal.   Neck: Normal range of motion. Neck supple.   Cardiovascular: Normal rate, regular rhythm, normal heart sounds and intact distal pulses. 2- 3/6 aortic murmur  Pulmonary/Chest: Effort normal-frequent cough.  Productive.  Sputum  is clear.  Sats 97%-99% on room air.  Expiratory wheeze.  Abdominal: Soft. Bowel sounds are normal.   Musculoskeletal: gait not visualized  Neurological:Patient is alert and oriented to person, place.    Skin: Skin is warm and dry.   The following orders were placed after examining the patient :  Orders Placed This Encounter    XR AP MOBILE CHEST    COVID - 19 SCREENING - Symptomatic - PUI    CANCELED: INFLUENZA VIRUS TYPE A AND TYPE B, AND RESPIRATORY SYNCYTIAL VIRUS (RSV), PCR    RESP THERAPY INSTRUCT/EDUCATE    ipratropium-albuterol 0.5 mg-3 mg(2.5 mg base)/3 mL Solution for Nebulization    predniSONE (DELTASONE) tablet    ipratropium-albuterol 0.5 mg-3 mg(2.5 mg base)/3 mL Solution for Nebulization    predniSONE (DELTASONE) 20 mg Oral Tablet    promethazine-dextromethorphan (PHENERGAN-DM) 6.25-15 mg/5 mL Oral Syrup      XR AP MOBILE CHEST   Final Result   No acute cardiopulmonary process visualized.                        Radiologist location ID: [id number]            Results for orders placed or performed during the hospital encounter of 06/13/22 (from the past 12 hour(s))   COVID - 19 SCREENING - Symptomatic - PUI   Result Value Ref Range    SARS-CoV-2 Not Detected Not Detected    INFLUENZA VIRUS TYPE A Not Detected Not Detected    INFLUENZA VIRUS TYPE B Not Detected Not Detected    RESPIRATORY SYNCTIAL VIRUS (RSV) Detected (A) Not Detected         ED Course:       Medications Administered in the ED   predniSONE (DELTASONE) tablet (has no administration in time range)   ipratropium-albuterol 0.5 mg-3 mg(2.5 mg base)/3 mL Solution for Nebulization (3 mL Nebulization Given 06/13/22 1304)        Emergency Department Procedure:  Procedures     Medical Decision Making  Differential includes but not limited to COVID, flu, RSV, pneumonia.  Diagnostic testing reviewed, chest x-ray is clear, she is positive  for RSV.  Will continue doxycycline, add steroid, DuoNeb and promethazine DM.  Aware do not use together with p.o.  Phenergan.  Tylenol around the clock.  Rest, fluids, nutrition.  Follow-up PCP in 1 week    Amount and/or Complexity of Data Reviewed  Labs: ordered. Decision-making details documented in ED Course.  Radiology: ordered and independent interpretation performed. Decision-making details documented in ED Course.    Risk  OTC drugs.  Prescription drug management.         Findings and diagnosis discussed with patient.  Clinical Impression:   Clinical Impression   RSV (acute bronchiolitis due to respiratory syncytial virus) (Primary)         Disposition: Discharged          No follow-ups on file.   New Prescriptions    IPRATROPIUM-ALBUTEROL 0.5 MG-3 MG(2.5 MG BASE)/3 ML SOLUTION FOR NEBULIZATION    Take 3 mL by nebulization Every 4 hours as needed for Wheezing    PREDNISONE (DELTASONE) 20 MG ORAL TABLET    Take 1 Tablet (20 mg total) by mouth Once a day for 7 days    PROMETHAZINE-DEXTROMETHORPHAN (PHENERGAN-DM) 6.25-15 MG/5 ML ORAL SYRUP    Take 5 mL by mouth Four times a day as needed for Cough      PCP 1 WEEK            Future Appointments  No future appointments.       BP (!) 141/61   Pulse 65   Temp 36.5 C (97.7 F)   Resp 20   Ht 1.676 m (5\' 6" )   Wt 70.3 kg (155 lb)   SpO2 94%   BMI 25.02 kg/m         , APRN,NP-C1/08/2022              This note was partially created using voice recognition software and is inherently subject to errors including those of syntax and "sound alike " substitutions which may escape proof reading.  In such instances, original meaning may be extrapolated by contextual derivation.

## 2022-08-04 ENCOUNTER — Emergency Department (HOSPITAL_COMMUNITY): Payer: Medicare PPO

## 2022-08-04 ENCOUNTER — Observation Stay
Admission: EM | Admit: 2022-08-04 | Discharge: 2022-08-05 | Disposition: A | Discharge status: Home / Self Care | Payer: Medicare PPO | Attending: Family Medicine | Admitting: Family Medicine

## 2022-08-04 ENCOUNTER — Observation Stay (HOSPITAL_COMMUNITY): Payer: Medicare PPO

## 2022-08-04 ENCOUNTER — Other Ambulatory Visit: Payer: Self-pay

## 2022-08-04 ENCOUNTER — Encounter (HOSPITAL_COMMUNITY): Payer: Self-pay | Admitting: NURSE PRACTITIONER

## 2022-08-04 DIAGNOSIS — K219 Gastro-esophageal reflux disease without esophagitis: Secondary | ICD-10-CM | POA: Insufficient documentation

## 2022-08-04 DIAGNOSIS — F419 Anxiety disorder, unspecified: Secondary | ICD-10-CM | POA: Insufficient documentation

## 2022-08-04 DIAGNOSIS — R55 Syncope and collapse: Principal | ICD-10-CM | POA: Insufficient documentation

## 2022-08-04 DIAGNOSIS — Z1152 Encounter for screening for COVID-19: Secondary | ICD-10-CM | POA: Insufficient documentation

## 2022-08-04 DIAGNOSIS — R0600 Dyspnea, unspecified: Secondary | ICD-10-CM

## 2022-08-04 DIAGNOSIS — I440 Atrioventricular block, first degree: Secondary | ICD-10-CM | POA: Insufficient documentation

## 2022-08-04 DIAGNOSIS — I10 Essential (primary) hypertension: Secondary | ICD-10-CM | POA: Insufficient documentation

## 2022-08-04 DIAGNOSIS — E039 Hypothyroidism, unspecified: Secondary | ICD-10-CM | POA: Insufficient documentation

## 2022-08-04 LAB — COMPREHENSIVE METABOLIC PANEL, NON-FASTING
ALBUMIN: 3.8 g/dL (ref 3.4–4.8)
ALKALINE PHOSPHATASE: 102 U/L (ref 55–145)
ALT (SGPT): 38 U/L — ABNORMAL HIGH (ref 8–22)
ANION GAP: 9 mmol/L (ref 4–13)
AST (SGOT): 37 U/L (ref 8–45)
BILIRUBIN TOTAL: 0.9 mg/dL (ref 0.3–1.3)
BUN/CREA RATIO: 22 (ref 6–22)
BUN: 23 mg/dL (ref 8–25)
CALCIUM: 10.1 mg/dL (ref 8.6–10.3)
CHLORIDE: 105 mmol/L (ref 96–111)
CO2 TOTAL: 28 mmol/L (ref 23–31)
CREATININE: 1.05 mg/dL (ref 0.60–1.05)
ESTIMATED GFR - FEMALE: 49 mL/min/BSA — ABNORMAL LOW (ref >=60)
GLUCOSE: 100 mg/dL (ref 65–125)
POTASSIUM: 4.2 mmol/L (ref 3.5–5.1)
PROTEIN TOTAL: 8.2 g/dL — ABNORMAL HIGH (ref 6.0–8.0)
SODIUM: 142 mmol/L (ref 136–145)

## 2022-08-04 LAB — URINALYSIS, MACROSCOPIC
BILIRUBIN: NEGATIVE mg/dL
BLOOD: NEGATIVE mg/dL
GLUCOSE: NEGATIVE mg/dL
KETONES: NEGATIVE mg/dL
LEUKOCYTES: NEGATIVE WBCs/uL
NITRITE: NEGATIVE
PH: 7 (ref 5.0–8.5)
PROTEIN: NEGATIVE mg/dL
SPECIFIC GRAVITY: 1.01 (ref 1.005–1.030)
UROBILINOGEN: 0.2 mg/dL

## 2022-08-04 LAB — CBC WITH DIFF
BASOPHIL #: <0.1 10*3/uL (ref <=0.20)
BASOPHIL %: 0 %
EOSINOPHIL #: <0.1 10*3/uL (ref <=0.50)
EOSINOPHIL %: 1 %
HCT: 42.8 % (ref 34.8–46.0)
HGB: 13.8 g/dL (ref 11.5–16.0)
IMMATURE GRANULOCYTE #: <0.1 10*3/uL (ref <0.10)
IMMATURE GRANULOCYTE %: 0 % (ref 0.0–1.0)
LYMPHOCYTE #: 1.6 10*3/uL (ref 1.00–4.80)
LYMPHOCYTE %: 18 %
MCH: 25.8 pg — ABNORMAL LOW (ref 26.0–32.0)
MCHC: 32.2 g/dL (ref 31.0–35.5)
MCV: 80.1 fL (ref 78.0–100.0)
MONOCYTE #: 0.53 10*3/uL (ref 0.20–1.10)
MONOCYTE %: 6 %
MPV: 11 fL (ref 8.7–12.5)
NEUTROPHIL #: 6.49 10*3/uL (ref 1.50–7.70)
NEUTROPHIL %: 75 %
PLATELETS: 144 10*3/uL — ABNORMAL LOW (ref 150–400)
RBC: 5.34 10*6/uL — ABNORMAL HIGH (ref 3.85–5.22)
RDW-CV: 15.9 % — ABNORMAL HIGH (ref 11.5–15.5)
WBC: 8.7 10*3/uL (ref 3.7–11.0)

## 2022-08-04 LAB — ECG 12-LEAD
Atrial Rate: 62 {beats}/min
Calculated P Axis: 73 degrees
Calculated R Axis: -7 degrees
Calculated T Axis: 60 degrees
PR Interval: 210 ms
QRS Duration: 70 ms
QT Interval: 424 ms
QTC Calculation: 430 ms
Ventricular rate: 62 {beats}/min

## 2022-08-04 LAB — CAROTID ARTERY DUPLEX
Left CCA dist dias: 4.5 cm/s
Left CCA dist sys: 36.9 cm/s
Left CCA mid dias: 4.5
Left CCA mid dias: 6.4
Left CCA mid sys: 45.9
Left CCA prox dias: 2.9 cm/s
Left CCA prox sys: 49.7 cm/s
Left Carotid Bubl Sys: 54.2
Left Carotid Bulb Dias: 7.1
Left ECA sys: 67.4 cm/s
Left ICA dist dias: 14.9 cm/s
Left ICA dist sys: 76.1 cm/s
Left ICA mid dias: 11.6 cm/s
Left ICA mid sys: 65 cm/s
Left ICA prox dias: 5.5 cm/s
Left ICA prox sys: 41.1 cm/s
Left ICA/CCA sys: 2.06
Left vertebral sys: 36.9 cm/s
Right CCA dist dias: 8.8 cm/s
Right CCA mid sys: 54.9
Right CCA prox dias: 6.8 cm/s
Right CCA prox sys: 56.1 cm/s
Right Carotid Bulb Dias: 9.2
Right Carotid Bulb Sys: 59.7
Right ICA dist dias: 6.5 cm/s
Right ICA dist sys: 39.6 cm/s
Right ICA mid dias: 18.6 cm/s
Right ICA mid sys: 99 cm/s
Right ICA prox dias: 8 cm/s
Right ICA prox sys: 41.7 cm/s
Right ICA/CCA sys: 2.02 cm/s
Right cca dist sys: 48.9 cm/s
Right eca sys: 57.3 cm/s
Right vertebral sys: 30.5 cm/s

## 2022-08-04 LAB — URINALYSIS, MICROSCOPIC

## 2022-08-04 LAB — COVID-19 ~~LOC~~ MOLECULAR LAB TESTING
INFLUENZA VIRUS TYPE A: NOT DETECTED
INFLUENZA VIRUS TYPE B: NOT DETECTED
RESPIRATORY SYNCTIAL VIRUS (RSV): NOT DETECTED
SARS-CoV-2: NOT DETECTED

## 2022-08-04 LAB — GOLD TOP TUBE

## 2022-08-04 LAB — BLUE TOP TUBE

## 2022-08-04 LAB — TROPONIN-I
TROPONIN-I HS: 5.8 ng/L (ref <=14.0)
TROPONIN-I HS: 5.8 ng/L (ref <=14.0)

## 2022-08-04 LAB — GRAY TOP TUBE

## 2022-08-04 MED ORDER — AMLODIPINE 5 MG TABLET
2.5000 mg | ORAL_TABLET | Freq: Two times a day (BID) | ORAL | Status: DC
Start: 2022-08-04 — End: 2022-08-05
  Administered 2022-08-04 – 2022-08-05 (×2): 2.5 mg via ORAL
  Filled 2022-08-04 (×2): qty 1

## 2022-08-04 MED ORDER — SODIUM CHLORIDE 0.9 % (FLUSH) INJECTION SYRINGE
10.0000 mL | INJECTION | Freq: Three times a day (TID) | INTRAMUSCULAR | Status: DC
Start: 2022-08-04 — End: 2022-08-05
  Administered 2022-08-04 – 2022-08-05 (×3): 10 mL

## 2022-08-04 MED ORDER — NEBIVOLOL 10 MG TABLET
10.0000 mg | ORAL_TABLET | Freq: Two times a day (BID) | ORAL | Status: DC
Start: 2022-08-04 — End: 2022-08-05
  Administered 2022-08-04 – 2022-08-05 (×3): 0 mg via ORAL

## 2022-08-04 MED ORDER — SODIUM CHLORIDE 0.9 % (FLUSH) INJECTION SYRINGE
10.0000 mL | INJECTION | INTRAMUSCULAR | Status: DC | PRN
Start: 2022-08-04 — End: 2022-08-05

## 2022-08-04 MED ORDER — PANTOPRAZOLE 40 MG TABLET,DELAYED RELEASE
40.0000 mg | DELAYED_RELEASE_TABLET | Freq: Every morning | ORAL | Status: DC
Start: 2022-08-05 — End: 2022-08-05
  Administered 2022-08-05: 40 mg via ORAL
  Filled 2022-08-04: qty 1

## 2022-08-04 MED ORDER — LEVOTHYROXINE 25 MCG TABLET
25.0000 ug | ORAL_TABLET | Freq: Every morning | ORAL | Status: DC
Start: 2022-08-04 — End: 2022-08-05
  Administered 2022-08-04 – 2022-08-05 (×2): 25 ug via ORAL
  Filled 2022-08-04 (×2): qty 1

## 2022-08-04 MED ORDER — CLONAZEPAM 1 MG TABLET
0.5000 mg | ORAL_TABLET | Freq: Two times a day (BID) | ORAL | Status: DC | PRN
Start: 2022-08-04 — End: 2022-08-05

## 2022-08-04 MED ORDER — PROMETHAZINE 25 MG TABLET
25.0000 mg | ORAL_TABLET | Freq: Four times a day (QID) | ORAL | Status: DC | PRN
Start: 2022-08-04 — End: 2022-08-05

## 2022-08-04 MED ORDER — HYDROCODONE 5 MG-ACETAMINOPHEN 325 MG TABLET
1.0000 | ORAL_TABLET | Freq: Four times a day (QID) | ORAL | Status: DC | PRN
Start: 2022-08-04 — End: 2022-08-05
  Administered 2022-08-04: 1 via ORAL
  Filled 2022-08-04: qty 1

## 2022-08-04 MED ORDER — IPRATROPIUM 0.5 MG-ALBUTEROL 3 MG (2.5 MG BASE)/3 ML NEBULIZATION SOLN
3.0000 mL | INHALATION_SOLUTION | Freq: Two times a day (BID) | RESPIRATORY_TRACT | Status: DC
Start: 2022-08-04 — End: 2022-08-05
  Administered 2022-08-04: 0 mL via RESPIRATORY_TRACT
  Administered 2022-08-05: 3 mL via RESPIRATORY_TRACT
  Filled 2022-08-04: qty 3

## 2022-08-04 MED ORDER — FERROUS SULFATE 325 MG (65 MG IRON) TABLET
325.0000 mg | ORAL_TABLET | Freq: Every morning | ORAL | Status: DC
Start: 2022-08-05 — End: 2022-08-05
  Administered 2022-08-05: 325 mg via ORAL
  Filled 2022-08-04: qty 1

## 2022-08-04 MED ORDER — ONDANSETRON HCL (PF) 4 MG/2 ML INJECTION SOLUTION
4.0000 mg | Freq: Four times a day (QID) | INTRAMUSCULAR | Status: DC | PRN
Start: 2022-08-04 — End: 2022-08-05

## 2022-08-04 MED ORDER — HYDRALAZINE 20 MG/ML INJECTION SOLUTION
10.0000 mg | Freq: Four times a day (QID) | INTRAMUSCULAR | Status: DC | PRN
Start: 2022-08-04 — End: 2022-08-05

## 2022-08-04 MED ORDER — ENOXAPARIN 40 MG/0.4 ML SUBCUTANEOUS SYRINGE
40.0000 mg | INJECTION | SUBCUTANEOUS | Status: DC
Start: 2022-08-04 — End: 2022-08-05
  Administered 2022-08-04: 40 mg via SUBCUTANEOUS
  Filled 2022-08-04: qty 0.4

## 2022-08-04 MED ORDER — ACETAMINOPHEN 325 MG TABLET
650.0000 mg | ORAL_TABLET | ORAL | Status: DC | PRN
Start: 2022-08-04 — End: 2022-08-05

## 2022-08-04 MED ORDER — MAGNESIUM HYDROXIDE 400 MG/5 ML ORAL SUSPENSION
15.0000 mL | Freq: Every day | ORAL | Status: DC | PRN
Start: 2022-08-04 — End: 2022-08-05

## 2022-08-04 MED ORDER — FERROUS SULFATE 324 MG (65 MG IRON) TABLET,DELAYED RELEASE
324.0000 mg | DELAYED_RELEASE_TABLET | Freq: Every morning | ORAL | Status: DC
Start: 2022-08-05 — End: 2022-08-04

## 2022-08-04 MED ORDER — LACTULOSE 20 GRAM/30 ML ORAL SOLUTION
15.0000 mL | Freq: Three times a day (TID) | ORAL | Status: DC | PRN
Start: 2022-08-04 — End: 2022-08-05

## 2022-08-04 MED ORDER — CYCLOBENZAPRINE 10 MG TABLET
10.0000 mg | ORAL_TABLET | Freq: Three times a day (TID) | ORAL | Status: DC
Start: 2022-08-04 — End: 2022-08-05
  Administered 2022-08-04 – 2022-08-05 (×3): 10 mg via ORAL
  Filled 2022-08-04 (×3): qty 1

## 2022-08-04 MED ORDER — DIPHENHYDRAMINE 25 MG CAPSULE
25.0000 mg | ORAL_CAPSULE | Freq: Every evening | ORAL | Status: DC | PRN
Start: 2022-08-04 — End: 2022-08-05

## 2022-08-04 MED ORDER — METOPROLOL TARTRATE 5 MG/5 ML INTRAVENOUS SOLUTION
5.0000 mg | Freq: Four times a day (QID) | INTRAVENOUS | Status: DC | PRN
Start: 2022-08-04 — End: 2022-08-05
  Administered 2022-08-04: 5 mg via INTRAVENOUS
  Filled 2022-08-04: qty 5

## 2022-08-04 MED ORDER — ALBUTEROL SULFATE HFA 90 MCG/ACTUATION AEROSOL INHALER
2.0000 | INHALATION_SPRAY | Freq: Four times a day (QID) | RESPIRATORY_TRACT | Status: DC | PRN
Start: 2022-08-04 — End: 2022-08-05

## 2022-08-04 MED ORDER — CLONAZEPAM 0.5 MG DISINTEGRATING TABLET
0.5000 mg | ORAL_TABLET | Freq: Two times a day (BID) | ORAL | Status: DC | PRN
Start: 2022-08-04 — End: 2022-08-04

## 2022-08-04 NOTE — ED Triage Notes (Signed)
Fall from standing position this AM 1030 at patient residence, patient ambulating through home and "legs gave out and went down." She crawled from back door to phone for help. She lives alone. Complains of LUE, LLE, RLE pain

## 2022-08-04 NOTE — H&P (Signed)
[facility]  Hospitalist  History & Physical    Date of Service:  08/04/2022  Tricia, Bailey, 89 y.o. female  Date of Admission:  08/04/2022  Date of Birth:  06-Dec-1924  PCP: No Pcp    Chief Complaint:  Syncope    HPI:  Tricia Bailey is a 89 y.o. White female who is admitted for syncope and collapse.  Patient presented  the emergency department via EMS for syncopal episodes.  Patient says that she has had multiple syncopal episodes in the last couple of days.  Most recent 1 was today.  She says that she does not have any prodromal symptoms.  She says she will be standing and then the next thing she knows she was on the floor.  She denies any specific injury.  She denies any chest pain shortness on breath palpitations nausea vomiting or diarrhea.  She has not had any recent fevers chills cough or congestion.  She is very hard of hearing so the history is limited.     Workup in the emergency department was negative including CT of the brain CT cervical spine chest x-ray EKG and labs.  Patient remains asymptomatic.  We will place patient under our service for continued observation serial cardiac enzymes carotid Dopplers and transthoracic echo    Past Medical History:   Diagnosis Date    Arthropathy     COPD (chronic obstructive pulmonary disease) (CMS HCC)     GERD (gastroesophageal reflux disease)     HTN (hypertension)       History reviewed. No pertinent surgical history.   Social History     Tobacco Use    Smoking status: Never    Smokeless tobacco: Never   Vaping Use    Vaping status: Never Used   Substance Use Topics    Alcohol use: Never    Drug use: Never       Family Medical History:    None        Medications Prior to Admission       Prescriptions    albuterol sulfate (PROVENTIL OR VENTOLIN OR PROAIR) 90 mcg/actuation Inhalation HFA Aerosol Inhaler    Take 1-2 Puffs by inhalation Every 6 hours as needed    amLODIPine (NORVASC) 2.5 mg Oral Tablet    Take 2.5 mg by mouth Twice  daily    clonazePAM (KLONOPIN) 0.5 mg Oral Tablet, Rapid Dissolve    Take 1 Tablet (0.5 mg total) by mouth Every 12 hours as needed for Anxiety or Other (dizziness)    cyclobenzaprine (FLEXERIL) 10 mg Oral Tablet    Take 10 mg by mouth Three times a day    ferrous sulfate (FERATAB) 324 mg (65 mg iron) Oral Tablet, Delayed Release (E.C.)    Take 1 Tablet (324 mg total) by mouth Every morning before breakfast    HYDROcodone-acetaminophen (NORCO) 5-325 mg Oral Tablet    Take 1 Tablet by mouth Every 6 hours as needed for Pain    ipratropium-albuterol 0.5 mg-3 mg(2.5 mg base)/3 mL Solution for Nebulization    Take 3 mL by nebulization Every 4 hours as needed for Wheezing    levothyroxine (SYNTHROID) 25 mcg Oral Tablet    Take 25 mcg by mouth Every morning Unsure of dose    nebivoloL (BYSTOLIC) 10 mg Oral Tablet    Take 10 mg by mouth Twice daily    pantoprazole (PROTONIX) 40 mg Oral Tablet, Delayed Release (E.C.)    Take 1 Tablet (  40 mg total) by mouth Once a day    promethazine (PHENERGAN) 25 mg Oral Tablet    Take 25 mg by mouth Every 6 hours as needed for Nausea/Vomiting    promethazine-dextromethorphan (PHENERGAN-DM) 6.25-15 mg/5 mL Oral Syrup    Take 5 mL by mouth Four times a day as needed for Cough           Allergies   Allergen Reactions    Cephalexin  Other Adverse Reaction (Add comment)     unknown    Hctz [Hydrochlorothiazide]  Other Adverse Reaction (Add comment)     unknown    Macrodantin [Nitrofurantoin]  Other Adverse Reaction (Add comment)     unknown    Minocin [Minocycline]  Other Adverse Reaction (Add comment)     unknown          Review of Systems   Constitutional:  Negative for chills, fever and malaise/fatigue.        Review of systems as below.  Additional systems reviewed in HPI.     HENT:  Negative for congestion, sinus pain, sore throat and tinnitus.    Eyes:  Negative for blurred vision, photophobia, pain and redness.   Respiratory:  Negative for cough, hemoptysis, shortness of breath and  wheezing.    Cardiovascular:  Negative for chest pain, palpitations, orthopnea, leg swelling and PND.   Gastrointestinal:  Negative for abdominal pain, blood in stool, diarrhea, heartburn, nausea and vomiting.   Genitourinary:  Negative for dysuria, frequency, hematuria and urgency.   Musculoskeletal:  Negative for back pain, joint pain, myalgias and neck pain.   Skin:  Negative for rash.   Neurological:  Negative for dizziness, sensory change, speech change, focal weakness, weakness and headaches.   Endo/Heme/Allergies:  Negative for environmental allergies. Does not bruise/bleed easily.   Psychiatric/Behavioral:  Negative for depression, hallucinations, memory loss, substance abuse and suicidal ideas.           Filed Vitals:    08/04/22 1203   BP: (!) 179/93   Pulse: 66   Resp: 16   Temp: 37.1 C (98.7 F)   SpO2: 96%       Physical Exam     Constitutional:       Comments: Chronically ill-appearing female who is hard of hearing sitting upright in bed.  She does not appear to be in acute distress   HENT:      Head: Normocephalic and atraumatic.      Right Ear: External ear normal.      Left Ear: External ear normal.      Nose: Nose normal.   Eyes:      Pupils: Pupils are equal, round, and reactive to light.   Cardiovascular:      Rate and Rhythm: Normal rate and regular rhythm.      Heart sounds: Normal heart sounds.   Pulmonary:      Breath sounds: Normal breath sounds.   Abdominal:      General: Bowel sounds are normal. There is no distension.      Palpations: Abdomen is soft.      Tenderness: There is no abdominal tenderness.   Musculoskeletal:         General: No tenderness or deformity. Normal range of motion.      Cervical back: Normal range of motion and neck supple.   Skin:     General: Skin is warm and dry.      Findings: No rash.   Neurological:  Mental Status: She is alert. Mental status is at baseline.      Cranial Nerves: No cranial nerve deficit.      Deep Tendon Reflexes: Reflexes are normal and  symmetric.   Psychiatric:         Mood and Affect: Affect normal.         Cognition and Memory: Memory normal.      Comments: At baseline     Laboratory Data:     Results for orders placed or performed during the hospital encounter of 08/04/22 (from the past 24 hour(s))   COVID - 19 SCREENING - Symptomatic - PUI   Result Value Ref Range    SARS-CoV-2 Not Detected Not Detected    INFLUENZA VIRUS TYPE A Not Detected Not Detected    INFLUENZA VIRUS TYPE B Not Detected Not Detected    RESPIRATORY SYNCTIAL VIRUS (RSV) Not Detected Not Detected   ECG 12-LEAD   Result Value Ref Range    Ventricular rate 62 BPM    Atrial Rate 62 BPM    PR Interval 210 ms    QRS Duration 70 ms    QT Interval 424 ms    QTC Calculation 430 ms    Calculated P Axis 73 degrees    Calculated R Axis -7 degrees    Calculated T Axis 60 degrees   COMPREHENSIVE METABOLIC PANEL, NON-FASTING   Result Value Ref Range    SODIUM 142 136 - 145 mmol/L    POTASSIUM 4.2 3.5 - 5.1 mmol/L    CHLORIDE 105 96 - 111 mmol/L    CO2 TOTAL 28 23 - 31 mmol/L    ANION GAP 9 4 - 13 mmol/L    BUN 23 8 - 25 mg/dL    CREATININE 1.05 0.60 - 1.05 mg/dL    BUN/CREA RATIO 22 6 - 22    ALBUMIN 3.8 3.4 - 4.8 g/dL     CALCIUM 10.1 8.6 - 10.3 mg/dL    GLUCOSE 100 65 - 125 mg/dL    ALKALINE PHOSPHATASE 102 55 - 145 U/L    ALT (SGPT) 38 (H) 8 - 22 U/L    AST (SGOT)  37 8 - 45 U/L    BILIRUBIN TOTAL 0.9 0.3 - 1.3 mg/dL    PROTEIN TOTAL 8.2 (H) 6.0 - 8.0 g/dL    ESTIMATED GFR - FEMALE 49 (L) >=60 mL/min/BSA   TROPONIN-I   Result Value Ref Range    TROPONIN-I HS 5.8 <=14.0 ng/L ng/L   CBC WITH DIFF   Result Value Ref Range    WBC 8.7 3.7 - 11.0 x10^3/uL    RBC 5.34 (H) 3.85 - 5.22 x10^6/uL    HGB 13.8 11.5 - 16.0 g/dL    HCT 42.8 34.8 - 46.0 %    MCV 80.1 78.0 - 100.0 fL    MCH 25.8 (L) 26.0 - 32.0 pg    MCHC 32.2 31.0 - 35.5 g/dL    RDW-CV 15.9 (H) 11.5 - 15.5 %    PLATELETS 144 (L) 150 - 400 x10^3/uL    MPV 11.0 8.7 - 12.5 fL    NEUTROPHIL % 75.0 %    LYMPHOCYTE % 18.0 %    MONOCYTE %  6.0 %    EOSINOPHIL % 1.0 %    BASOPHIL % 0.0 %    NEUTROPHIL # 6.49 1.50 - 7.70 x10^3/uL    LYMPHOCYTE # 1.60 1.00 - 4.80 x10^3/uL    MONOCYTE # 0.53 0.20 - 1.10 x10^3/uL  EOSINOPHIL # <0.10 <=0.50 x10^3/uL    BASOPHIL # <0.10 <=0.20 x10^3/uL    IMMATURE GRANULOCYTE % 0.0 0.0 - 1.0 %    IMMATURE GRANULOCYTE # <0.10 <0.10 x10^3/uL   URINALYSIS, MACROSCOPIC   Result Value Ref Range    COLOR Yellow Yellow, Straw    APPEARANCE Clear Clear, Slightly Hazy    SPECIFIC GRAVITY 1.010 1.005 - 1.030    PH 7.0 5.0 - 8.5    LEUKOCYTES Negative Negative WBCs/uL    NITRITE Negative Negative    PROTEIN Negative Negative mg/dL    GLUCOSE Negative Negative mg/dL    KETONES Negative Negative mg/dL    UROBILINOGEN 0.2 0.2 mg/dL    BILIRUBIN Negative Negative mg/dL    BLOOD Negative Negative mg/dL   URINALYSIS, MICROSCOPIC   Result Value Ref Range    RBCS 0-2 None, Occasional, 0-2, 3-5 /hpf    WBCS None None, Occasional, 0-2, 3-5 /hpf    BACTERIA None None /hpf    SQUAMOUS EPITHELIAL 3-5 None, Occasional, 0-2, 3-5 /hpf       Imaging Studies:    CT CERVICAL SPINE WO IV CONTRAST   Final Result by Edi, Radresults In (02/24 1246)      No acute fracture of the cervical spine.      Minimal anterolisthesis C3 on C4, possibly degenerative in etiology.             The CT exam was performed using one or more of the following dose reduction techniques: Automated exposure control, adjustment of the mA and/or kV according to the patient's size, or use of iterative reconstruction technique.               Radiologist location ID: [id number]         CT BRAIN WO IV CONTRAST   Final Result by Edi, Radresults In (02/24 1234)   1. Mild chronic microvascular ischemic change.   2. No acute intracranial process by CT exam.                   The CT exam was performed using one or more of the following dose reduction techniques: Automated exposure control, adjustment of the mA and/or kV according to the patient's size, or use of iterative reconstruction  technique.               Radiologist location ID: [id number]         XR AP MOBILE CHEST   Final Result by Edi, Radresults In (02/24 1233)   Limited chest. No definite acute pulmonary process                        Radiologist location ID: [id number]             Assessment/Plan:  Active Hospital Problems    Diagnosis    Primary Problem: Syncope and collapse       1. Syncope and collapse.  Patient be admitted our service with serial cardiac enzymes will get a carotid Doppler and echo.  Patient may need a Holter monitor at home.  2. GERD continue PPI therapy  3. Hypothyroidism continue levothyroxine  4. Hypertension will continue Bystolic and amlodipine  5. Anxiety continue clonazepam   6. DVT prophylaxis Lovenox 40 subQ daily     Estimated length stay 1 midnight      I spent a total of (30) minutes in direct/indirect care of this patient including initial evaluation, review of laboratory, radiology, diagnostic  studies, review of medical record, order entry and coordination of care.     Birdie Hopes, PA-C    I personally saw and evaluated the patient as part of a shared service with an APP.    My substantive findings are:  SUBSTANTIVE FINDINGS: MDM (complete) patient is admitted with syncope and collapse.  Initial workup in the emergency room was unremarkable with the patient will need to be monitor for serial cardiac enzymes carotid Doppler echo and if we see no evidence of arrhythmia on telemetry the patient may need continued Holter monitor at discharge.    I independently of the APP spent a total of (10) minutes in direct/indirect care of this patient including initial evaluation, review of laboratory, radiology, diagnostic studies, review of medical record, order entry and coordination of care.

## 2022-08-04 NOTE — Respiratory Therapy (Signed)
Patient does not take breathing medicine on regular basis did bid and prn treatments

## 2022-08-04 NOTE — ED Nurses Note (Signed)
Ila Mcgill called to check on patient. States she is MPOA. Updated Juliann Pulse that patient is awake, alert, answering questions appropriately.

## 2022-08-04 NOTE — ED Provider Notes (Signed)
[facility]  Emergency Department  Provider Note      RUSTIE GILL  May 05, 1925  89 y.o.  female  [place] [id number]   [phone] (home)  No Pcp    Chief Complaint:   Chief Complaint   Patient presents with    Trauma    Fall    Leg Pain       HPI: This is a 89 y.o. female who presents to the emergency department via EMS for syncopal episodes.  Patient says that she has had multiple syncopal episodes in the last couple of days.  Most recent 1 was today.  She says that she does not have any prodromal symptoms.  She says she will be standing and then the next thing she knows she was on the floor.  She denies any specific injury.  She denies any chest pain shortness on breath palpitations nausea vomiting or diarrhea.  She has not had any recent fevers chills cough or congestion.  She is very hard of hearing so the history is limited.      Past Medical History:   Past Medical History:   Diagnosis Date    Arthropathy     COPD (chronic obstructive pulmonary disease) (CMS HCC)     GERD (gastroesophageal reflux disease)     HTN (hypertension)        Past Surgical History: History reviewed. No pertinent surgical history.    Social History:   Social History     Tobacco Use    Smoking status: Never    Smokeless tobacco: Never   Vaping Use    Vaping status: Never Used   Substance Use Topics    Alcohol use: Never    Drug use: Never      Social History     Substance and Sexual Activity   Drug Use Never       Allergies:   Allergies   Allergen Reactions    Cephalexin  Other Adverse Reaction (Add comment)     unknown    Hctz [Hydrochlorothiazide]  Other Adverse Reaction (Add comment)     unknown    Macrodantin [Nitrofurantoin]  Other Adverse Reaction (Add comment)     unknown    Minocin [Minocycline]  Other Adverse Reaction (Add comment)     unknown         Medications: (Not in an outpatient encounter)         Review of Systems   Constitutional:  Negative for chills, fever and malaise/fatigue.         Review of systems as below.  Additional systems reviewed in HPI.     HENT:  Negative for congestion, sinus pain, sore throat and tinnitus.    Eyes:  Negative for blurred vision, photophobia, pain and redness.   Respiratory:  Negative for cough, hemoptysis, shortness of breath and wheezing.    Cardiovascular:  Negative for chest pain, palpitations, orthopnea, leg swelling and PND.   Gastrointestinal:  Negative for abdominal pain, blood in stool, diarrhea, heartburn, nausea and vomiting.   Genitourinary:  Negative for dysuria, frequency, hematuria and urgency.   Musculoskeletal:  Negative for back pain, joint pain, myalgias and neck pain.   Skin:  Negative for rash.   Neurological:  Negative for dizziness, sensory change, speech change, focal weakness, weakness and headaches.   Endo/Heme/Allergies:  Negative for environmental allergies. Does not bruise/bleed easily.   Psychiatric/Behavioral:  Negative for depression, hallucinations, memory loss, substance abuse and suicidal ideas.  ED Triage Vitals [08/04/22 1203]   BP (Non-Invasive) (!) 179/93   Heart Rate 66   Respiratory Rate 16   Temperature 37.1 C (98.7 F)   SpO2 96 %   Weight 70.3 kg (155 lb)   Height 1.676 m (5' 6"$ )       Physical Exam  Constitutional:       Comments: Chronically ill-appearing female who is hard of hearing sitting upright in bed.  She does not appear to be in acute distress   HENT:      Head: Normocephalic and atraumatic.      Right Ear: External ear normal.      Left Ear: External ear normal.      Nose: Nose normal.   Eyes:      Pupils: Pupils are equal, round, and reactive to light.   Cardiovascular:      Rate and Rhythm: Normal rate and regular rhythm.      Heart sounds: Normal heart sounds.   Pulmonary:      Breath sounds: Normal breath sounds.   Abdominal:      General: Bowel sounds are normal. There is no distension.      Palpations: Abdomen is soft.      Tenderness: There is no abdominal tenderness.   Musculoskeletal:          General: No tenderness or deformity. Normal range of motion.      Cervical back: Normal range of motion and neck supple.   Skin:     General: Skin is warm and dry.      Findings: No rash.   Neurological:      Mental Status: She is alert. Mental status is at baseline.      Cranial Nerves: No cranial nerve deficit.      Deep Tendon Reflexes: Reflexes are normal and symmetric.   Psychiatric:         Mood and Affect: Affect normal.         Cognition and Memory: Memory normal.      Comments: At baseline           Labs:   Results for orders placed or performed during the hospital encounter of 08/04/22 (from the past 12 hour(s))   COVID - 19 SCREENING - Symptomatic - PUI   Result Value Ref Range    SARS-CoV-2 Not Detected Not Detected    INFLUENZA VIRUS TYPE A Not Detected Not Detected    INFLUENZA VIRUS TYPE B Not Detected Not Detected    RESPIRATORY SYNCTIAL VIRUS (RSV) Not Detected Not Detected   COMPREHENSIVE METABOLIC PANEL, NON-FASTING   Result Value Ref Range    SODIUM 142 136 - 145 mmol/L    POTASSIUM 4.2 3.5 - 5.1 mmol/L    CHLORIDE 105 96 - 111 mmol/L    CO2 TOTAL 28 23 - 31 mmol/L    ANION GAP 9 4 - 13 mmol/L    BUN 23 8 - 25 mg/dL    CREATININE 1.05 0.60 - 1.05 mg/dL    BUN/CREA RATIO 22 6 - 22    ALBUMIN 3.8 3.4 - 4.8 g/dL     CALCIUM 10.1 8.6 - 10.3 mg/dL    GLUCOSE 100 65 - 125 mg/dL    ALKALINE PHOSPHATASE 102 55 - 145 U/L    ALT (SGPT) 38 (H) 8 - 22 U/L    AST (SGOT)  37 8 - 45 U/L    BILIRUBIN TOTAL 0.9 0.3 - 1.3 mg/dL  PROTEIN TOTAL 8.2 (H) 6.0 - 8.0 g/dL    ESTIMATED GFR - FEMALE 49 (L) >=60 mL/min/BSA   TROPONIN-I   Result Value Ref Range    TROPONIN-I HS 5.8 <=14.0 ng/L ng/L   CBC WITH DIFF   Result Value Ref Range    WBC 8.7 3.7 - 11.0 x10^3/uL    RBC 5.34 (H) 3.85 - 5.22 x10^6/uL    HGB 13.8 11.5 - 16.0 g/dL    HCT 42.8 34.8 - 46.0 %    MCV 80.1 78.0 - 100.0 fL    MCH 25.8 (L) 26.0 - 32.0 pg    MCHC 32.2 31.0 - 35.5 g/dL    RDW-CV 15.9 (H) 11.5 - 15.5 %    PLATELETS 144 (L) 150 - 400 x10^3/uL     MPV 11.0 8.7 - 12.5 fL    NEUTROPHIL % 75.0 %    LYMPHOCYTE % 18.0 %    MONOCYTE % 6.0 %    EOSINOPHIL % 1.0 %    BASOPHIL % 0.0 %    NEUTROPHIL # 6.49 1.50 - 7.70 x10^3/uL    LYMPHOCYTE # 1.60 1.00 - 4.80 x10^3/uL    MONOCYTE # 0.53 0.20 - 1.10 x10^3/uL    EOSINOPHIL # <0.10 <=0.50 x10^3/uL    BASOPHIL # <0.10 <=0.20 x10^3/uL    IMMATURE GRANULOCYTE % 0.0 0.0 - 1.0 %    IMMATURE GRANULOCYTE # <0.10 <0.10 x10^3/uL   URINALYSIS, MACROSCOPIC   Result Value Ref Range    COLOR Yellow Yellow, Straw    APPEARANCE Clear Clear, Slightly Hazy    SPECIFIC GRAVITY 1.010 1.005 - 1.030    PH 7.0 5.0 - 8.5    LEUKOCYTES Negative Negative WBCs/uL    NITRITE Negative Negative    PROTEIN Negative Negative mg/dL    GLUCOSE Negative Negative mg/dL    KETONES Negative Negative mg/dL    UROBILINOGEN 0.2 0.2 mg/dL    BILIRUBIN Negative Negative mg/dL    BLOOD Negative Negative mg/dL   URINALYSIS, MICROSCOPIC   Result Value Ref Range    RBCS 0-2 None, Occasional, 0-2, 3-5 /hpf    WBCS None None, Occasional, 0-2, 3-5 /hpf    BACTERIA None None /hpf    SQUAMOUS EPITHELIAL 3-5 None, Occasional, 0-2, 3-5 /hpf       I have reviewed all labs ordered.  See course.    Imaging:  CT CERVICAL SPINE WO IV CONTRAST   Final Result by Edi, Radresults In (02/24 1246)      No acute fracture of the cervical spine.      Minimal anterolisthesis C3 on C4, possibly degenerative in etiology.             The CT exam was performed using one or more of the following dose reduction techniques: Automated exposure control, adjustment of the mA and/or kV according to the patient's size, or use of iterative reconstruction technique.               Radiologist location ID: [id number]         CT BRAIN WO IV CONTRAST   Final Result by Edi, Radresults In (02/24 1234)   1. Mild chronic microvascular ischemic change.   2. No acute intracranial process by CT exam.                   The CT exam was performed using one or more of the following dose reduction techniques:  Automated exposure control, adjustment of the mA and/or kV  according to the patient's size, or use of iterative reconstruction technique.               Radiologist location ID: [id number]         XR AP MOBILE CHEST   Final Result by Edi, Radresults In (02/24 1233)   Limited chest. No definite acute pulmonary process                        Radiologist location ID: [id number]             I have seen and reviewed all radiology images ordered. See course.    ED Course/ MDM/ Plan:   Patient was triaged, vital signs were obtained, patient was  placed in a room.  On exam, patient is alert and oriented, nontoxic on exam, and in no acute distress. Vitals were reviewed. Work-up ordered.      Medical Decision Making  Working diagnosis is syncope with collapse.  Differential includes but is not limited to the following:  CVA, acute intracranial hemorrhage, subdural hematoma, arrhythmia, acute coronary syndrome, electrolyte abnormality, infection i.e. UTI or pneumonia.    Problems Addressed:  Syncope and collapse: acute illness or injury     Details: Workup is negative here.  Patient will be admitted for trending of troponin.  Carotid Doppler and an echo.  She will also likely need Holter monitor at home.    Amount and/or Complexity of Data Reviewed  Labs: ordered.  Radiology: ordered. Decision-making details documented in ED Course.  ECG/medicine tests: ordered and independent interpretation performed. Decision-making details documented in ED Course.    Risk  Decision regarding hospitalization.          ED Course as of 08/04/22 1333   Sat Aug 04, 2022   1241 ECG 12-LEAD  Sinus rhythm with a rate of 62 PR of 210 QRS 70 QT of 424 QTC of 430.  No ST segment elevation depression or T-wave inversion.     1326 Normal   1326 CBC/DIFF(!)  Negative   1326 COVID - 19 SCREENING - Symptomatic - PUI  Negative   1326 TROPONIN-I  Normal   1326 URINALYSIS, MACROSCOPIC AND MICROSCOPIC  Negative   1327 CT CERVICAL SPINE WO IV  CONTRAST  IMPRESSION:     No acute fracture of the cervical spine.     Minimal anterolisthesis C3 on C4, possibly degenerative in etiology.        1327 CT BRAIN WO IV CONTRAST  IMPRESSION:  1. Mild chronic microvascular ischemic change.  2. No acute intracranial process by CT exam.        1327 XR AP MOBILE CHEST  IMPRESSION:  Limited chest. No definite acute pulmonary process        1327 XR AP MOBILE CHEST  Negative per my review             Procedures  None        Clinical Impression:   Clinical Impression   Syncope and collapse (Primary)           Disposition: Admitted  New Prescriptions    No medications on file      No follow-up provider specified.   BP (!) 179/93   Pulse 66   Temp 37.1 C (98.7 F)   Resp 16   Ht 1.676 m (5' 6"$ )   Wt 70.3 kg (155 lb)   SpO2 96%   BMI 25.02 kg/m  Birdie Hopes, PA-C       This note was partially created using voice recognition software and is inherently subject to errors including those of syntax and "sound alike " substitutions which may escape proof reading.  In such instances, original meaning may be extrapolated by contextual derivation.

## 2022-08-04 NOTE — Nurses Notes (Signed)
Pt received from ER, report gotten per charge nurse.

## 2022-08-04 NOTE — ED Nurses Note (Signed)
Update Tricia Bailey that patient is being admitted to hospital. Verbalized understanding.

## 2022-08-04 NOTE — Nurses Notes (Signed)
Patient's blood pressure is elevated and patient does not have non-formulary medication bystolic. Notified Doreatha Massed, [place]. He stated he would place order for blood pressure medication.

## 2022-08-04 NOTE — Nurses Notes (Signed)
Lopressor 5 mg, IV administered at this time for elevated B\P as we do not have her non-formulary B\P medication. Pts family aware her Bystolic is needed and willing to bring medication in tomorrow.

## 2022-08-05 LAB — COMPREHENSIVE METABOLIC PANEL, NON-FASTING
ALBUMIN: 3.4 g/dL (ref 3.4–4.8)
ALKALINE PHOSPHATASE: 101 U/L (ref 55–145)
ALT (SGPT): 36 U/L — ABNORMAL HIGH (ref 8–22)
ANION GAP: 8 mmol/L (ref 4–13)
AST (SGOT): 40 U/L (ref 8–45)
BILIRUBIN TOTAL: 0.8 mg/dL (ref 0.3–1.3)
BUN/CREA RATIO: 25 — ABNORMAL HIGH (ref 6–22)
BUN: 30 mg/dL — ABNORMAL HIGH (ref 8–25)
CALCIUM: 9.3 mg/dL (ref 8.6–10.3)
CHLORIDE: 109 mmol/L (ref 96–111)
CO2 TOTAL: 24 mmol/L (ref 23–31)
CREATININE: 1.18 mg/dL — ABNORMAL HIGH (ref 0.60–1.05)
ESTIMATED GFR - FEMALE: 43 mL/min/BSA — ABNORMAL LOW (ref >=60)
GLUCOSE: 91 mg/dL (ref 65–125)
POTASSIUM: 4.4 mmol/L (ref 3.5–5.1)
PROTEIN TOTAL: 7.2 g/dL (ref 6.0–8.0)
SODIUM: 141 mmol/L (ref 136–145)

## 2022-08-05 LAB — CBC WITH DIFF
BASOPHIL #: <0.1 10*3/uL (ref <=0.20)
BASOPHIL %: 1 %
EOSINOPHIL #: <0.1 10*3/uL (ref <=0.50)
EOSINOPHIL %: 1 %
HCT: 39.2 % (ref 34.8–46.0)
HGB: 12.6 g/dL (ref 11.5–16.0)
IMMATURE GRANULOCYTE #: <0.1 10*3/uL (ref <0.10)
IMMATURE GRANULOCYTE %: 0 % (ref 0.0–1.0)
LYMPHOCYTE #: 2.33 10*3/uL (ref 1.00–4.80)
LYMPHOCYTE %: 30 %
MCH: 25.3 pg — ABNORMAL LOW (ref 26.0–32.0)
MCHC: 32.1 g/dL (ref 31.0–35.5)
MCV: 78.6 fL (ref 78.0–100.0)
MONOCYTE #: 0.62 10*3/uL (ref 0.20–1.10)
MONOCYTE %: 8 %
MPV: 11.4 fL (ref 8.7–12.5)
NEUTROPHIL #: 4.69 10*3/uL (ref 1.50–7.70)
NEUTROPHIL %: 60 %
PLATELETS: 140 10*3/uL — ABNORMAL LOW (ref 150–400)
RBC: 4.99 10*6/uL (ref 3.85–5.22)
RDW-CV: 16.1 % — ABNORMAL HIGH (ref 11.5–15.5)
WBC: 7.8 10*3/uL (ref 3.7–11.0)

## 2022-08-05 LAB — TROPONIN-I
TROPONIN-I HS: 6 ng/L (ref <=14.0)
TROPONIN-I HS: 8.9 ng/L (ref <=14.0)

## 2022-08-05 MED ORDER — MECLIZINE 12.5 MG TABLET
12.5000 mg | ORAL_TABLET | Freq: Three times a day (TID) | ORAL | Status: DC | PRN
Start: 2022-08-05 — End: 2022-08-05
  Administered 2022-08-05: 12.5 mg via ORAL
  Filled 2022-08-05: qty 1

## 2022-08-05 MED ORDER — MECLIZINE 12.5 MG TABLET
12.5000 mg | ORAL_TABLET | Freq: Three times a day (TID) | ORAL | 0 refills | Status: AC | PRN
Start: 2022-08-05 — End: ?

## 2022-08-05 NOTE — Care Plan (Signed)
Problem: Adult Inpatient Plan of Care  Goal: Patient-Specific Goal (Individualized)  Outcome: Ongoing (see interventions/notes)  Flowsheets (Taken 08/04/2022 2225)  Individualized Care Needs: safety, telemetry, IV medications  Anxieties, Fears or Concerns: None voiced  Patient-Specific Goals (Include Timeframe): patient to remain free from fall or injury this shift  Plan of Care Reviewed With: patient     Problem: Syncope  Goal: Absence of Syncopal Symptoms  Outcome: Ongoing (see interventions/notes)  Intervention: Manage Effect of Syncopal Symptoms  Recent Flowsheet Documentation  Taken 08/04/2022 2225 by Kathlen Brunswick, LPN  Supportive Measures:   active listening utilized   verbalization of feelings encouraged     Problem: Fall Injury Risk  Goal: Absence of Fall and Fall-Related Injury  Outcome: Ongoing (see interventions/notes)  Intervention: Promote Injury-Free Environment  Recent Flowsheet Documentation  Taken 08/04/2022 2225 by Kathlen Brunswick, LPN  Safety Promotion/Fall Prevention:   activity supervised   fall prevention program maintained   nonskid shoes/slippers when out of bed   safety round/check completed

## 2022-08-05 NOTE — Nurses Notes (Signed)
At patient's bedside to administer bedtime medications and PRN pain medication for pain of 5/10 located in patient's right shoulder. Patient's vitals WDL, patient alert and oriented, no further needs at this time. Call light in reach, bed alarm on.

## 2022-08-05 NOTE — Care Plan (Signed)
Problem: Adult Inpatient Plan of Care  Goal: Plan of Care Review  Outcome: Adequate for Discharge  Goal: Patient-Specific Goal (Individualized)  Outcome: Adequate for Discharge  Goal: Absence of Hospital-Acquired Illness or Injury  Outcome: Adequate for Discharge  Goal: Optimal Comfort and Wellbeing  Outcome: Adequate for Discharge  Goal: Rounds/Family Conference  Outcome: Adequate for Discharge     Problem: Syncope  Goal: Absence of Syncopal Symptoms  Outcome: Adequate for Discharge     Problem: Fall Injury Risk  Goal: Absence of Fall and Fall-Related Injury  Outcome: Adequate for Discharge     Problem: Skin Injury Risk Increased  Goal: Skin Health and Integrity  Outcome: Adequate for Discharge

## 2022-08-05 NOTE — Nurses Notes (Signed)
Patient discharged home with family.  AVS reviewed with patient/care giver.  A written copy of the AVS and discharge instructions was given to the patient/care giver.  Questions sufficiently answered as needed.  Patient/care giver encouraged to follow up with PCP as indicated.  In the event of an emergency, patient/care giver instructed to call 911 or go to the nearest emergency room.

## 2022-08-05 NOTE — Discharge Summary (Signed)
[facility]  DISCHARGE SUMMARY      PATIENT NAME:  Tricia Bailey, Tricia Bailey  MRN:  [id number]  DOB:  07-26-1924    INPATIENT ADMISSION DATE:   DISCHARGE DATE:  08/05/2022    ATTENDING PHYSICIAN: Maxcine Ham, MD  SERVICE: [facility] HOSPITALIST  PRIMARY CARE PHYSICIAN: No Pcp       Reason for Admission       Diagnosis    Syncope and collapse [9926]            DISCHARGE DIAGNOSIS:     Principal Problem:  Syncope and collapse    Active Hospital Problems    Diagnosis Date Noted    Principal Problem: Syncope and collapse [R55] 08/04/2022      Resolved Hospital Problems   No resolved problems to display.     Active Non-Hospital Problems    Diagnosis Date Noted    Acute upper GI bleed 03/29/2022    Vertigo 11/08/2021    GERD (gastroesophageal reflux disease)     HTN (hypertension)       Allergies   Allergen Reactions    Cephalexin  Other Adverse Reaction (Add comment)     unknown    Hctz [Hydrochlorothiazide]  Other Adverse Reaction (Add comment)     unknown    Macrodantin [Nitrofurantoin]  Other Adverse Reaction (Add comment)     unknown    Minocin [Minocycline]  Other Adverse Reaction (Add comment)     unknown            [place] COURSE:    This is a 89 y.o., female who is admitted for syncope and collapse.  Patient presented  the emergency department via EMS for syncopal episodes.  Patient says that she has had multiple syncopal episodes in the last couple of days.  Most recent 1 was today.  She says that she does not have any prodromal symptoms.  She says she will be standing and then the next thing she knows she was on the floor.  She denies any specific injury.  She denies any chest pain shortness on breath palpitations nausea vomiting or diarrhea.  She has not had any recent fevers chills cough or congestion.  She is very hard of hearing so the history is limited.      Workup in the emergency department was negative including CT of the brain CT cervical spine chest x-ray  EKG and labs.  Patient remains asymptomatic.  We will place patient under our service for continued observation serial cardiac enzymes carotid Dopplers and transthoracic echo  Patient is admitted the hospital and telemetry was continued.  There is no evidence of significant arrhythmia noted on telemetry however this was a short period.  I have recommended a seven day extended Holter monitor at discharge.  Patient did have less than 50 percent stenosis on carotid artery duplex.  Patient did have a echo which shows 73 percent EF with mild left ventricular hypertrophy but mild to moderate diastolic dysfunction.  Patient also said she was having little bit of dizziness when she would get up out of bed sometimes even when she would roll over in bed.  I feel this could be from some positional vertigo.  The patient was started on meclizine here.  I would offered to keep the patient additional day for continued monitoring with telemetry however the patient has declined.  She says she wants to go home and her family is coming to pick her up.  DISCHARGE MEDICATIONS:     Current Discharge Medication List        START taking these medications.        Details   meclizine 12.5 mg Tablet  Commonly known as: ANTIVERT   12.5 mg, Oral, EVERY 8 HOURS PRN  Qty: 30 Tablet  Refills: 0            CONTINUE these medications - NO CHANGES were made during your visit.        Details   albuterol sulfate 90 mcg/actuation oral inhaler  Commonly known as: PROVENTIL or VENTOLIN or PROAIR   1-2 Puffs, Inhalation, EVERY 6 HOURS PRN  Refills: 0     amLODIPine 2.5 mg Tablet  Commonly known as: NORVASC   2.5 mg, Oral, NIGHTLY  Refills: 0     clonazePAM 0.5 mg Tablet, Rapid Dissolve  Commonly known as: klonoPIN   0.5 mg, Oral, EVERY 12 HOURS PRN  Qty: 14 Tablet  Refills: 0     ipratropium-albuteroL 0.5 mg-3 mg(2.5 mg base)/3 mL nebulizer solution  Commonly known as: DUONEB   3 mL, Nebulization, EVERY 4 HOURS PRN  Qty: 120 mL  Refills: 0      levothyroxine 25 mcg Tablet  Commonly known as: SYNTHROID   25 mcg, Oral, EVERY MORNING, Unsure of dose  Refills: 0     nebivoloL 10 mg Tablet  Commonly known as: BYSTOLIC   10 mg, Oral, 2 TIMES DAILY  Refills: 0     pantoprazole 40 mg Tablet, Delayed Release (E.C.)  Commonly known as: PROTONIX   40 mg, Oral, DAILY  Qty: 30 Tablet  Refills: 0     promethazine 25 mg Tablet  Commonly known as: PHENERGAN   25 mg, Oral, EVERY 6 HOURS PRN  Refills: 0            STOP taking these medications.      cyclobenzaprine 10 mg Tablet  Commonly known as: FLEXERIL     ferrous sulfate 324 mg (65 mg iron) Tablet, Delayed Release (E.C.)  Commonly known as: FERATAB     HYDROcodone-acetaminophen 5-325 mg Tablet  Commonly known as: NORCO     promethazine-dextromethorphan 6.25-15 mg/5 mL Syrup  Commonly known as: PHENERGAN-DM              Filed Vitals:    08/04/22 2352 08/05/22 0431 08/05/22 0530 08/05/22 0720   BP: (!) 142/55 (!) 170/82 132/82 (!) 157/71   Pulse: 58 62  57   Resp: '16 18  16   '$ Temp: 36.7 C (98 F) 36.4 C (97.5 F)  36.6 C (97.9 F)   SpO2:          Physical Exam  Constitutional:       Comments: Elderly white female lying in bed alert oriented, she is very hard of hearing   HENT:      Head: Normocephalic and atraumatic.      Right Ear: External ear normal.      Left Ear: External ear normal.   Eyes:      Conjunctiva/sclera: Conjunctivae normal.      Pupils: Pupils are equal, round, and reactive to light.   Neck:      Thyroid: No thyromegaly.      Vascular: No JVD.   Cardiovascular:      Rate and Rhythm: Normal rate and regular rhythm.      Heart sounds: Murmur heard.      No friction rub. No gallop.  Comments: Systolic murmur noted  Pulmonary:      Effort: No respiratory distress.      Breath sounds: Normal breath sounds. No wheezing or rales.   Chest:      Chest wall: No tenderness.   Abdominal:      General: Bowel sounds are normal. There is no distension.      Palpations: Abdomen is soft. There is no mass.       Tenderness: There is no abdominal tenderness. There is no guarding or rebound.   Musculoskeletal:         General: No tenderness or deformity. Normal range of motion.      Cervical back: Normal range of motion and neck supple.   Lymphadenopathy:      Cervical: No cervical adenopathy.   Skin:     General: Skin is warm and dry.      Coloration: Skin is not pale.      Findings: No rash.   Neurological:      Mental Status: She is alert and oriented to person, place, and time.      Cranial Nerves: No cranial nerve deficit.      Motor: No abnormal muscle tone.      Coordination: Coordination normal.      Deep Tendon Reflexes: Reflexes are normal and symmetric. Reflexes normal.   Psychiatric:         Mood and Affect: Mood and affect normal.         Cognition and Memory: Memory normal.             Laboratory Data:     Results for orders placed or performed during the hospital encounter of 08/04/22 (from the past 24 hour(s))   COVID - 19 SCREENING - Symptomatic - PUI   Result Value Ref Range    SARS-CoV-2 Not Detected Not Detected    INFLUENZA VIRUS TYPE A Not Detected Not Detected    INFLUENZA VIRUS TYPE B Not Detected Not Detected    RESPIRATORY SYNCTIAL VIRUS (RSV) Not Detected Not Detected   ECG 12-LEAD   Result Value Ref Range    Ventricular rate 62 BPM    Atrial Rate 62 BPM    PR Interval 210 ms    QRS Duration 70 ms    QT Interval 424 ms    QTC Calculation 430 ms    Calculated P Axis 73 degrees    Calculated R Axis -7 degrees    Calculated T Axis 60 degrees   COMPREHENSIVE METABOLIC PANEL, NON-FASTING   Result Value Ref Range    SODIUM 142 136 - 145 mmol/L    POTASSIUM 4.2 3.5 - 5.1 mmol/L    CHLORIDE 105 96 - 111 mmol/L    CO2 TOTAL 28 23 - 31 mmol/L    ANION GAP 9 4 - 13 mmol/L    BUN 23 8 - 25 mg/dL    CREATININE 1.05 0.60 - 1.05 mg/dL    BUN/CREA RATIO 22 6 - 22    ALBUMIN 3.8 3.4 - 4.8 g/dL     CALCIUM 10.1 8.6 - 10.3 mg/dL    GLUCOSE 100 65 - 125 mg/dL    ALKALINE PHOSPHATASE 102 55 - 145 U/L    ALT (SGPT) 38  (H) 8 - 22 U/L    AST (SGOT)  37 8 - 45 U/L    BILIRUBIN TOTAL 0.9 0.3 - 1.3 mg/dL    PROTEIN TOTAL 8.2 (H) 6.0 - 8.0 g/dL  ESTIMATED GFR - FEMALE 49 (L) >=60 mL/min/BSA   TROPONIN-I   Result Value Ref Range    TROPONIN-I HS 5.8 <=14.0 ng/L ng/L   CBC WITH DIFF   Result Value Ref Range    WBC 8.7 3.7 - 11.0 x10^3/uL    RBC 5.34 (H) 3.85 - 5.22 x10^6/uL    HGB 13.8 11.5 - 16.0 g/dL    HCT 42.8 34.8 - 46.0 %    MCV 80.1 78.0 - 100.0 fL    MCH 25.8 (L) 26.0 - 32.0 pg    MCHC 32.2 31.0 - 35.5 g/dL    RDW-CV 15.9 (H) 11.5 - 15.5 %    PLATELETS 144 (L) 150 - 400 x10^3/uL    MPV 11.0 8.7 - 12.5 fL    NEUTROPHIL % 75.0 %    LYMPHOCYTE % 18.0 %    MONOCYTE % 6.0 %    EOSINOPHIL % 1.0 %    BASOPHIL % 0.0 %    NEUTROPHIL # 6.49 1.50 - 7.70 x10^3/uL    LYMPHOCYTE # 1.60 1.00 - 4.80 x10^3/uL    MONOCYTE # 0.53 0.20 - 1.10 x10^3/uL    EOSINOPHIL # <0.10 <=0.50 x10^3/uL    BASOPHIL # <0.10 <=0.20 x10^3/uL    IMMATURE GRANULOCYTE % 0.0 0.0 - 1.0 %    IMMATURE GRANULOCYTE # <0.10 <0.10 x10^3/uL   BLUE TOP TUBE   Result Value Ref Range    RAINBOW/EXTRA TUBE AUTO RESULT Yes    GOLD TOP TUBE   Result Value Ref Range    RAINBOW/EXTRA TUBE AUTO RESULT Yes    GRAY TOP TUBE   Result Value Ref Range    RAINBOW/EXTRA TUBE AUTO RESULT Yes    URINALYSIS, MACROSCOPIC   Result Value Ref Range    COLOR Yellow Yellow, Straw    APPEARANCE Clear Clear, Slightly Hazy    SPECIFIC GRAVITY 1.010 1.005 - 1.030    PH 7.0 5.0 - 8.5    LEUKOCYTES Negative Negative WBCs/uL    NITRITE Negative Negative    PROTEIN Negative Negative mg/dL    GLUCOSE Negative Negative mg/dL    KETONES Negative Negative mg/dL    UROBILINOGEN 0.2 0.2 mg/dL    BILIRUBIN Negative Negative mg/dL    BLOOD Negative Negative mg/dL   URINALYSIS, MICROSCOPIC   Result Value Ref Range    RBCS 0-2 None, Occasional, 0-2, 3-5 /hpf    WBCS None None, Occasional, 0-2, 3-5 /hpf    BACTERIA None None /hpf    SQUAMOUS EPITHELIAL 3-5 None, Occasional, 0-2, 3-5 /hpf   TROPONIN-I   Result Value Ref  Range    TROPONIN-I HS 5.8 <=14.0 ng/L ng/L   CAROTID ARTERY DUPLEX   Result Value Ref Range    Right CCA prox sys 56.1 cm/s    Right CCA prox dias 6.8 cm/s    Right CCA mid sys 54.9     Left CCA mid dias 6.4     Right cca dist sys 48.9 cm/s    Right CCA dist dias 8.8 cm/s    Right Carotid Bulb Sys 59.7     Right Carotid Bulb Dias 9.2     Right ICA prox sys 41.7 cm/s    Right ICA prox dias 8 cm/s    Right ICA mid sys 99 cm/s    Right ICA mid dias 18.6 cm/s    Right ICA dist sys 39.6 cm/s    Right ICA dist dias 6.5 cm/s    Right eca sys 57.3  cm/s    Right vertebral sys 30.5 cm/s    Right ICA/CCA sys 2.02 cm/s    Left CCA prox sys 49.7 cm/s    Left CCA prox dias 2.9 cm/s    Left CCA mid sys 45.9     Left CCA mid dias 4.5     Left CCA dist sys 36.9 cm/s    Left CCA dist dias 4.5 cm/s    Left Carotid Bubl Sys 54.2     Left Carotid Bulb Dias 7.1     Left ICA prox sys 41.1 cm/s    Left ICA prox dias 5.5 cm/s    Left ICA mid sys 65 cm/s    Left ICA mid dias 11.6 cm/s    Left ICA dist sys 76.1 cm/s    Left ICA dist dias 14.9 cm/s    Left ECA sys 67.4 cm/s    Left vertebral sys 36.9 cm/s    Left ICA/CCA sys 2.06    TROPONIN-I   Result Value Ref Range    TROPONIN-I HS 8.9 <=14.0 ng/L ng/L   COMPREHENSIVE METABOLIC PANEL, NON-FASTING   Result Value Ref Range    SODIUM 141 136 - 145 mmol/L    POTASSIUM 4.4 3.5 - 5.1 mmol/L    CHLORIDE 109 96 - 111 mmol/L    CO2 TOTAL 24 23 - 31 mmol/L    ANION GAP 8 4 - 13 mmol/L    BUN 30 (H) 8 - 25 mg/dL    CREATININE 1.18 (H) 0.60 - 1.05 mg/dL    BUN/CREA RATIO 25 (H) 6 - 22    ALBUMIN 3.4 3.4 - 4.8 g/dL     CALCIUM 9.3 8.6 - 10.3 mg/dL    GLUCOSE 91 65 - 125 mg/dL    ALKALINE PHOSPHATASE 101 55 - 145 U/L    ALT (SGPT) 36 (H) 8 - 22 U/L    AST (SGOT)  40 8 - 45 U/L    BILIRUBIN TOTAL 0.8 0.3 - 1.3 mg/dL    PROTEIN TOTAL 7.2 6.0 - 8.0 g/dL    ESTIMATED GFR - FEMALE 43 (L) >=60 mL/min/BSA   CBC WITH DIFF   Result Value Ref Range    WBC 7.8 3.7 - 11.0 x10^3/uL    RBC 4.99 3.85 - 5.22 x10^6/uL     HGB 12.6 11.5 - 16.0 g/dL    HCT 39.2 34.8 - 46.0 %    MCV 78.6 78.0 - 100.0 fL    MCH 25.3 (L) 26.0 - 32.0 pg    MCHC 32.1 31.0 - 35.5 g/dL    RDW-CV 16.1 (H) 11.5 - 15.5 %    PLATELETS 140 (L) 150 - 400 x10^3/uL    MPV 11.4 8.7 - 12.5 fL    NEUTROPHIL % 60.0 %    LYMPHOCYTE % 30.0 %    MONOCYTE % 8.0 %    EOSINOPHIL % 1.0 %    BASOPHIL % 1.0 %    NEUTROPHIL # 4.69 1.50 - 7.70 x10^3/uL    LYMPHOCYTE # 2.33 1.00 - 4.80 x10^3/uL    MONOCYTE # 0.62 0.20 - 1.10 x10^3/uL    EOSINOPHIL # <0.10 <=0.50 x10^3/uL    BASOPHIL # <0.10 <=0.20 x10^3/uL    IMMATURE GRANULOCYTE % 0.0 0.0 - 1.0 %    IMMATURE GRANULOCYTE # <0.10 <0.10 x10^3/uL       Imaging Studies:    CT CERVICAL SPINE WO IV CONTRAST   Final Result by Edi, Radresults In (02/24 1246)  No acute fracture of the cervical spine.      Minimal anterolisthesis C3 on C4, possibly degenerative in etiology.             The CT exam was performed using one or more of the following dose reduction techniques: Automated exposure control, adjustment of the mA and/or kV according to the patient's size, or use of iterative reconstruction technique.               Radiologist location ID: [id number]         CT BRAIN WO IV CONTRAST   Final Result by Edi, Radresults In (02/24 1234)   1. Mild chronic microvascular ischemic change.   2. No acute intracranial process by CT exam.                   The CT exam was performed using one or more of the following dose reduction techniques: Automated exposure control, adjustment of the mA and/or kV according to the patient's size, or use of iterative reconstruction technique.               Radiologist location ID: [id number]         XR AP MOBILE CHEST   Final Result by Edi, Radresults In (02/24 1233)   Limited chest. No definite acute pulmonary process                        Radiologist location ID: [id number]                 DISCHARGE INSTRUCTIONS:       [place] DIET     Diet: RESUME HOME DIET       DISCHARGE INSTRUCTION - ACTIVITY     Activity: AS TOLERATED      7 DAY EXTENDED HOLTER MONITOR           Pediatric or Adult Adult    Reason for Exam Syncope and collapse [780.2.ICD-9-CM]    Does the patient have any of the following clinical protocols: chronic afib, post TAVR, or post ablation? No             Advance Directive Information      Flowsheet Row Most Recent Value   Does the Patient have an Advance Directive? Yes, Patient Does Have Advance Directive for Healthcare Treatment            CONDITION ON DISCHARGE: Alert, Oriented, and VS Stable    DISCHARGE DISPOSITION:  Home discharge     Maxcine Ham, MD    Copies sent to Care Team         Relationship Specialty Notifications Start End    Pcp, No PCP - General   06/13/22               Referring providers can utilize [email or web address] to access their referred [place] patient's information.

## 2022-08-05 NOTE — Discharge Instructions (Signed)
Review discharge instructions.

## 2022-08-20 ENCOUNTER — Ambulatory Visit (INDEPENDENT_AMBULATORY_CARE_PROVIDER_SITE_OTHER): Payer: Self-pay

## 2022-08-28 ENCOUNTER — Other Ambulatory Visit: Payer: Self-pay

## 2022-08-28 ENCOUNTER — Ambulatory Visit (INDEPENDENT_AMBULATORY_CARE_PROVIDER_SITE_OTHER): Payer: Medicare PPO

## 2023-01-16 ENCOUNTER — Ambulatory Visit: Payer: Medicare PPO | Attending: Physician Assistant | Admitting: Physician Assistant

## 2023-01-16 ENCOUNTER — Other Ambulatory Visit: Payer: Self-pay

## 2023-01-16 ENCOUNTER — Encounter (HOSPITAL_COMMUNITY): Payer: Self-pay | Admitting: Family Medicine

## 2023-01-16 ENCOUNTER — Observation Stay
Admission: EM | Admit: 2023-01-16 | Discharge: 2023-01-17 | Disposition: A | Discharge status: Home / Self Care | Payer: Medicare PPO | Attending: Family Medicine | Admitting: Family Medicine

## 2023-01-16 ENCOUNTER — Emergency Department (HOSPITAL_COMMUNITY): Payer: Medicare PPO

## 2023-01-16 DIAGNOSIS — K219 Gastro-esophageal reflux disease without esophagitis: Secondary | ICD-10-CM | POA: Insufficient documentation

## 2023-01-16 DIAGNOSIS — I129 Hypertensive chronic kidney disease with stage 1 through stage 4 chronic kidney disease, or unspecified chronic kidney disease: Secondary | ICD-10-CM | POA: Insufficient documentation

## 2023-01-16 DIAGNOSIS — J9601 Acute respiratory failure with hypoxia: Secondary | ICD-10-CM | POA: Insufficient documentation

## 2023-01-16 DIAGNOSIS — U071 COVID-19: Principal | ICD-10-CM | POA: Insufficient documentation

## 2023-01-16 DIAGNOSIS — N1831 Chronic kidney disease, stage 3a: Secondary | ICD-10-CM | POA: Insufficient documentation

## 2023-01-16 DIAGNOSIS — J449 Chronic obstructive pulmonary disease, unspecified: Secondary | ICD-10-CM | POA: Insufficient documentation

## 2023-01-16 DIAGNOSIS — I10 Essential (primary) hypertension: Secondary | ICD-10-CM | POA: Diagnosis present

## 2023-01-16 DIAGNOSIS — R0902 Hypoxemia: Principal | ICD-10-CM

## 2023-01-16 DIAGNOSIS — H9190 Unspecified hearing loss, unspecified ear: Secondary | ICD-10-CM | POA: Insufficient documentation

## 2023-01-16 LAB — CBC WITH DIFF
BASOPHIL #: <0.1 10*3/uL (ref <=0.20)
BASOPHIL %: 1 %
EOSINOPHIL #: <0.1 10*3/uL (ref <=0.50)
EOSINOPHIL %: 1 %
HCT: 36.9 % (ref 34.8–46.0)
HGB: 11.5 g/dL (ref 11.5–16.0)
IMMATURE GRANULOCYTE #: <0.1 10*3/uL (ref <0.10)
IMMATURE GRANULOCYTE %: 0 % (ref 0.0–1.0)
LYMPHOCYTE #: 1.31 10*3/uL (ref 1.00–4.80)
LYMPHOCYTE %: 28 %
MCH: 25.7 pg — ABNORMAL LOW (ref 26.0–32.0)
MCHC: 31.2 g/dL (ref 31.0–35.5)
MCV: 82.6 fL (ref 78.0–100.0)
MONOCYTE #: 0.54 10*3/uL (ref 0.20–1.10)
MONOCYTE %: 11 %
NEUTROPHIL #: 2.85 10*3/uL (ref 1.50–7.70)
NEUTROPHIL %: 59 %
PLATELETS: 117 10*3/uL — ABNORMAL LOW (ref 150–400)
RBC: 4.47 10*6/uL (ref 3.85–5.22)
RDW-CV: 16.3 % — ABNORMAL HIGH (ref 11.5–15.5)
WBC: 4.8 10*3/uL (ref 3.7–11.0)

## 2023-01-16 LAB — BLOOD GAS
%FIO2 (VENOUS): 21 %
BASE EXCESS: 1.5 mmol/L (ref 0.0–3.0)
BICARBONATE (VENOUS): 26.1 mmol/L (ref 22.0–29.0)
O2 SATURATION (VENOUS): 98 %
PCO2 (VENOUS): 46 mm/Hg (ref 41–51)
PH (VENOUS): 7.38 (ref 7.32–7.43)
PO2 (VENOUS): 107 mm/Hg

## 2023-01-16 LAB — COMPREHENSIVE METABOLIC PANEL, NON-FASTING
ALBUMIN: 2.9 g/dL — ABNORMAL LOW (ref 3.4–4.8)
ALKALINE PHOSPHATASE: 120 U/L (ref 55–145)
ALT (SGPT): 14 U/L (ref <31)
ANION GAP: 10 mmol/L (ref 4–13)
AST (SGOT): 33 U/L (ref 11–34)
BILIRUBIN TOTAL: 0.5 mg/dL (ref 0.3–1.3)
BUN/CREA RATIO: 14 (ref 6–22)
BUN: 16 mg/dL (ref 8–25)
CALCIUM: 8.8 mg/dL (ref 8.6–10.3)
CHLORIDE: 107 mmol/L (ref 96–111)
CO2 TOTAL: 25 mmol/L (ref 23–31)
CREATININE: 1.12 mg/dL — ABNORMAL HIGH (ref 0.60–1.05)
ESTIMATED GFR - FEMALE: 45 mL/min/BSA — ABNORMAL LOW (ref >=60)
GLUCOSE: 103 mg/dL (ref 65–125)
POTASSIUM: 3.7 mmol/L (ref 3.5–5.1)
PROTEIN TOTAL: 6.5 g/dL (ref 6.0–8.0)
SODIUM: 142 mmol/L (ref 136–145)

## 2023-01-16 LAB — URINALYSIS, MACROSCOPIC
BLOOD: NEGATIVE mg/dL
GLUCOSE: NEGATIVE mg/dL
KETONES: NEGATIVE mg/dL
LEUKOCYTES: NEGATIVE WBCs/uL
NITRITE: NEGATIVE
PH: 5.5 (ref 5.0–8.5)
PROTEIN: 30 mg/dL — AB
SPECIFIC GRAVITY: >=1.03 (ref 1.005–1.030)
UROBILINOGEN: 0.2 mg/dL

## 2023-01-16 LAB — BNP, LAB ISTAT: BNP, POC: 622 — ABNORMAL HIGH (ref <=100)

## 2023-01-16 LAB — COVID-19 ~~LOC~~ MOLECULAR LAB TESTING
INFLUENZA VIRUS TYPE A: NOT DETECTED
INFLUENZA VIRUS TYPE B: NOT DETECTED
RESPIRATORY SYNCYTIAL VIRUS (RSV): NOT DETECTED
SARS-CoV-2: DETECTED — AB

## 2023-01-16 LAB — URINALYSIS, MICROSCOPIC

## 2023-01-16 LAB — BLUE TOP TUBE

## 2023-01-16 LAB — LACTIC ACID LEVEL W/ REFLEX FOR LEVEL >2.0: LACTIC ACID: 1.8 mmol/L (ref 0.5–2.2)

## 2023-01-16 LAB — MORPHOLOGY: RBC MORPHOLOGY: NORMAL

## 2023-01-16 LAB — GOLD TOP TUBE

## 2023-01-16 MED ORDER — MAGNESIUM HYDROXIDE 400 MG/5 ML ORAL SUSPENSION
15.0000 mL | Freq: Every day | ORAL | Status: DC | PRN
Start: 2023-01-16 — End: 2023-01-17

## 2023-01-16 MED ORDER — SODIUM CHLORIDE 0.9 % (FLUSH) INJECTION SYRINGE
10.0000 mL | INJECTION | INTRAMUSCULAR | Status: DC | PRN
Start: 2023-01-16 — End: 2023-01-17

## 2023-01-16 MED ORDER — ALBUTEROL SULFATE 2.5 MG/3 ML (0.083 %) SOLUTION FOR NEBULIZATION
2.5000 mg | INHALATION_SOLUTION | Freq: Four times a day (QID) | RESPIRATORY_TRACT | Status: DC
Start: 2023-01-16 — End: 2023-01-17
  Administered 2023-01-16 – 2023-01-17 (×2): 2.5 mg via RESPIRATORY_TRACT
  Filled 2023-01-16 (×2): qty 3

## 2023-01-16 MED ORDER — MECLIZINE 12.5 MG TABLET
12.5000 mg | ORAL_TABLET | Freq: Three times a day (TID) | ORAL | Status: DC | PRN
Start: 2023-01-16 — End: 2023-01-17

## 2023-01-16 MED ORDER — CLONAZEPAM 1 MG TABLET
0.5000 mg | ORAL_TABLET | Freq: Two times a day (BID) | ORAL | Status: DC | PRN
Start: 2023-01-16 — End: 2023-01-17

## 2023-01-16 MED ORDER — METHYLPREDNISOLONE SOD SUCC 125 MG SOLUTION FOR INJECTION WRAPPER
125.0000 mg | INTRAVENOUS | Status: AC
Start: 2023-01-16 — End: 2023-01-16
  Administered 2023-01-16: 125 mg via INTRAVENOUS
  Filled 2023-01-16: qty 2

## 2023-01-16 MED ORDER — SODIUM CHLORIDE 0.9 % (FLUSH) INJECTION SYRINGE
10.0000 mL | INJECTION | Freq: Three times a day (TID) | INTRAMUSCULAR | Status: DC
Start: 2023-01-16 — End: 2023-01-17
  Administered 2023-01-16 – 2023-01-17 (×3): 0 mL

## 2023-01-16 MED ORDER — ONDANSETRON HCL (PF) 4 MG/2 ML INJECTION SOLUTION
4.0000 mg | Freq: Four times a day (QID) | INTRAMUSCULAR | Status: DC | PRN
Start: 2023-01-16 — End: 2023-01-17

## 2023-01-16 MED ORDER — ACETAMINOPHEN 325 MG TABLET
650.0000 mg | ORAL_TABLET | ORAL | Status: DC | PRN
Start: 2023-01-16 — End: 2023-01-17

## 2023-01-16 MED ORDER — ALBUTEROL SULFATE 2.5 MG/3 ML (0.083 %) SOLUTION FOR NEBULIZATION
2.5000 mg | INHALATION_SOLUTION | RESPIRATORY_TRACT | Status: DC | PRN
Start: 2023-01-16 — End: 2023-01-17

## 2023-01-16 MED ORDER — PANTOPRAZOLE 40 MG TABLET,DELAYED RELEASE
40.0000 mg | DELAYED_RELEASE_TABLET | Freq: Every day | ORAL | Status: DC
Start: 2023-01-16 — End: 2023-01-17
  Administered 2023-01-16: 0 mg via ORAL
  Administered 2023-01-17: 40 mg via ORAL
  Filled 2023-01-16: qty 1

## 2023-01-16 MED ORDER — ENOXAPARIN 30 MG/0.3 ML SUBCUTANEOUS SYRINGE
30.0000 mg | INJECTION | SUBCUTANEOUS | Status: DC
Start: 2023-01-16 — End: 2023-01-17
  Administered 2023-01-16: 30 mg via SUBCUTANEOUS
  Filled 2023-01-16: qty 0.3

## 2023-01-16 MED ORDER — SODIUM CHLORIDE 0.9 % INTRAVENOUS SOLUTION
INTRAVENOUS | Status: DC
Start: 2023-01-16 — End: 2023-01-17

## 2023-01-16 MED ORDER — ALUMINUM-MAG HYDROXIDE-SIMETHICONE 200 MG-200 MG-20 MG/5 ML ORAL SUSP
20.0000 mL | ORAL | Status: DC | PRN
Start: 2023-01-16 — End: 2023-01-17

## 2023-01-16 MED ORDER — LEVOTHYROXINE 25 MCG TABLET
25.0000 ug | ORAL_TABLET | Freq: Every morning | ORAL | Status: DC
Start: 2023-01-17 — End: 2023-01-17
  Administered 2023-01-17: 25 ug via ORAL
  Filled 2023-01-16: qty 1

## 2023-01-16 MED ORDER — AMLODIPINE 5 MG TABLET
2.5000 mg | ORAL_TABLET | Freq: Every evening | ORAL | Status: DC
Start: 2023-01-16 — End: 2023-01-17
  Administered 2023-01-16: 2.5 mg via ORAL
  Filled 2023-01-16: qty 1

## 2023-01-16 MED ORDER — IPRATROPIUM 0.5 MG-ALBUTEROL 3 MG (2.5 MG BASE)/3 ML NEBULIZATION SOLN
3.0000 mL | INHALATION_SOLUTION | RESPIRATORY_TRACT | Status: AC
Start: 2023-01-16 — End: 2023-01-16
  Administered 2023-01-16: 3 mL via RESPIRATORY_TRACT
  Filled 2023-01-16: qty 3

## 2023-01-16 MED ORDER — ENOXAPARIN 30 MG/0.3 ML SUBCUTANEOUS SYRINGE
30.0000 mg | INJECTION | SUBCUTANEOUS | Status: DC
Start: 2023-01-16 — End: 2023-01-16

## 2023-01-16 NOTE — ED Nurses Note (Signed)
Second set of blood cultures collected per lab. Covid swab collected. Meds as per orders. RT at bedside for neb treatment.

## 2023-01-16 NOTE — Nurses Notes (Signed)
Bed request received, will assign when bed becomes available.   Sydnee Levans, RN

## 2023-01-16 NOTE — ED Triage Notes (Signed)
Per ems called out for SOB and ems states deminished lung sounds . One dueo nebs given in route. Poor hx given. Pt on 5 l nc sats of100%. 18rf pta. Pt has skin tear to left forearm. Family states pt having difficulty breathing , no s/s of breathing. Per EMS the entire family has covid and pt has a hearing device in a bag but dont know how to use it.

## 2023-01-16 NOTE — Nurses Notes (Signed)
Patient arrived from  ER via stretcher for COVID. She is awake and alert, able to state name and that she is in the hospital with COVID. Very difficult to understand patient and communicate due to difficulty hearing. Headphones and speaking device present to assist patient with hearing. Explained POC though no evidence that learning occurred. Resp therapy at bedside for assessment. Called patient's niece/caregiver Para March to discuss admission and obtain history. Caregiver states that patient falls all the time. Bruising noted to buttocks, bilateral shins and knees - different stages of healing. Scabs and reddened areas to lower legs as well. Bed alarm set and call bell given and explained. Unsure if patient comprehends use. She is located near nurse's station with constant visual possible.

## 2023-01-16 NOTE — ED Provider Notes (Signed)
Emergency Department  [facility]   01/16/2023     Tricia Bailey  11/22/1924  89 y.o.  female  Tricia Bailey [place] [address]   [phone] (home)  PCP: No Pcp   Date of service:01/16/2023 12:39    Chief Complaint:   Chief Complaint   Patient presents with   . Shortness of Breath           HPI: This is a 89 y.o. female with history of chronic obstructive pulmonary disease and hypertension who presents to the emergency department via EMS with complaint of shortness of breath.  Patient is very hard of hearing and the majority of the history has come from EMS and nursing staff.  Patient does state that she has had a productive cough of clear sputum.  Per EMS, entire family is COVID positive.  Patient is unaware of any fevers.  She denies nausea or vomiting.  She has had some diarrhea.  She was given a DuoNeb in route by EMS.  Patient does not use supplemental oxygen at home.            Past Medical History:   Past Medical History:   Diagnosis Date   . Arthropathy    . COPD (chronic obstructive pulmonary disease) (CMS HCC)    . GERD (gastroesophageal reflux disease)    . HTN (hypertension)      (Not in an outpatient encounter)     Past Surgical History: No past surgical history on file.    Social History:   Social History     Tobacco Use   . Smoking status: Never   . Smokeless tobacco: Never   Vaping Use   . Vaping status: Never Used   Substance Use Topics   . Alcohol use: Never   . Drug use: Never        Family History:  No family history on file.        Medications Prior to Admission       Prescriptions    albuterol sulfate (PROVENTIL OR VENTOLIN OR PROAIR) 90 mcg/actuation Inhalation HFA Aerosol Inhaler    Take 1-2 Puffs by inhalation Every 6 hours as needed    amLODIPine (NORVASC) 2.5 mg Oral Tablet    Take 1 Tablet (2.5 mg total) by mouth Every night    clonazePAM (KLONOPIN) 0.5 mg Oral Tablet, Rapid Dissolve    Take 1 Tablet (0.5 mg total) by mouth Every 12 hours as needed for Anxiety or  Other (dizziness)    ipratropium-albuterol 0.5 mg-3 mg(2.5 mg base)/3 mL Solution for Nebulization    Take 3 mL by nebulization Every 4 hours as needed for Wheezing    levothyroxine (SYNTHROID) 25 mcg Oral Tablet    Take 1 Tablet (25 mcg total) by mouth Every morning Unsure of dose    meclizine (ANTIVERT) 12.5 mg Oral Tablet    Take 1 Tablet (12.5 mg total) by mouth Every 8 hours as needed for Dizziness    nebivoloL (BYSTOLIC) 10 mg Oral Tablet    Take 1 Tablet (10 mg total) by mouth Twice daily    pantoprazole (PROTONIX) 40 mg Oral Tablet, Delayed Release (E.C.)    Take 1 Tablet (40 mg total) by mouth Once a day    promethazine (PHENERGAN) 25 mg Oral Tablet    Take 1 Tablet (25 mg total) by mouth Every 6 hours as needed for Nausea/Vomiting            Allergies:   Allergies  Allergen Reactions   . Cephalexin  Other Adverse Reaction (Add comment)     unknown   . Hctz [Hydrochlorothiazide]  Other Adverse Reaction (Add comment)     unknown   . Macrodantin [Nitrofurantoin]  Other Adverse Reaction (Add comment)     unknown   . Minocin [Minocycline]  Other Adverse Reaction (Add comment)     unknown         Above history reviewed with patient.  Allergies, medication list, reviewed.        Physical exam:  Physical Exam  Body mass index is 23.11 kg/m.  ED Triage Vitals [01/16/23 1223]   BP (Non-Invasive) 135/88   Heart Rate 72   Respiratory Rate 16   Temperature 37.1 C (98.7 F)   SpO2 100 %   Weight 68.9 kg (152 lb)   Height        Constitutional: patient is oriented to person, place, and time and well-developed, well-nourished, and in no distress.   HENT:   Head: Normocephalic and atraumatic.   Right Ear: External ear normal.   Left Ear: External ear normal.   Nose:  Mask in place.  Mouth/Throat:  Mask in place.  Eyes: Pupils are equal, round, and reactive to light. Conjunctivae and EOM are normal.   Neck: Normal range of motion. Neck supple.   Cardiovascular: Normal rate, regular rhythm, normal heart sounds and intact  distal pulses.   Pulmonary/Chest: Effort normal and breath sounds diminished throughout with few scattered wheezes.  Abdominal: Soft. Bowel sounds are normal.   Musculoskeletal: Normal range of motion.   Neurological:Patient is alert and oriented to person, place, and time.  . GCS score is 15.   Skin: Skin is warm and dry.   Psychiatric: Mood, memory, affect and judgment normal.      The following orders were placed after examining the patient :  Orders Placed This Encounter   . ADULT ROUTINE BLOOD CULTURE, SET OF 2 ADULT BOTTLES (BACTERIA AND YEAST)   . ADULT ROUTINE BLOOD CULTURE, SET OF 2 ADULT BOTTLES (BACTERIA AND YEAST)   . XR AP MOBILE CHEST   . CBC/DIFF   . COMPREHENSIVE METABOLIC PANEL, NON-FASTING   . LACTIC ACID LEVEL W/ REFLEX FOR LEVEL >2.0   . CANCELED: B-TYPE NATRIURETIC PEPTIDE   . BLOOD GAS Venous   . COVID - 19 SCREENING - Symptomatic - PUI   . URINALYSIS, MACROSCOPIC AND MICROSCOPIC   . CBC WITH DIFF   . URINALYSIS, MACROSCOPIC   . URINALYSIS, MICROSCOPIC   . BNP, LAB ISTAT   . EXTRA TUBES   . BLUE TOP TUBE   . GOLD TOP TUBE   . MORPHOLOGY   . RESP THERAPY INSTRUCT/EDUCATE   . ipratropium-albuterol 0.5 mg-3 mg(2.5 mg base)/3 mL Solution for Nebulization   . methylPREDNISolone sod succ (SOLU-medrol) 125 mg/2 mL injection      XR AP MOBILE CHEST   Final Result   No acute cardiopulmonary process.         JRM               Radiologist location ID: [id number]            Results for orders placed or performed during the hospital encounter of 01/16/23 (from the past 12 hour(s))   COMPREHENSIVE METABOLIC PANEL, NON-FASTING   Result Value Ref Range    SODIUM 142 136 - 145 mmol/L    POTASSIUM 3.7 3.5 - 5.1 mmol/L    CHLORIDE 107 96 -  111 mmol/L    CO2 TOTAL 25 23 - 31 mmol/L    ANION GAP 10 4 - 13 mmol/L    BUN 16 8 - 25 mg/dL    CREATININE 4.78 (H) 0.60 - 1.05 mg/dL    BUN/CREA RATIO 14 6 - 22    ALBUMIN 2.9 (L) 3.4 - 4.8 g/dL     CALCIUM 8.8 8.6 - 29.5 mg/dL    GLUCOSE 621 65 - 308 mg/dL    ALKALINE  PHOSPHATASE 120 55 - 145 U/L    ALT (SGPT) 14 <31 U/L    AST (SGOT)  33 11 - 34 U/L    BILIRUBIN TOTAL 0.5 0.3 - 1.3 mg/dL    PROTEIN TOTAL 6.5 6.0 - 8.0 g/dL    ESTIMATED GFR - FEMALE 45 (L) >=60 mL/min/BSA   BNP, LAB ISTAT   Result Value Ref Range    BNP, POC 622 (H) <=100   LACTIC ACID LEVEL W/ REFLEX FOR LEVEL >2.0   Result Value Ref Range    LACTIC ACID 1.8 0.5 - 2.2 mmol/L   BLOOD GAS Venous   Result Value Ref Range    %FIO2 (VENOUS) 21.0 %    PH (VENOUS) 7.38 7.32 - 7.43    PCO2 (VENOUS) 46 41 - 51 mm/Hg    PO2 (VENOUS) 107 mm/Hg    BICARBONATE (VENOUS) 26.1 22.0 - 29.0 mmol/L    BASE EXCESS 1.5 0.0 - 3.0 mmol/L    O2 SATURATION (VENOUS) 98.0 %   CBC WITH DIFF   Result Value Ref Range    WBC 4.8 3.7 - 11.0 x10^3/uL    RBC 4.47 3.85 - 5.22 x10^6/uL    HGB 11.5 11.5 - 16.0 g/dL    HCT 65.7 84.6 - 96.2 %    MCV 82.6 78.0 - 100.0 fL    MCH 25.7 (L) 26.0 - 32.0 pg    MCHC 31.2 31.0 - 35.5 g/dL    RDW-CV 95.2 (H) 84.1 - 15.5 %    PLATELETS 117 (L) 150 - 400 x10^3/uL    NEUTROPHIL % 59.0 %    LYMPHOCYTE % 28.0 %    MONOCYTE % 11.0 %    EOSINOPHIL % 1.0 %    BASOPHIL % 1.0 %    NEUTROPHIL # 2.85 1.50 - 7.70 x10^3/uL    LYMPHOCYTE # 1.31 1.00 - 4.80 x10^3/uL    MONOCYTE # 0.54 0.20 - 1.10 x10^3/uL    EOSINOPHIL # <0.10 <=0.50 x10^3/uL    BASOPHIL # <0.10 <=0.20 x10^3/uL    IMMATURE GRANULOCYTE % 0.0 0.0 - 1.0 %    IMMATURE GRANULOCYTE # <0.10 <0.10 x10^3/uL   MORPHOLOGY   Result Value Ref Range    RBC MORPHOLOGY Normal RBC and PLT Morphology    COVID - 19 SCREENING - Symptomatic - PUI   Result Value Ref Range    SARS-CoV-2 Detected (A) Not Detected    INFLUENZA VIRUS TYPE A Not Detected Not Detected    INFLUENZA VIRUS TYPE B Not Detected Not Detected    RESPIRATORY SYNCTIAL VIRUS (RSV) Not Detected Not Detected         ED Course:   ED Course as of 01/16/23 1645   Wed Jan 16, 2023   1319 BLOOD GAS Venous  Normal VBG   1320 XR AP MOBILE CHEST  No acute cardiopulmonary process.   1326 LACTIC ACID LEVEL W/ REFLEX FOR  LEVEL >2.0  Normal lactic acid   1349 BNP, LAB ISTAT(!)  622  1349 COMPREHENSIVE METABOLIC PANEL, NON-FASTING(!)  Elevated creatinine of 1.12 with reduced EGFR of 45%.  This is patient's baseline.   1416 CBC/DIFF(!)  Unremarkable CBC   1431 COVID - 19 SCREENING - Symptomatic - PUI(!)  Positive COVID   1645 URINALYSIS, MACROSCOPIC AND MICROSCOPIC(!)  Small bilirubin, 6-10 wbc's, few bacteria.  16-20 squamous epithelial cells.  No urinary tract infection, contaminated sample.      Medications Administered in the ED   ipratropium-albuterol 0.5 mg-3 mg(2.5 mg base)/3 mL Solution for Nebulization (3 mL Nebulization Given 01/16/23 1326)   methylPREDNISolone sod succ (SOLU-medrol) 125 mg/2 mL injection (125 mg Intravenous Given 01/16/23 1317)               Emergency Department Procedure:    EKG Interpertation:     Procedures    Medical Decision Making  Differential diagnosis may include COVID, flu, RSV, pneumonia, bronchitis, viral upper respiratory infection, CHF    Patient is afebrile.  She is not hypotensive or tachycardic.  Patient has been hypoxic with an O2 requirement.  She is not typically on supplemental oxygen at home, but has required 2 L via nasal cannula.  Patient is positive for COVID, however the remainder of her laboratory findings are at her baseline.  There is no evidence of pneumonia seen on chest x-ray.  Patient's symptoms began 4-5 days ago and may not be a candidate for Paxlovid.  Recommend that patient be admitted due to being hypoxic with COVID and her advanced age.  Patient is at risk for decompensation.  Patient does have a slightly elevated BNP, but clinically does not have congestive heart failure.  Discussed patient with hospitalist and she will be admitted to their services.    Amount and/or Complexity of Data Reviewed  Labs: ordered. Decision-making details documented in ED Course.  Radiology: ordered and independent interpretation performed. Decision-making details documented in ED  Course.    Risk  Prescription drug management.  Decision regarding hospitalization.            Findings and diagnosis discussed with patient.  Clinical Impression:   Clinical Impression   Hypoxia (Primary)   COVID   Stage 3a chronic kidney disease (CMS HCC)         Disposition: Admitted          No follow-ups on file.   New Prescriptions    No medications on file      No follow-up provider specified.    Future Appointments  No future appointments.       BP 138/64   Pulse 68   Temp 37.1 C (98.7 F)   Resp (!) 24   Wt 68.9 kg (152 lb)   SpO2 (!) 89%   BMI 23.11 kg/m        Marylee Floras, PA-C8/12/2022              This note was partially created using voice recognition software and is inherently subject to errors including those of syntax and "sound alike " substitutions which may escape proof reading.  In such instances, original meaning may be extrapolated by contextual derivation.

## 2023-01-16 NOTE — ED Nurses Note (Signed)
Bed not ready at this time, will call back

## 2023-01-17 ENCOUNTER — Observation Stay (HOSPITAL_COMMUNITY): Payer: Medicare PPO

## 2023-01-17 ENCOUNTER — Ambulatory Visit: Payer: Medicare PPO | Attending: Physician Assistant | Admitting: Physician Assistant

## 2023-01-17 LAB — CBC WITH DIFF
BASOPHIL #: <0.1 10*3/uL (ref <=0.20)
BASOPHIL %: 0 %
EOSINOPHIL #: <0.1 10*3/uL (ref <=0.50)
EOSINOPHIL %: 0 %
HCT: 39.3 % (ref 34.8–46.0)
HGB: 12.4 g/dL (ref 11.5–16.0)
IMMATURE GRANULOCYTE #: <0.1 10*3/uL (ref <0.10)
IMMATURE GRANULOCYTE %: 0 % (ref 0.0–1.0)
LYMPHOCYTE #: 0.91 10*3/uL — ABNORMAL LOW (ref 1.00–4.80)
LYMPHOCYTE %: 19 %
MCH: 25.7 pg — ABNORMAL LOW (ref 26.0–32.0)
MCHC: 31.6 g/dL (ref 31.0–35.5)
MCV: 81.4 fL (ref 78.0–100.0)
MONOCYTE #: 0.28 10*3/uL (ref 0.20–1.10)
MONOCYTE %: 6 %
MPV: 11.9 fL (ref 8.7–12.5)
NEUTROPHIL #: 3.54 10*3/uL (ref 1.50–7.70)
NEUTROPHIL %: 75 %
PLATELETS: 124 10*3/uL — ABNORMAL LOW (ref 150–400)
RBC: 4.83 10*6/uL (ref 3.85–5.22)
RDW-CV: 16.1 % — ABNORMAL HIGH (ref 11.5–15.5)
WBC: 4.8 10*3/uL (ref 3.7–11.0)

## 2023-01-17 LAB — COMPREHENSIVE METABOLIC PANEL, NON-FASTING
ALBUMIN: 2.9 g/dL — ABNORMAL LOW (ref 3.4–4.8)
ALKALINE PHOSPHATASE: 130 U/L (ref 55–145)
ALT (SGPT): 14 U/L (ref <31)
ANION GAP: 13 mmol/L (ref 4–13)
AST (SGOT): 30 U/L (ref 11–34)
BILIRUBIN TOTAL: 0.4 mg/dL (ref 0.3–1.3)
BUN/CREA RATIO: 20 (ref 6–22)
BUN: 20 mg/dL (ref 8–25)
CALCIUM: 9.2 mg/dL (ref 8.6–10.3)
CHLORIDE: 108 mmol/L (ref 96–111)
CO2 TOTAL: 21 mmol/L — ABNORMAL LOW (ref 23–31)
CREATININE: 0.99 mg/dL (ref 0.60–1.05)
ESTIMATED GFR - FEMALE: 52 mL/min/BSA — ABNORMAL LOW (ref >=60)
GLUCOSE: 153 mg/dL — ABNORMAL HIGH (ref 65–125)
POTASSIUM: 4.1 mmol/L (ref 3.5–5.1)
PROTEIN TOTAL: 6.8 g/dL (ref 6.0–8.0)
SODIUM: 142 mmol/L (ref 136–145)

## 2023-01-17 MED ORDER — ALBUTEROL SULFATE 2.5 MG/3 ML (0.083 %) SOLUTION FOR NEBULIZATION
2.5000 mg | INHALATION_SOLUTION | Freq: Two times a day (BID) | RESPIRATORY_TRACT | Status: DC
Start: 2023-01-17 — End: 2023-01-17

## 2023-01-17 NOTE — Discharge Summary (Signed)
[facility]  DISCHARGE SUMMARY    PATIENT NAME:  Aarionna, Azizi  MRN:  [id number]  DOB:  06-Apr-1925    ENCOUNTER DATE:  01/16/2023  INPATIENT ADMISSION DATE:   DISCHARGE DATE:  01/17/2023    ATTENDING PHYSICIAN: Leonarda Salon, MD  SERVICE: [facility] HOSPITALIST  PRIMARY CARE PHYSICIAN: No Pcp       Lay Caregiver Name: Wadie Lessen Caregiver Contact Number: [phone]   Lay Caregiver Relationship to patient: family member (niece)    PRIMARY DISCHARGE DIAGNOSIS: Acute hypoxemic respiratory failure due to COVID-19 (CMS [facility])  Active Hospital Problems    Diagnosis Date Noted    GERD (gastroesophageal reflux disease) [K21.9]     HTN (hypertension) [I10]       Resolved Hospital Problems    Diagnosis     Principal Problem: Acute hypoxemic respiratory failure due to COVID-19 (CMS HCC) [U07.1, J96.01]      Active Non-Hospital Problems    Diagnosis Date Noted    Syncope and collapse 08/04/2022    Acute upper GI bleed 03/29/2022    Vertigo 11/08/2021           Current Discharge Medication List        CONTINUE these medications - NO CHANGES were made during your visit.        Details   albuterol sulfate 90 mcg/actuation oral inhaler  Commonly known as: PROVENTIL or VENTOLIN or PROAIR   1-2 Puffs, Inhalation, EVERY 6 HOURS PRN  Refills: 0     amLODIPine 2.5 mg Tablet  Commonly known as: NORVASC   2.5 mg, Oral, DAILY  Refills: 0     clonazePAM 0.5 mg Tablet, Rapid Dissolve  Commonly known as: klonoPIN   0.5 mg, Oral, EVERY 12 HOURS PRN  Qty: 14 Tablet  Refills: 0     HYDROcodone-acetaminophen 10-325 mg Tablet  Commonly known as: NORCO   1 Tablet, Oral, EVERY 8 HOURS PRN  Refills: 0     ipratropium-albuteroL 0.5 mg-3 mg(2.5 mg base)/3 mL nebulizer solution  Commonly known as: DUONEB   3 mL, Nebulization, EVERY 4 HOURS PRN  Qty: 120 mL  Refills: 0     levothyroxine 25 mcg Tablet  Commonly known as: SYNTHROID   25 mcg, Oral, EVERY MORNING, Unsure of dose  Refills: 0     meclizine 12.5 mg Tablet  Commonly  known as: ANTIVERT   12.5 mg, Oral, EVERY 8 HOURS PRN  Qty: 30 Tablet  Refills: 0     nebivoloL 10 mg Tablet  Commonly known as: BYSTOLIC   10 mg, Oral, 2 TIMES DAILY  Refills: 0     pantoprazole 40 mg Tablet, Delayed Release (E.C.)  Commonly known as: PROTONIX   40 mg, Oral, DAILY  Qty: 30 Tablet  Refills: 0     promethazine 25 mg Tablet  Commonly known as: PHENERGAN   25 mg, Oral, EVERY 6 HOURS PRN  Refills: 0            Discharge med list refreshed?  YES     Allergies   Allergen Reactions    Cephalexin  Other Adverse Reaction (Add comment)     unknown    Hctz [Hydrochlorothiazide]  Other Adverse Reaction (Add comment)     unknown    Iodine     Macrodantin [Nitrofurantoin]  Other Adverse Reaction (Add comment)     unknown    Minocin [Minocycline]  Other Adverse Reaction (Add comment)     unknown  HOSPITAL PROCEDURE(S):   No orders of the defined types were placed in this encounter.      REASON FOR HOSPITALIZATION AND HOSPITAL COURSE   BRIEF HPI:  This is a 89 y.o., female admitted for acute hypoxemia secondary to COVID-19. Patient has a history of chronic obstructive pulmonary disease and presented to the emergency department with complaints of shortness a breath. Patient is extremely hard of hearing and history had to be taken from ED record. Patient states that she had had no shortness a breath over the last few days. Patient's entire family has COVID-19. Patient presented to the ER for evaluation. In the emergency room she had laboratory studies and diagnostic imaging. He was found to be hypoxic with an O2 sat in the 80s. There was an attempt to treat patient with steroids and neb treatments however she was unable to be taken off of oxygen successfully. Workup included laboratory studies and diagnostic imaging. Workup was unremarkable. She was COVID positive but VBG, CMP and CBC were all within normal limits. Urinalysis was obtained with no evidence of infection. Chest x-ray was obtained showing no acute  cardiopulmonary process. She was placed under hospitalist service for hypoxia and further treatment.     BRIEF HOSPITAL NARRATIVE:     Patient was placed under hospitalist service.  Oxygen has been closely monitored.  Respiratory protocol was ordered.  Patient was oxygen has been titrated down and has been discontinued.  Patient has been doing very well without oxygen.  Chest x-ray was repeated along with laboratory studies morning.  Chest x-ray is clear with no evidence of acute pulmonary abnormalities.  Patient was afebrile.  At this time patient can safely be discharged home.  She needs to return to the emergency department for increased shortness a breath, fever, chills or any symptoms of concern.  Patient was discharged home with family.      CONDITION ON DISCHARGE:  A. Ambulation: Ambulation with assistive device  B. Self-care Ability: With partial assistance  C. Cognitive Status Alert and Oriented x 3  D. Code status at discharge:       LINES/DRAINS/WOUNDS AT DISCHARGE:   Patient Lines/Drains/Airways Status       Active Line / Dialysis Catheter / Dialysis Graft / Drain / Airway / Wound       Name Placement date Placement time Site Days    Peripheral IV Posterior;Right Dorsal Metacarpals  (top of hand) 01/16/23  --  -- 1                    DISCHARGE DISPOSITION:  Home discharge      DISCHARGE INSTRUCTIONS:       DISCHARGE INSTRUCTION - DIET     Diet: RESUME HOME DIET      DISCHARGE INSTRUCTION - ACTIVITY     Activity: AS TOLERATED      DISCHARGE INSTRUCTION - MISC    Continue taking home medications  Return to the emergency room for fever, chills, shortness for breath or symptoms of concern  See PCP in the next week          Ronnald Nian, PA-C    Copies sent to Care Team         Relationship Specialty Notifications Start End    Pcp, No PCP - General   06/13/22             Referring providers can utilize [email or web address] to access their referred [facility] Medicine patient's information.  I personally saw and evaluated the patient as part of a shared service with an APP.    My substantive findings are:  SUBSTANTIVE FINDINGS: MDM (complete).  Patient admitted to the hospital for acute hypoxic respiratory failure secondary to COVID-19.  The patient was placed on med surge.  She was started on respiratory protocol.  Oxygen was titrated down.  The patient is now on room air.  Patient can be discharged home at this time.     I independently of the APP spent a total of (15) minutes in direct/indirect care of this patient including initial evaluation, review of laboratory, radiology, diagnostic studies, review of medical record, order entry and coordination of care.     Leonarda Salon, MD  01/17/2023, 17:58

## 2023-01-17 NOTE — Nurses Notes (Signed)
Patient laying in bed. No distress noted. O2 93% on 1L. Patient rates pain 0/10. Patient is coughing up thick sputum. No concerns at this time. Call bell within reach.

## 2023-01-17 NOTE — H&P (Signed)
[facility]  Hospitalist  History & Physical    Date of Service:  01/17/2023  Tricia, Bailey, 89 y.o. female  Date of Admission:  01/16/2023  Date of Birth:  12-25-1924  PCP: No Pcp    Chief Complaint:  Shortness of breath    HPI:  Tricia Bailey is a 89 y.o. White female who is admitted for acute hypoxemia secondary to COVID-19.  Patient has a history of chronic obstructive pulmonary disease and presented to the emergency department with complaints of shortness a breath.  Patient is extremely hard of hearing and history had to be taken from ED record.  Patient states that she had had no shortness a breath over the last few days.  Patient's entire family has COVID-19.  Patient presented to the ER for evaluation.  In the emergency room she had laboratory studies and diagnostic imaging.  He was found to be hypoxic with an O2 sat in the 80s.  There was an attempt to treat patient with steroids and neb treatments however she was unable to be taken off of oxygen successfully.  Workup included laboratory studies and diagnostic imaging.  Workup was unremarkable.  She was COVID positive but VBG, CMP and CBC were all within normal limits.  Urinalysis was obtained with no evidence of infection.  Chest x-ray was obtained showing no acute cardiopulmonary process.  She was placed under hospitalist service for hypoxia and further treatment.    Past Medical History:   Diagnosis Date    Arthropathy     COPD (chronic obstructive pulmonary disease) (CMS HCC)     GERD (gastroesophageal reflux disease)     HTN (hypertension)       History reviewed. No pertinent surgical history.   Social History     Tobacco Use    Smoking status: Never    Smokeless tobacco: Never   Vaping Use    Vaping status: Never Used   Substance Use Topics    Alcohol use: Never    Drug use: Never       Family Medical History:    None        Medications Prior to Admission       Prescriptions    albuterol sulfate (PROVENTIL OR VENTOLIN OR  PROAIR) 90 mcg/actuation Inhalation HFA Aerosol Inhaler    Take 1-2 Puffs by inhalation Every 6 hours as needed    amLODIPine (NORVASC) 2.5 mg Oral Tablet    Take 1 Tablet (2.5 mg total) by mouth Once a day    clonazePAM (KLONOPIN) 0.5 mg Oral Tablet, Rapid Dissolve    Take 1 Tablet (0.5 mg total) by mouth Every 12 hours as needed for Anxiety or Other (dizziness)    HYDROcodone-acetaminophen (NORCO) 10-325 mg Oral Tablet    Take 1 Tablet by mouth Every 8 hours as needed for Pain    ipratropium-albuterol 0.5 mg-3 mg(2.5 mg base)/3 mL Solution for Nebulization    Take 3 mL by nebulization Every 4 hours as needed for Wheezing    levothyroxine (SYNTHROID) 25 mcg Oral Tablet    Take 1 Tablet (25 mcg total) by mouth Every morning Unsure of dose    meclizine (ANTIVERT) 12.5 mg Oral Tablet    Take 1 Tablet (12.5 mg total) by mouth Every 8 hours as needed for Dizziness    nebivoloL (BYSTOLIC) 10 mg Oral Tablet    Take 1 Tablet (10 mg total) by mouth Twice daily    pantoprazole (PROTONIX) 40 mg Oral  Tablet, Delayed Release (E.C.)    Take 1 Tablet (40 mg total) by mouth Once a day    promethazine (PHENERGAN) 25 mg Oral Tablet    Take 1 Tablet (25 mg total) by mouth Every 6 hours as needed for Nausea/Vomiting           Allergies   Allergen Reactions    Cephalexin  Other Adverse Reaction (Add comment)     unknown    Hctz [Hydrochlorothiazide]  Other Adverse Reaction (Add comment)     unknown    Iodine     Macrodantin [Nitrofurantoin]  Other Adverse Reaction (Add comment)     unknown    Minocin [Minocycline]  Other Adverse Reaction (Add comment)     unknown          Advanced Directives:        Review of Systems   Constitutional:  Negative for chills, diaphoresis and fever.   HENT:  Negative for congestion, ear pain and sore throat.    Eyes: Negative.    Respiratory:  Positive for cough, sputum production and shortness of breath. Negative for hemoptysis and wheezing.    Cardiovascular:  Negative for chest pain, palpitations,  orthopnea and leg swelling.   Gastrointestinal:  Negative for abdominal pain, constipation, diarrhea, heartburn, nausea and vomiting.   Genitourinary:  Negative for dysuria.   Musculoskeletal: Negative.    Skin:  Negative for rash.   Neurological:  Negative for dizziness and headaches.   Psychiatric/Behavioral:  The patient does not have insomnia.           Filed Vitals:    01/17/23 0445 01/17/23 0720 01/17/23 0821 01/17/23 1250   BP: 138/88  (!) 151/82 (!) 151/85   Pulse: 76  86 75   Resp: 16  20 (!) 24   Temp: 36.6 C (97.8 F)  36.8 C (98.2 F) 36.6 C (97.8 F)   SpO2:  94%         Physical Exam  Vitals and nursing note reviewed.   Constitutional:       General: She is not in acute distress.     Appearance: Normal appearance. She is not diaphoretic.      Interventions: Nasal cannula in place.   HENT:      Head: Normocephalic and atraumatic.      Right Ear: External ear normal.      Left Ear: External ear normal.      Nose: Nose normal.      Mouth/Throat:      Mouth: Mucous membranes are moist.   Eyes:      General: No scleral icterus.     Conjunctiva/sclera: Conjunctivae normal.      Pupils: Pupils are equal, round, and reactive to light.   Cardiovascular:      Rate and Rhythm: Normal rate and regular rhythm.      Heart sounds: Normal heart sounds. No murmur heard.     No friction rub. No gallop.   Pulmonary:      Effort: Pulmonary effort is normal. No respiratory distress.      Breath sounds: Normal breath sounds. No wheezing or rales.   Chest:      Chest wall: No tenderness.   Abdominal:      General: Bowel sounds are normal. There is no distension.      Palpations: Abdomen is soft. There is no mass.      Tenderness: There is no abdominal tenderness. There is no guarding or rebound.  Musculoskeletal:         General: Normal range of motion.      Cervical back: Normal range of motion and neck supple.   Skin:     General: Skin is warm and dry.      Findings: No erythema or rash.   Neurological:      Mental  Status: She is alert and oriented to person, place, and time.      Cranial Nerves: No cranial nerve deficit.   Psychiatric:         Mood and Affect: Mood and affect normal.         Behavior: Behavior normal.         Thought Content: Thought content normal.          Laboratory Data:     Admission on 01/16/2023   Component Date Value    SODIUM 01/16/2023 142     POTASSIUM 01/16/2023 3.7     CHLORIDE 01/16/2023 107     CO2 TOTAL 01/16/2023 25     ANION GAP 01/16/2023 10     BUN 01/16/2023 16     CREATININE 01/16/2023 1.12 (H)     BUN/CREA RATIO 01/16/2023 14     ALBUMIN 01/16/2023 2.9 (L)     CALCIUM 01/16/2023 8.8     GLUCOSE 01/16/2023 103     ALKALINE PHOSPHATASE 01/16/2023 120     ALT (SGPT) 01/16/2023 14     AST (SGOT)  01/16/2023 33     BILIRUBIN TOTAL 01/16/2023 0.5     PROTEIN TOTAL 01/16/2023 6.5     ESTIMATED GFR - FEMALE 01/16/2023 45 (L)     LACTIC ACID 01/16/2023 1.8     %FIO2 (VENOUS) 01/16/2023 21.0     PH (VENOUS) 01/16/2023 7.38     PCO2 (VENOUS) 01/16/2023 46     PO2 (VENOUS) 01/16/2023 107     BICARBONATE (VENOUS) 01/16/2023 26.1     BASE EXCESS 01/16/2023 1.5     O2 SATURATION (VENOUS) 01/16/2023 98.0     SARS-CoV-2 01/16/2023 Detected (A)     INFLUENZA VIRUS TYPE A 01/16/2023 Not Detected     INFLUENZA VIRUS TYPE B 01/16/2023 Not Detected     RESPIRATORY SYNCTIAL VIR* 01/16/2023 Not Detected     WBC 01/16/2023 4.8     RBC 01/16/2023 4.47     HGB 01/16/2023 11.5     HCT 01/16/2023 36.9     MCV 01/16/2023 82.6     MCH 01/16/2023 25.7 (L)     MCHC 01/16/2023 31.2     RDW-CV 01/16/2023 16.3 (H)     PLATELETS 01/16/2023 117 (L)     NEUTROPHIL % 01/16/2023 59.0     LYMPHOCYTE % 01/16/2023 28.0     MONOCYTE % 01/16/2023 11.0     EOSINOPHIL % 01/16/2023 1.0     BASOPHIL % 01/16/2023 1.0     NEUTROPHIL # 01/16/2023 2.85     LYMPHOCYTE # 01/16/2023 1.31     MONOCYTE # 01/16/2023 0.54     EOSINOPHIL # 01/16/2023 <0.10     BASOPHIL # 01/16/2023 <0.10     IMMATURE GRANULOCYTE % 01/16/2023 0.0     IMMATURE  GRANULOCYTE # 01/16/2023 <0.10     COLOR 01/16/2023 Yellow     APPEARANCE 01/16/2023 Clear     SPECIFIC GRAVITY 01/16/2023 >=1.030     PH 01/16/2023 5.5     LEUKOCYTES 01/16/2023 Negative     NITRITE 01/16/2023 Negative     PROTEIN 01/16/2023  30 (A)     GLUCOSE 01/16/2023 Negative     KETONES 01/16/2023 Negative     UROBILINOGEN 01/16/2023 0.2     BILIRUBIN 01/16/2023 Small (A)     BLOOD 01/16/2023 Negative     RBCS 01/16/2023 3-5     WBCS 01/16/2023 6-10 (A)     BACTERIA 01/16/2023 Few (A)     SQUAMOUS EPITHELIAL 01/16/2023 16-20 (A)     MUCOUS 01/16/2023 Moderate (A)     AMORPHOUS SEDIMENT 01/16/2023 Occasional (A)     BNP, POC 01/16/2023 622 (H)     RAINBOW/EXTRA TUBE AUTO * 01/16/2023 Yes     RAINBOW/EXTRA TUBE AUTO * 01/16/2023 Yes     RBC MORPHOLOGY 01/16/2023 Normal RBC and PLT Morphology     SODIUM 01/17/2023 142     POTASSIUM 01/17/2023 4.1     CHLORIDE 01/17/2023 108     CO2 TOTAL 01/17/2023 21 (L)     ANION GAP 01/17/2023 13     BUN 01/17/2023 20     CREATININE 01/17/2023 0.99     BUN/CREA RATIO 01/17/2023 20     ALBUMIN 01/17/2023 2.9 (L)     CALCIUM 01/17/2023 9.2     GLUCOSE 01/17/2023 153 (H)     ALKALINE PHOSPHATASE 01/17/2023 130     ALT (SGPT) 01/17/2023 14     AST (SGOT)  01/17/2023 30     BILIRUBIN TOTAL 01/17/2023 0.4     PROTEIN TOTAL 01/17/2023 6.8     ESTIMATED GFR - FEMALE 01/17/2023 52 (L)     WBC 01/17/2023 4.8     RBC 01/17/2023 4.83     HGB 01/17/2023 12.4     HCT 01/17/2023 39.3     MCV 01/17/2023 81.4     MCH 01/17/2023 25.7 (L)     MCHC 01/17/2023 31.6     RDW-CV 01/17/2023 16.1 (H)     PLATELETS 01/17/2023 124 (L)     MPV 01/17/2023 11.9     NEUTROPHIL % 01/17/2023 75.0     LYMPHOCYTE % 01/17/2023 19.0     MONOCYTE % 01/17/2023 6.0     EOSINOPHIL % 01/17/2023 0.0     BASOPHIL % 01/17/2023 0.0     NEUTROPHIL # 01/17/2023 3.54     LYMPHOCYTE # 01/17/2023 0.91 (L)     MONOCYTE # 01/17/2023 0.28     EOSINOPHIL # 01/17/2023 <0.10     BASOPHIL # 01/17/2023 <0.10     IMMATURE GRANULOCYTE %  01/17/2023 0.0     IMMATURE GRANULOCYTE # 01/17/2023 <0.10         Imaging Studies:    XR AP MOBILE CHEST   Final Result by Edi, Radresults In (08/08 0645)   1. No acute cardiopulmonary process is appreciated.            Radiologist location ID: [id number]         XR AP MOBILE CHEST   Final Result by Edi, Radresults In (08/07 1316)   No acute cardiopulmonary process.         JRM               Radiologist location ID: [id number]             Assessment/Plan:  Active Hospital Problems    Diagnosis    GERD (gastroesophageal reflux disease)    HTN (hypertension)       1. Acute hypoxemia secondary to COVID-19.  Patient was placed under hospitalist service.  She will have chest x-ray  laboratory studies daily.  Respiratory protocol will be ordered.  Titrate O2 as tolerated.  Patient was not wear oxygen at baseline.  2. gastroesophageal reflux disease.  Continue PPI therapy.    3. Hypertension.  Chronic and stable.  4. deep vein thrombosis prophylaxis.  Lovenox subQ daily.    Estimated Length of Stay:  2-3 days    I spent a total of (30) minutes in direct/indirect care of this patient including initial evaluation, review of laboratory, radiology, diagnostic studies, review of medical record, order entry and coordination of care.     Ronnald Nian, PA-C    I personally saw and evaluated the patient as part of a shared service with an APP.    My substantive findings are:  SUBSTANTIVE FINDINGS: MDM (complete).  Patient admitted to the hospital for acute hypoxic respiratory failure secondary to COVID-19.  Patient was placed on hospitalist service.  Respiratory protocol was noted.  Titrate O2.  Chest x-ray and labs in morning.     I independently of the APP spent a total of (15) minutes in direct/indirect care of this patient including initial evaluation, review of laboratory, radiology, diagnostic studies, review of medical record, order entry and coordination of care.     Leonarda Salon, MD  01/17/2023, 17:57

## 2023-01-17 NOTE — Respiratory Therapy (Signed)
Pt on 1L nasal cannula, SpO2 94%, pt need encouragement with incentive spirometry therapy, pt achieve volume of 100-250 ml , tx give as order will continue to monitor.

## 2023-01-17 NOTE — Nurses Notes (Signed)

## 2023-01-17 NOTE — Discharge Instructions (Signed)
Review discharge instructions.

## 2023-01-21 LAB — ADULT ROUTINE BLOOD CULTURE, SET OF 2 BOTTLES (BACTERIA AND YEAST)
BLOOD CULTURE, ROUTINE: NO GROWTH
BLOOD CULTURE, ROUTINE: NO GROWTH

## 2023-08-28 ENCOUNTER — Emergency Department (HOSPITAL_COMMUNITY)

## 2023-08-28 ENCOUNTER — Emergency Department (HOSPITAL_COMMUNITY): Admitting: Radiology

## 2023-08-28 ENCOUNTER — Inpatient Hospital Stay (HOSPITAL_COMMUNITY): Admitting: Surgery

## 2023-08-28 ENCOUNTER — Inpatient Hospital Stay
Admission: AC | Admit: 2023-08-28 | Discharge: 2023-09-10 | DRG: 922 | Disposition: E | Source: Other Acute Inpatient Hospital | Attending: Surgery | Admitting: Surgery

## 2023-08-28 ENCOUNTER — Other Ambulatory Visit: Payer: Self-pay

## 2023-08-28 ENCOUNTER — Inpatient Hospital Stay (HOSPITAL_COMMUNITY)

## 2023-08-28 ENCOUNTER — Encounter (HOSPITAL_COMMUNITY): Payer: Self-pay

## 2023-08-28 DIAGNOSIS — K668 Other specified disorders of peritoneum: Secondary | ICD-10-CM

## 2023-08-28 DIAGNOSIS — I959 Hypotension, unspecified: Secondary | ICD-10-CM

## 2023-08-28 DIAGNOSIS — I700 Atherosclerosis of aorta: Secondary | ICD-10-CM

## 2023-08-28 DIAGNOSIS — S3993XA Unspecified injury of pelvis, initial encounter: Secondary | ICD-10-CM

## 2023-08-28 DIAGNOSIS — W1830XA Fall on same level, unspecified, initial encounter: Secondary | ICD-10-CM | POA: Diagnosis present

## 2023-08-28 DIAGNOSIS — J9811 Atelectasis: Secondary | ICD-10-CM

## 2023-08-28 DIAGNOSIS — S32409A Unspecified fracture of unspecified acetabulum, initial encounter for closed fracture: Principal | ICD-10-CM

## 2023-08-28 DIAGNOSIS — R578 Other shock: Secondary | ICD-10-CM

## 2023-08-28 DIAGNOSIS — I10 Essential (primary) hypertension: Secondary | ICD-10-CM | POA: Diagnosis present

## 2023-08-28 DIAGNOSIS — R079 Chest pain, unspecified: Secondary | ICD-10-CM

## 2023-08-28 DIAGNOSIS — S299XXA Unspecified injury of thorax, initial encounter: Secondary | ICD-10-CM

## 2023-08-28 DIAGNOSIS — Z66 Do not resuscitate: Secondary | ICD-10-CM | POA: Diagnosis present

## 2023-08-28 DIAGNOSIS — S32402A Unspecified fracture of left acetabulum, initial encounter for closed fracture: Secondary | ICD-10-CM | POA: Diagnosis present

## 2023-08-28 DIAGNOSIS — T794XXA Traumatic shock, initial encounter: Principal | ICD-10-CM | POA: Diagnosis present

## 2023-08-28 DIAGNOSIS — I7143 Infrarenal abdominal aortic aneurysm, without rupture: Secondary | ICD-10-CM | POA: Diagnosis present

## 2023-08-28 DIAGNOSIS — Z79890 Hormone replacement therapy (postmenopausal): Secondary | ICD-10-CM

## 2023-08-28 DIAGNOSIS — R9431 Abnormal electrocardiogram [ECG] [EKG]: Secondary | ICD-10-CM

## 2023-08-28 DIAGNOSIS — S329XXA Fracture of unspecified parts of lumbosacral spine and pelvis, initial encounter for closed fracture: Secondary | ICD-10-CM

## 2023-08-28 DIAGNOSIS — Z79899 Other long term (current) drug therapy: Secondary | ICD-10-CM

## 2023-08-28 DIAGNOSIS — S2231XA Fracture of one rib, right side, initial encounter for closed fracture: Secondary | ICD-10-CM | POA: Diagnosis present

## 2023-08-28 DIAGNOSIS — J449 Chronic obstructive pulmonary disease, unspecified: Secondary | ICD-10-CM | POA: Diagnosis present

## 2023-08-28 DIAGNOSIS — K661 Hemoperitoneum: Secondary | ICD-10-CM | POA: Diagnosis present

## 2023-08-28 DIAGNOSIS — I2510 Atherosclerotic heart disease of native coronary artery without angina pectoris: Secondary | ICD-10-CM

## 2023-08-28 DIAGNOSIS — S32412A Displaced fracture of anterior wall of left acetabulum, initial encounter for closed fracture: Secondary | ICD-10-CM

## 2023-08-28 DIAGNOSIS — T1490XA Injury, unspecified, initial encounter: Secondary | ICD-10-CM | POA: Diagnosis present

## 2023-08-28 DIAGNOSIS — K219 Gastro-esophageal reflux disease without esophagitis: Secondary | ICD-10-CM | POA: Diagnosis present

## 2023-08-28 DIAGNOSIS — Z515 Encounter for palliative care: Secondary | ICD-10-CM

## 2023-08-28 DIAGNOSIS — N9489 Other specified conditions associated with female genital organs and menstrual cycle: Secondary | ICD-10-CM

## 2023-08-28 DIAGNOSIS — E039 Hypothyroidism, unspecified: Secondary | ICD-10-CM | POA: Diagnosis present

## 2023-08-28 DIAGNOSIS — S3991XA Unspecified injury of abdomen, initial encounter: Secondary | ICD-10-CM

## 2023-08-28 DIAGNOSIS — S358X1A Laceration of other blood vessels at abdomen, lower back and pelvis level, initial encounter: Secondary | ICD-10-CM

## 2023-08-28 DIAGNOSIS — N2889 Other specified disorders of kidney and ureter: Secondary | ICD-10-CM

## 2023-08-28 DIAGNOSIS — I447 Left bundle-branch block, unspecified: Secondary | ICD-10-CM | POA: Diagnosis present

## 2023-08-28 DIAGNOSIS — J90 Pleural effusion, not elsewhere classified: Secondary | ICD-10-CM

## 2023-08-28 DIAGNOSIS — K7689 Other specified diseases of liver: Secondary | ICD-10-CM

## 2023-08-28 DIAGNOSIS — S32592A Other specified fracture of left pubis, initial encounter for closed fracture: Secondary | ICD-10-CM | POA: Diagnosis present

## 2023-08-28 DIAGNOSIS — R627 Adult failure to thrive: Secondary | ICD-10-CM | POA: Diagnosis present

## 2023-08-28 DIAGNOSIS — S32591A Other specified fracture of right pubis, initial encounter for closed fracture: Secondary | ICD-10-CM | POA: Diagnosis present

## 2023-08-28 DIAGNOSIS — X58XXXA Exposure to other specified factors, initial encounter: Secondary | ICD-10-CM

## 2023-08-28 DIAGNOSIS — W19XXXA Unspecified fall, initial encounter: Secondary | ICD-10-CM

## 2023-08-28 DIAGNOSIS — R4182 Altered mental status, unspecified: Secondary | ICD-10-CM | POA: Diagnosis present

## 2023-08-28 DIAGNOSIS — S32401A Unspecified fracture of right acetabulum, initial encounter for closed fracture: Secondary | ICD-10-CM | POA: Diagnosis present

## 2023-08-28 DIAGNOSIS — J948 Other specified pleural conditions: Secondary | ICD-10-CM

## 2023-08-28 LAB — PTT (PARTIAL THROMBOPLASTIN TIME): APTT: 23.1 s — ABNORMAL LOW (ref 24.3–37.6)

## 2023-08-28 LAB — BLOOD GAS W/ CO-OX, LYTES, LACTATE REFLEX
%FIO2 (VENOUS): 21 %
BASE EXCESS: 1 mmol/L (ref 0.0–3.0)
BICARBONATE (VENOUS): 25.1 mmol/L (ref 22.0–29.0)
CARBOXYHEMOGLOBIN: 1.6 % (ref <=3.0)
CHLORIDE: 105 mmol/L (ref 98–107)
GLUCOSE: 124 mg/dL (ref 65–125)
HEMATOCRITRT: 32 % — ABNORMAL LOW (ref 37–50)
HEMOGLOBIN: 10.6 g/dL — ABNORMAL LOW (ref 12.0–18.0)
IONIZED CALCIUM: 1.2 mmol/L (ref 1.15–1.33)
LACTATE: 2.1 mmol/L — ABNORMAL HIGH (ref <=1.9)
MET-HEMOGLOBIN: <0.7 % (ref <=1.5)
O2 SATURATION (VENOUS): 70.4 % (ref 40.0–85.0)
O2CT: 10.3 %
OXYHEMOGLOBIN: 69.3 % (ref 40.0–80.0)
PCO2 (VENOUS): 49 mmHg (ref 41–51)
PH (VENOUS): 7.35 (ref 7.32–7.43)
PO2 (VENOUS): 39 mmHg (ref 35–50)
SODIUM: 134 mmol/L — ABNORMAL LOW (ref 136–145)
WHOLE BLOOD POTASSIUM: 4.3 mmol/L (ref 3.5–5.1)

## 2023-08-28 LAB — BASIC METABOLIC PANEL
ANION GAP: 7 mmol/L (ref 4–13)
BUN/CREA RATIO: 23 — ABNORMAL HIGH (ref 6–22)
BUN: 19 mg/dL (ref 8–25)
CALCIUM: 8.3 mg/dL — ABNORMAL LOW (ref 8.6–10.3)
CHLORIDE: 106 mmol/L (ref 96–111)
CO2 TOTAL: 23 mmol/L (ref 23–31)
CREATININE: 0.82 mg/dL (ref 0.60–1.05)
ESTIMATED GFR - FEMALE: 65 mL/min/BSA (ref >=60)
GLUCOSE: 127 mg/dL — ABNORMAL HIGH (ref 65–125)
POTASSIUM: 4.4 mmol/L (ref 3.5–5.1)
SODIUM: 136 mmol/L (ref 136–145)

## 2023-08-28 LAB — CBC WITH DIFF
BASOPHIL #: <0.1 10*3/uL (ref <=0.20)
BASOPHIL %: 0.3 %
EOSINOPHIL #: <0.1 10*3/uL (ref <=0.50)
EOSINOPHIL %: 0.2 %
HCT: 33 % — ABNORMAL LOW (ref 34.8–46.0)
HGB: 10.2 g/dL — ABNORMAL LOW (ref 11.5–16.0)
IMMATURE GRANULOCYTE #: <0.1 10*3/uL (ref <0.10)
IMMATURE GRANULOCYTE %: 0.8 % (ref 0.0–1.0)
LYMPHOCYTE #: 1.49 10*3/uL (ref 1.00–4.80)
LYMPHOCYTE %: 16 %
MCH: 25.9 pg — ABNORMAL LOW (ref 26.0–32.0)
MCHC: 30.9 g/dL — ABNORMAL LOW (ref 31.0–35.5)
MCV: 83.8 fL (ref 78.0–100.0)
MONOCYTE #: 0.64 10*3/uL (ref 0.20–1.10)
MONOCYTE %: 6.9 %
NEUTROPHIL #: 7.07 10*3/uL (ref 1.50–7.70)
NEUTROPHIL %: 75.8 %
PLATELETS: 120 10*3/uL — ABNORMAL LOW (ref 150–400)
RBC: 3.94 10*6/uL (ref 3.85–5.22)
RDW-CV: 16 % — ABNORMAL HIGH (ref 11.5–15.5)
WBC: 9.3 10*3/uL (ref 3.7–11.0)

## 2023-08-28 LAB — ECG 12-LEAD
Atrial Rate: 61 {beats}/min
Atrial Rate: 60 {beats}/min
Calculated P Axis: 76 degrees
Calculated P Axis: 76 degrees
Calculated R Axis: -46 degrees
Calculated R Axis: -46 degrees
Calculated T Axis: 94 degrees
Calculated T Axis: 99 degrees
PR Interval: 216 ms
PR Interval: 200 ms
QRS Duration: 134 ms
QRS Duration: 142 ms
QT Interval: 480 ms
QT Interval: 498 ms
QTC Calculation: 483 ms
QTC Calculation: 498 ms
Ventricular rate: 61 {beats}/min
Ventricular rate: 60 {beats}/min

## 2023-08-28 LAB — LAVENDER TOP TUBE

## 2023-08-28 LAB — H & H
HCT: 22.7 % — ABNORMAL LOW (ref 34.8–46.0)
HGB: 7.1 g/dL — ABNORMAL LOW (ref 11.5–16.0)

## 2023-08-28 LAB — PT/INR
INR: 1.08 (ref 0.80–1.20)
PROTHROMBIN TIME: 12.6 s (ref 9.2–14.0)

## 2023-08-28 LAB — LACTIC ACID LEVEL W/ REFLEX FOR LEVEL >2.0: LACTIC ACID: 4.1 mmol/L (ref 0.5–2.2)

## 2023-08-28 LAB — TEG, RAPID GLOBAL WITH LYSIS (TRAUMA)
LYS30 (%): 0 % (ref 0.0–2.6)
MA (CRT RAPID): 40.7 mm — ABNORMAL LOW (ref 52.0–70.0)
MA FIBRINOGEN (CFF): <4 mm — ABNORMAL LOW (ref 15.0–32.0)
R (CK): 2.6 min — ABNORMAL LOW (ref 4.6–9.1)

## 2023-08-28 LAB — ABO & RH: ABO/RH(D): A POS

## 2023-08-28 LAB — POC BLOOD GLUCOSE (RESULTS): GLUCOSE, POC: 191 mg/dL — ABNORMAL HIGH (ref 65–125)

## 2023-08-28 LAB — LACTIC ACID - FIRST REFLEX: LACTIC ACID: 3.1 mmol/L — ABNORMAL HIGH (ref 0.5–2.2)

## 2023-08-28 LAB — TROPONIN-I (FOR ED ONLY): TROPONIN-I HS: 7.3 ng/L (ref <=14.0)

## 2023-08-28 LAB — DARK GREEN TUBE

## 2023-08-28 LAB — ETHANOL, SERUM/PLASMA
ETHANOL: <10 mg/dL (ref <10)
ETHANOL: NOT DETECTED

## 2023-08-28 LAB — TROPONIN-I: TROPONIN-I HS: 5.1 ng/L (ref <=14.0)

## 2023-08-28 MED ORDER — OXYCODONE 10 MG TABLET
10.0000 mg | ORAL_TABLET | ORAL | Status: DC | PRN
Start: 2023-08-28 — End: 2023-08-28

## 2023-08-28 MED ORDER — SODIUM CHLORIDE 0.9 % IV BOLUS
40.0000 mL | INJECTION | Freq: Once | Status: DC
Start: 2023-08-28 — End: 2023-08-30
  Administered 2023-08-28: 0 mL via INTRAVENOUS

## 2023-08-28 MED ORDER — SODIUM CHLORIDE 0.9% INTRAVENOUS BAG/VIAL WRAPPER FOR MIXTURES
6.0000 mg | INTRAMUSCULAR | Status: AC
Start: 2023-08-28 — End: 2023-08-28
  Administered 2023-08-28: 6 mg via INTRAVENOUS
  Filled 2023-08-28: qty 5

## 2023-08-28 MED ORDER — SODIUM CHLORIDE 0.9 % IV BOLUS
40.0000 mL | INJECTION | Freq: Once | Status: DC | PRN
Start: 2023-08-28 — End: 2023-08-28

## 2023-08-28 MED ORDER — SODIUM CHLORIDE 0.9 % IV BOLUS
500.0000 mL | INJECTION | Status: DC
Start: 2023-08-28 — End: 2023-08-28

## 2023-08-28 MED ORDER — ELECTROLYTE-R (PH 7.4) INTRAVENOUS SOLUTION
INTRAVENOUS | Status: DC
Start: 2023-08-28 — End: 2023-08-28
  Administered 2023-08-28: 0 mL via INTRAVENOUS

## 2023-08-28 MED ORDER — LIDOCAINE 4 %-MENTHOL 1 % TOPICAL PATCH
1.0000 | MEDICATED_PATCH | Freq: Every day | CUTANEOUS | Status: DC
Start: 2023-08-28 — End: 2023-08-30
  Administered 2023-08-28: 1 via TRANSDERMAL
  Administered 2023-08-29: 0 via TRANSDERMAL
  Filled 2023-08-28: qty 1

## 2023-08-28 MED ORDER — GLYCOPYRROLATE 0.2 MG/ML INJECTION SOLUTION
200.0000 ug | Freq: Four times a day (QID) | INTRAMUSCULAR | Status: DC | PRN
Start: 2023-08-28 — End: 2023-08-29
  Administered 2023-08-29: 200 ug via INTRAVENOUS
  Filled 2023-08-28: qty 1

## 2023-08-28 MED ORDER — DIPHENHYDRAMINE 50 MG/ML INJECTION SOLUTION
50.0000 mg | INTRAMUSCULAR | Status: AC
Start: 2023-08-28 — End: 2023-08-28

## 2023-08-28 MED ORDER — HYDROCORTISONE SOD SUCCINATE 100 MG/2 ML VIAL WRAPPER
INTRAMUSCULAR | Status: AC
Start: 2023-08-28 — End: 2023-08-28
  Administered 2023-08-28: 200 mg via INTRAVENOUS
  Filled 2023-08-28: qty 4

## 2023-08-28 MED ORDER — LEVOTHYROXINE 25 MCG TABLET
25.0000 ug | ORAL_TABLET | Freq: Every morning | ORAL | Status: DC
Start: 2023-08-29 — End: 2023-08-28

## 2023-08-28 MED ORDER — PANTOPRAZOLE 40 MG TABLET,DELAYED RELEASE
40.0000 mg | DELAYED_RELEASE_TABLET | Freq: Every day | ORAL | Status: DC
Start: 2023-08-29 — End: 2023-08-29
  Administered 2023-08-29: 0 mg via ORAL

## 2023-08-28 MED ORDER — MORPHINE 2 MG/ML INTRAVENOUS SYRINGE
1.0000 mg | INJECTION | INTRAVENOUS | Status: DC
Start: 2023-08-28 — End: 2023-08-28
  Filled 2023-08-28: qty 1

## 2023-08-28 MED ORDER — CLONAZEPAM 0.125 MG DISINTEGRATING TABLET
0.5000 mg | ORAL_TABLET | Freq: Two times a day (BID) | ORAL | Status: DC | PRN
Start: 2023-08-28 — End: 2023-08-28

## 2023-08-28 MED ORDER — ONDANSETRON HCL (PF) 4 MG/2 ML INJECTION SOLUTION
4.0000 mg | Freq: Three times a day (TID) | INTRAMUSCULAR | Status: DC | PRN
Start: 2023-08-28 — End: 2023-08-30

## 2023-08-28 MED ORDER — OXYCODONE 5 MG TABLET
5.0000 mg | ORAL_TABLET | ORAL | Status: DC | PRN
Start: 2023-08-28 — End: 2023-08-28

## 2023-08-28 MED ORDER — PROMETHAZINE 25 MG TABLET
25.0000 mg | ORAL_TABLET | Freq: Four times a day (QID) | ORAL | Status: DC | PRN
Start: 2023-08-28 — End: 2023-08-29

## 2023-08-28 MED ORDER — SODIUM CHLORIDE 0.9 % IV BOLUS
500.0000 mL | INJECTION | Status: AC
Start: 2023-08-28 — End: 2023-08-28
  Administered 2023-08-28: 0 mL via INTRAVENOUS
  Administered 2023-08-28: 500 mL via INTRAVENOUS

## 2023-08-28 MED ORDER — NURSING DRIVEN ICU ELECTROLYTE REPLACEMENT PLACEHOLDER
Status: DC
Start: 2023-08-28 — End: 2023-08-28
  Administered 2023-08-28: 0

## 2023-08-28 MED ORDER — IOPAMIDOL 370 MG IODINE/ML (76 %) INTRAVENOUS SOLUTION
100.0000 mL | INTRAVENOUS | Status: AC
Start: 2023-08-28 — End: 2023-08-28
  Administered 2023-08-28: 100 mL via INTRAVENOUS

## 2023-08-28 MED ORDER — LORAZEPAM 2 MG/ML INJECTION WRAPPER
0.5000 mg | INTRAMUSCULAR | Status: DC | PRN
Start: 2023-08-28 — End: 2023-08-29
  Administered 2023-08-28 – 2023-08-29 (×5): 0.5 mg via INTRAVENOUS
  Filled 2023-08-28 (×5): qty 1

## 2023-08-28 MED ORDER — LEVOTHYROXINE 75 MCG TABLET
75.0000 ug | ORAL_TABLET | Freq: Every morning | ORAL | Status: DC
Start: 2023-08-29 — End: 2023-08-29
  Administered 2023-08-29: 0 ug via ORAL

## 2023-08-28 MED ORDER — NEBIVOLOL 10 MG TABLET
10.0000 mg | ORAL_TABLET | Freq: Two times a day (BID) | ORAL | Status: DC
Start: 2023-08-28 — End: 2023-08-28

## 2023-08-28 MED ORDER — DIPHENHYDRAMINE 50 MG/ML INJECTION SOLUTION
INTRAMUSCULAR | Status: AC
Start: 2023-08-28 — End: 2023-08-28
  Administered 2023-08-28: 50 mg via INTRAVENOUS
  Filled 2023-08-28: qty 1

## 2023-08-28 MED ORDER — IPRATROPIUM 0.5 MG-ALBUTEROL 3 MG (2.5 MG BASE)/3 ML NEBULIZATION SOLN
3.0000 mL | INHALATION_SOLUTION | RESPIRATORY_TRACT | Status: DC | PRN
Start: 2023-08-28 — End: 2023-08-30

## 2023-08-28 MED ORDER — LIDOCAINE (PF) 10 MG/ML (1 %) INJECTION SOLUTION
INTRAMUSCULAR | Status: DC
Start: 2023-08-28 — End: 2023-08-28
  Filled 2023-08-28: qty 30

## 2023-08-28 MED ORDER — MECLIZINE 12.5 MG TABLET
12.5000 mg | ORAL_TABLET | Freq: Three times a day (TID) | ORAL | Status: DC | PRN
Start: 2023-08-28 — End: 2023-08-28

## 2023-08-28 MED ORDER — DEXTROSE 5% IN WATER (D5W) FLUSH BAG - 250 ML
INTRAVENOUS | Status: DC | PRN
Start: 2023-08-28 — End: 2023-08-30

## 2023-08-28 MED ORDER — SODIUM CHLORIDE 0.9 % (FLUSH) INJECTION SYRINGE
2.0000 mL | INJECTION | Freq: Three times a day (TID) | INTRAMUSCULAR | Status: DC
Start: 2023-08-28 — End: 2023-08-30
  Administered 2023-08-28 – 2023-08-29 (×4): 0 mL
  Administered 2023-08-29: 5 mL

## 2023-08-28 MED ORDER — ACETAMINOPHEN 325 MG TABLET
650.0000 mg | ORAL_TABLET | ORAL | Status: DC
Start: 2023-08-28 — End: 2023-08-29
  Administered 2023-08-28 – 2023-08-29 (×5): 0 mg via ORAL

## 2023-08-28 MED ORDER — SODIUM CHLORIDE 0.9 % (FLUSH) INJECTION SYRINGE
2.0000 mL | INJECTION | INTRAMUSCULAR | Status: DC | PRN
Start: 2023-08-28 — End: 2023-08-30
  Administered 2023-08-28: 5 mL

## 2023-08-28 MED ORDER — HYDROMORPHONE (PF) 0.5 MG/0.5 ML INJECTION SYRINGE
0.2000 mg | INJECTION | INTRAMUSCULAR | Status: DC | PRN
Start: 2023-08-28 — End: 2023-08-28
  Administered 2023-08-28 (×2): 0.2 mg via INTRAVENOUS
  Filled 2023-08-28 (×2): qty 0.5

## 2023-08-28 MED ORDER — ACETAMINOPHEN 325 MG TABLET
650.0000 mg | ORAL_TABLET | ORAL | Status: DC | PRN
Start: 2023-08-28 — End: 2023-08-28

## 2023-08-28 MED ORDER — NOREPINEPHRINE BITARTRATE 16 MG/250 ML (64 MCG/ML) IN 0.9 % NACL IV
INTRAVENOUS | Status: DC
Start: 2023-08-28 — End: 2023-08-28
  Filled 2023-08-28: qty 250

## 2023-08-28 MED ORDER — SODIUM CHLORIDE 0.9% FLUSH BAG - 250 ML
INTRAVENOUS | Status: DC | PRN
Start: 2023-08-28 — End: 2023-08-30

## 2023-08-28 MED ORDER — MORPHINE 2 MG/ML INTRAVENOUS SYRINGE
2.0000 mg | INJECTION | INTRAVENOUS | Status: DC | PRN
Start: 2023-08-28 — End: 2023-08-29
  Administered 2023-08-28 – 2023-08-29 (×5): 2 mg via INTRAVENOUS
  Filled 2023-08-28 (×5): qty 1

## 2023-08-28 MED ORDER — AMLODIPINE 5 MG TABLET
2.5000 mg | ORAL_TABLET | Freq: Every day | ORAL | Status: DC
Start: 2023-08-28 — End: 2023-08-28

## 2023-08-28 MED ORDER — HYDROCORTISONE SOD SUCCINATE 100 MG/2 ML VIAL WRAPPER
200.0000 mg | INTRAMUSCULAR | Status: AC
Start: 2023-08-28 — End: 2023-08-28

## 2023-08-28 NOTE — Nurses Notes (Signed)
 Patient arrived from ED following CTA by RN. SICU at bedside. VS and assessment for flowsheet.

## 2023-08-28 NOTE — Nurses Notes (Signed)
 Pts family member came to the nurses station and reported that the patient started to become restless again and started to moan again. PRN ativan and morphine both given at this time.

## 2023-08-28 NOTE — Care Management Notes (Signed)
 [facility]  Care Management Note    Patient Name: Tricia Bailey  Date of Birth: 12/09/1924  Sex: female  Date/Time of Admission: 08/28/2023  9:08 AM  Room/Bed: 04/A  Payor: [employer]-MEDI-ADV / Plan: [employer] MEDICARE ADVANTAGE PPO / Product Type: PPO /    LOS: 0 days   Primary Care Providers:  Pcp, No (General)    Admitting Diagnosis:  Trauma [T14.90XA]    Assessment:      08/28/23 1458   Assessment Details   Assessment Type Continued Assessment   Date of Care Management Update 08/28/23   Date of Next DCP Update 08/30/23   ADVANCE DIRECTIVES   Does the Patient have an Advance Directive? Yes, Patient Does Have Advance Directive for Healthcare Treatment   Type of Advance Directive Completed Medical Power of Attorney   Copy of Advance Directives in Chart? Yes, Copy on Chart.(Specify in Comment Which Advance Directive)   Name of MPOA or Healthcare Surrogate Atilano Ina   Phone Number of MPOA or Healthcare Surrogate [phone]     MSW received call from service regarding need for decision maker. MSW located MPOA in [facility].  Copy placed in hard chart.  Copy placed in CM office to be scanned.    Discharge Plan:  Undetermined at this time    The patient will continue to be evaluated for developing discharge needs.     Case Manager: Barbra Sarks, SOCIAL WORKER  Phone: [phone]

## 2023-08-28 NOTE — ED Provider Notes (Cosign Needed)
 [facility]  Emergency Department  Trauma Note    Name: Tricia Bailey  Age and Gender: 89 y.o. female  Date of Birth: Jan 29, 1925  Date of Service: 08/28/2023  PCP: No Pcp  Attending: Linna Darner, MD      History of Present Illness    Tricia Bailey is a 89 y.o. female P2 Trauma page transfer for fall.  Patient transported by EMS from [place] with no LOC.   Please see nursing chart for review of patient vitals.   GCS of 15 on arrival.  Patient presents after fall. Per report patient sustained a ground level fall last night. No LOC. Patient initially presented to [facility] where her CT imaging showed bilateral acetabulum fractures, left pubic ramus fracture, and extra peritoneal hemorrhage. Patient was transferred to [facility] ED for further care. VSS en route. Patient received 1 mg of Morphine en route. PMH includes COPD, GERD, and HTN.  Tetanus status: Deferred.   Anticoagulant use: None.      Review of Systems  Systems negative and positive per HPI.         Past Medical History:   Diagnosis Date    Arthropathy     COPD (chronic obstructive pulmonary disease) (CMS HCC)     GERD (gastroesophageal reflux disease)     HTN (hypertension)              Previous Medications    ALBUTEROL SULFATE (PROVENTIL OR VENTOLIN OR PROAIR) 90 MCG/ACTUATION INHALATION HFA AEROSOL INHALER    Take 1-2 Puffs by inhalation Every 6 hours as needed    AMLODIPINE (NORVASC) 2.5 MG ORAL TABLET    Take 1 Tablet (2.5 mg total) by mouth Twice daily    CLONAZEPAM (KLONOPIN) 0.5 MG ORAL TABLET, RAPID DISSOLVE    Take 1 Tablet (0.5 mg total) by mouth Every 12 hours as needed for Anxiety or Other (dizziness)    CLONAZEPAM (KLONOPIN) 2 MG ORAL TABLET    Take 1 Tablet (2 mg total) by mouth Every night    HYDROCODONE-ACETAMINOPHEN (NORCO) 10-325 MG ORAL TABLET    Take 1 Tablet by mouth Twice per day as needed for Pain    IPRATROPIUM-ALBUTEROL 0.5 MG-3 MG(2.5 MG BASE)/3 ML SOLUTION FOR NEBULIZATION    Take 3 mL by nebulization Every 4 hours as  needed for Wheezing    LEVOTHYROXINE (SYNTHROID) 75 MCG ORAL TABLET    Take 1 Tablet (75 mcg total) by mouth Every morning Unsure of dose    MECLIZINE (ANTIVERT) 12.5 MG ORAL TABLET    Take 1 Tablet (12.5 mg total) by mouth Every 8 hours as needed for Dizziness    NEBIVOLOL (BYSTOLIC) 10 MG ORAL TABLET    Take 1 Tablet (10 mg total) by mouth Daily    OMEPRAZOLE (PRILOSEC) 20 MG ORAL CAPSULE, DELAYED RELEASE(E.C.)    Take 1 Capsule (20 mg total) by mouth Daily    PANTOPRAZOLE (PROTONIX) 40 MG ORAL TABLET, DELAYED RELEASE (E.C.)    Take 1 Tablet (40 mg total) by mouth Once a day    PROMETHAZINE (PHENERGAN) 25 MG ORAL TABLET    Take 1 Tablet (25 mg total) by mouth Every 6 hours as needed for Nausea/Vomiting    TRAZODONE (DESYREL) 50 MG ORAL TABLET    Take 1 Tablet (50 mg total) by mouth Every night       PSH reviewed and negative.         Social History     Tobacco Use  Smoking status: Never    Smokeless tobacco: Never   Substance Use Topics    Alcohol use: Never       No family history on file.    Allergies   Allergen Reactions    Cephalexin  Other Adverse Reaction (Add comment)     unknown    Hctz [Hydrochlorothiazide]  Other Adverse Reaction (Add comment)     unknown    Iodine     Macrodantin [Nitrofurantoin]  Other Adverse Reaction (Add comment)     unknown    Minocin [Minocycline]  Other Adverse Reaction (Add comment)     unknown         Above history reviewed with patient. Changes are as documented.    Physical Exam  Constitutional: GCS 15.  HENT:   Head: No external signs of trauma.  Mouth/Throat: Midface stable.  No malocclusion.  Eyes: EOMI. Pupils are 3 mm, round and reactive bilaterally.  Ears: TMs are intact bilaterally.  No hematomas.  Nose:  No nasal septal hematoma.  No gross deformity.  Neck:  No midline C-spine tenderness. No step-offs.   Cardiovascular: RRR. Pulses present in all 4 extremities.   Pulmonary/Chest: BS equal bilaterally. No tenderness or ecchymosis.  Abdomen: Mild tenderness to  palpation in LLQ. No ecchymosis.     Musculoskeletal:   Pelvis: Known fractures.  Back: No midline tenderness. No step-offs.   Extremities:  No gross deformities.  No TTP.  GU: No gross blood on external inspection.  Skin: No laceration. No abrasion.   Neuro: No focal neurological deficits. GCS as above.  Psych: Normal mood and affect.        Labs:  Labs Ordered/Reviewed   PTT (PARTIAL THROMBOPLASTIN TIME) - Abnormal; Notable for the following components:       Result Value    APTT 23.1 (*)     All other components within normal limits    Narrative:     Therapeutic range for unfractionated heparin is 60-100 seconds.   BLOOD GAS W/ CO-OX, LYTES, LACTATE REFLEX - Abnormal; Notable for the following components:    HEMOGLOBIN 10.6 (*)     HEMATOCRITRT 32 (*)     SODIUM 134 (*)     LACTATE 2.1 (*)     All other components within normal limits    Narrative:     Manufacturer does not recommend venous sample for assessment of patient oxygenation status. A reference range for pO2, Oxyhemoglobin and Oxygen Saturation is provided but abnormal results will not flag in Epic.   BASIC METABOLIC PANEL - Abnormal; Notable for the following components:    CALCIUM 8.3 (*)     GLUCOSE 127 (*)     BUN/CREA RATIO 23 (*)     All other components within normal limits   CBC WITH DIFF - Abnormal; Notable for the following components:    HGB 10.2 (*)     HCT 33.0 (*)     MCH 25.9 (*)     MCHC 30.9 (*)     RDW-CV 16.0 (*)     PLATELETS 120 (*)     All other components within normal limits    Narrative:     Mean Platelet Volume - Not Reported  Platelet Count -Results reviewed.   LACTIC ACID - FIRST REFLEX - Abnormal; Notable for the following components:    LACTIC ACID 3.1 (*)     All other components within normal limits   PT/INR - Normal    Narrative:  In the setting of warfarin therapy, a moderate-intensity INR goal range is 2.0 to 3.0 and a high-intensity INR goal range is 2.5 to 3.5.    INR is ONLY validated to determine the level of  anticoagulation with vitamin K antagonists (warfarin). Other factors may elevate the INR including but not limited to direct oral anticoagulants (DOACs), liver dysfunction, vitamin K deficiency, DIC, factor deficiencies, and factor inhibitors.   TROPONIN-I (FOR ED ONLY) - Normal   CBC/DIFF    Narrative:     The following orders were created for panel order CBC/DIFF.  Procedure                               Abnormality         Status                     ---------                               -----------         ------                     CBC WITH DIFF[[id number]]                Abnormal            Final result                 Please view results for these tests on the individual orders.   ETHANOL, SERUM/PLASMA   TRAUMATIC BRAIN INJURY(TBI) - VALIDATION ONLY   URINALYSIS, MACROSCOPIC AND MICROSCOPIC W/CULTURE REFLEX    Narrative:     The following orders were created for panel order URINALYSIS, MACROSCOPIC AND MICROSCOPIC W/CULTURE REFLEX.  Procedure                               Abnormality         Status                     ---------                               -----------         ------                     URINALYSIS, MACROSCOPIC[[id number]]                                                     URINALYSIS, MICROSCOPIC[[id number]]                                                       Please view results for these tests on the individual orders.   DRUG SCREEN, NO CONFIRMATION, URINE   URINALYSIS, MACROSCOPIC   URINALYSIS, MICROSCOPIC   EXTRA TUBES    Narrative:     The following orders were created for panel order EXTRA  TUBES.  Procedure                               Abnormality         Status                     ---------                               -----------         ------                     DARK GREEN BMWU[[id number]]                                  In process                   Please view results for these tests on the individual orders.   DARK GREEN TUBE   TEG, RAPID GLOBAL WITH LYSIS (TRAUMA)   LACTIC ACID -  SECOND REFLEX   EXTRA TUBES    Narrative:     The following orders were created for panel order EXTRA TUBES.  Procedure                               Abnormality         Status                     ---------                               -----------         ------                     LAVENDER TOP VOZD[[id number]]                                In process                   Please view results for these tests on the individual orders.   LAVENDER TOP TUBE   H & H   H & H   H & H   LACTIC ACID LEVEL W/ REFLEX FOR LEVEL >2.0   TROPONIN-I   TROPONIN-I   TYPE AND SCREEN   CROSSMATCH RED CELLS - UNITS   CROSSMATCH RED CELLS - UNITS   ABO & RH   PRODUCT: CRYOPRECIPITATE - UNITS         Radiology:  Results for orders placed or performed during the hospital encounter of 08/28/23   XR CHEST AP MOBILE     Status: None    Narrative    Fynn L Kusek  Female, 89 years old.    XR AP MOBILE CHEST performed on 08/28/2023 9:16 AM.    REASON FOR EXAM:  Chest Trauma    TECHNIQUE: 1 views/1 images submitted for interpretation.    COMPARISON:  January 17, 2023      Impression    There are shallow lung volumes. There is prominence of the cardiomediastinal silhouette. There is aortic atherosclerosis. No  pneumothorax seen. No gross displaced fracture identified.   XR PELVIS     Status: None    Narrative    Chyann L Dickison  Female, 89 years old.    XR PELVIS performed on 08/28/2023 9:16 AM.    REASON FOR EXAM:  Pelvic Injury    TECHNIQUE: 1 views/1 images submitted for interpretation.    COMPARISON:  None      Impression    Moderate degenerative changes and demineralization noted. No displaced fracture identified.   CT TRAUMA CHEST ABDOMEN PELVIS WO IV CONTRAST     Status: None    Narrative    Vernadine L Pearce  Female, 89 years old.    CT TRAUMA CHEST ABDOMEN PELVIS WO IV CONTRAST performed on 08/28/2023 9:34 AM.    REASON FOR EXAM:  Trauma    ADDITIONAL CLINICAL INFORMATION: Trauma,    TECHNIQUE: Axial images of the chest, abdomen and pelvis  were obtained without the administration of intravenous contrast. Then coronal and sagittal reformats were produced.    RADIATION DOSE: 432.13 (mGy.cm)    COMPARISON: CT of abdomen and pelvis 03/29/2022, CTA of chest, abdomen and pelvis 12/16/2014    FINDINGS:     CHEST:    LUNGS: There is subsegmental atelectasis in the right upper lobe. Trace bilateral pleural effusions and associated basilar atelectasis. Similar noncalcified pleural thickening at the bilateral apices compared to 12/16/2014 study. No focal consolidation, or pneumothorax. Central airways are clear.    HEART/MEDIASTINUM: Severe atherosclerotic calcifications of the coronary arteries, mitral annulus and tricuspid leaflets. The heart is normal in size without pericardial effusion. The ascending aorta and pulmonary trunk are normal caliber. No enlarged lymph nodes in the mediastinum or hila.    BONES/SOFT TISSUES: Acute posterior right 11th rib fracture. Healed left lateral seventh rib fracture. Multilevel cervical spondylosis. No soft tissue masses or edema in the thoracic wall.      ABDOMEN/PELVIS:     LIVER: Large simple fluid attenuating homogeneous right hepatic lesion measuring up to 4.1 cm is stable in size from 03/29/2022 study. The liver is homogeneously attenuating.    BILIARY SYSTEM/GALLBLADDER: The gallbladder is nondistended without biliary duct dilation.    PANCREAS: The pancreas is homogeneously attenuating without pancreatic duct dilation.    KIDNEY/URETERS/BLADDER: The kidneys are atrophic bilaterally. There is mildly increased nonspecific perinephric stranding. No hydronephrosis or renal calculi. The bladder is decompressed by Foley catheter.    ADRENALS: Adrenals are unremarkable.    SPLEEN: Spleen is unremarkable.    BOWEL: No abnormally dilated loops of bowel concerning for obstruction. The abdominal aortic aneurysm is compressing on the duodenum posteriorly.    VASCULATURE/LYMPH NODES: The infrarenal fusiform abdominal aortic aneurysm  now measures 5.3 cm from 4.9 cm in greatest coronal diameter from 03/29/2022 study. Severe atherosclerotic calcifications of the abdominal aorta. No enlarged lymph nodes of the abdomen or pelvis.    REPRODUCTIVE ORGANS: The uterus is appropriate size for age there are calcifications in the posterior uterus.    PERITONEAL CAVITY: There is complex hyperattenuating fluid in the soft tissues of the right pelvis measuring approximately 5.8 x 11.9 x 9.7 cm likely representing blood products. There is extension into the right retroperitoneal space.    BONES/SOFT TISSUES: There is a fracture at the left anterior acetabulum as well as the left inferior ramus. Sclerosis of the mid thoracic and lumbar vertebral bodies without endplate deformity or acute fracture. No soft tissue edema or mass in the abdominal and pelvic walls.  Impression    1. Complex hyperattenuating fluid collection in the right pelvis which tracks into the right retroperitoneal area likely representing hematoma.  This noncontrasted study is does not assess for active bleeding well. Could consider CTA of concern for active bleeding.  2. Left puboacetabular junction and left inferior ramus fractures.  3. Acute right posterior 11th rib fracture.  4. Progressed infrarenal fusiform abdominal aortic aneurysm now measuring 5.3 cm from 4.9 cm compared to 03/29/2022 study.    Discussed critical findings with Feliberto Harts. MD at 10:40 AM on 08/28/2023    A Critical Document Only message has been documented for [facility] in the [employer] Actionable Findings system on 08/28/2023 10:40 AM, Message ID [id number].   XR PELVIS JUDET VIEWS BILATERAL     Status: None    Narrative    Savina L Maddix  Female, 89 years old.    XR PELVIS JUDET VIEWS BILATERAL performed on 08/28/2023 10:14 AM.    REASON FOR EXAM:  Trauma, r/o fracture/dislocation    TECHNIQUE: 3 views/3 images submitted for interpretation.    COMPARISON:  CT chest, abdomen and pelvis  08/28/2023    FINDINGS:  Subtle lucent line in the left inferior pubic ramus likely correlating to known fracture. Irregularity in the left puboacetabular junction also likely correlates to known fracture. The pelvic ring appears intact. No other acute displaced fractures are appreciated. The bilateral SI joints are preserved. No pubic diastasis.      Impression    1. Redemonstration of left inferior pubic ramus and left puboacetabular junction fractures. Better appreciated on prior CT.  2. No other acute displaced fractures of the pelvis are appreciated   XR PELVIS INLET OUTLET VIEWS     Status: None    Narrative    Arriana L Kreuzer  Female, 89 years old.    XR PELVIS INLET OUTLET VIEWS performed on 08/28/2023 10:14 AM.    REASON FOR EXAM:  Trauma, r/o fracture/dislocation    TECHNIQUE: 2 views/2 images submitted for interpretation.    COMPARISON:  CT of chest abdomen and pelvis 08/28/2023    FINDINGS:  The pelvic ring appears intact. No pubic symphysis diastasis. The previously noted left inferior ramus and anterior acetabular fractures are now well appreciated on these images. The bowel gas obscures the sacrum.      Impression    1. Known pelvic fractures are better appreciated on prior CT.  2. The pelvic ring appears intact without significantly displaced acute fractures.       EKG:  New left bundle-branch block, no Sgarbossa criteria    Nursing notes/chart reviewed.      Course and Medical Decision Making    Patient was paged as a priority 2 trauma.  Trauma team was present upon arrival of patient to the ED.  Primary, secondary, and tertiary surveys were performed and appropriate imaging was ordered.  FAST: Negative.  Hypotension, 80s over 40s, ordered 0.5 L fluid.  Ketamine given pain control  Blood pressure improved to the 100s over 60s.  Repeat blood pressure 70s over 40s.  Concern for hemorrhagic shock.  Ordered blood transfusion 2 units, match for 4.  Consent obtained from knee/MPOA.  Repeat ketamine for  pain control         Admit to SICU      Admitted        Clinical Impression   Acetabular fracture (CMS HCC) (Primary)   Pelvic hematoma in female  I am scribing for, and in the presence of, Lanelle Bal, MD, for services provided on 08/28/2023  Karl Bales, SCRIBE    // Karl Bales, SCRIBE  08/28/2023, 09:06    I personally performed the services described in this documentation, as scribed  in my presence, and it is both accurate  and complete.    Lanelle Bal, MD  Lanelle Bal, MD        Parts of this patient's chart were completed in a retrospective fashion due to simultaneous direct patient care activities in the Emergency Department.

## 2023-08-28 NOTE — Consults (Signed)
 [facility]  Supportive/Palliative Care Medicine Consult    Date of Service:  08/28/23  Requesting MD: Kerrin Champagne, MD  Reason for Consult:  Goals of care clarification  Assessment & Recommendations:  Palliative medicine consult secondary to failure to thrive in the setting of fall with multiple fractures and hemorrhagic shock.    The primary team contacted the patient's niece/POA, Tricia Bailey, and discussed current medical problems and proceeded with comfort measures only. I contacted Tricia Bailey and confirmed previous discussion. She wants Tricia Bailey to be comfortable without  taking aggressive procedures or interventions.    PLAN:  -CMO  -DNR/DNI  -Treatment Limitations: No escalation in care, no transfusions, no vasopressors, no laboratory testing, no additional diagnostic imaging, no BiPAP/HFNC, no tube feedings.  -Morphine 2 mg IV Q 1 hours PRN for pain/SOB  -Lorazepam 0.5 mg IV Q 2 hours PRN for agitation  -Robinul 200 mcg IV Q 6 hours PRN for excess secretions  -Tylenol 650 mg PR Q 6 hours per fever    Decision Making Capacity:  No    Advance Directives prior to consultation:  MPOA- Yes, Name: Tricia Bailey       Information Obtained from: relative and history reviewed via medical record  HPI/Discussion:  89 y/o female with a PMH COPD, HTN, GERD, hypothyroidism, who was admitted on 08/28/23 s/p fall and b/l acetabular fracture, left pelvic ramus fracture, right rib fracture, extraperitoneal hemorrhage and shock. Given the extension of the trauma and hemorrhagic shock, the family decided CMO.    Past Family, Medical, and Social History and ROS see H&P on 08/28/23 by Dr. Kerrin Champagne    Pt/Family unit dynamics: Pt has a close family  Spiritual Assessment:  Spiritual    Medical orders prior to consultation: Post - POST form not discussed DNR Card - No  Code Status:  No CPR  Treatment Limitations prior to consultation:  No antibiotics, No bipap, No dialyisis, No hyperalimentation, No IV fluid, No lab  studies, No routine diagnositc tests, No supplemental oxygen, No transfer to ICU/Stepdown, No tube feedings, No vasopressors, No x-rays, and Do not intubate        Exam:  Temperature:  (Unable to obtain to temp)  Heart Rate: 61  BP (Non-Invasive): 129/69  Respiratory Rate: 19  SpO2: 97 %  General: In non acute distress  HEENT: Normocephalic. Conjunctivae pink, nasal mucosa normal, mucous membranes moist.  NECK:   Non-tender to palpation  PULM:   Decreased air entry b/l  CV:   Regular rate and rhythm  NEURO: Sleepy and unable to follow commands.      Labs:          Imaging Studies:  Images and Reports reviewed to current date.    Consultant:  Alford Highland, MD    Total Time of Encounter:  55 minutes    Alford Highland, MD

## 2023-08-28 NOTE — ED Nurses Note (Signed)
 Pt following commands and answering questions appropriately for ED resident. She is profoundly hard of hearing however.

## 2023-08-28 NOTE — Nurses Notes (Addendum)
 Patient family member reported that she was still moaning in pain. Another 2mg  of morphine given. Per the pts family member request BP was checked and was 59/30 after the administration of the PRN med.

## 2023-08-28 NOTE — ED Nurses Note (Signed)
 Report given to receiving RN. Patient to STAT CTA prior to going to EICU per verbal order from Lacie Draft, MD given concern for active internal bleeding. Verbal order from same provider for 50mg  diphenhydramine IV and 200mg  hydrocortisone IV. Given per [facility]. Patient to EICU after completion of scan with RN escort.

## 2023-08-28 NOTE — Progress Notes (Signed)
 Trauma/Surgical Critical Care/Acute Care Surgery Staff  Patient with signs of hemorrhagic shock.  Transfusing blood.  Has adequate PIV access for blood resuscitation.  Transfusing one unit of blood emergently.  TEG is in process.  She is hypotensive to 70/40's and quite pale.  Risk of contrast is very much outweighed by the risk of dying from hemorrhagic shock in this patient, have thus ordered STAT CTA Abdomen and pelvis.  I have coordinated care with Dr. Noel Gerold in SICU regarding ongoing resuscitation.    Electronically Signed by:    Harden Mo, DO, FACS, FCCM  Professor of Surgery  Trauma, Surgical Critical Care, and Acute Care Surgery  08/28/2023 12:45

## 2023-08-28 NOTE — ED Nurses Note (Addendum)
 Patient grimacing and grabbing at her pubic area. Also repeatedly asking for her urinary catheter to be removed. Pt has had only 50mL of urine output since 0700. Bladder scan performed. Could not identify bladder on scanner suggesting catheter is draining appropriately. Favor pelvic fractures as the source of this patient's pain. Will notify MD.

## 2023-08-28 NOTE — Nurses Notes (Signed)
 Pt moaning and seems agitated 2mg  Prn morphine given.

## 2023-08-28 NOTE — Progress Notes (Incomplete)
 54F with hemorrhagic shock due to pelvic hemorrhage demonstrated on CTA with active extrav which was obtained due to hypotension in ED. Planned to place arterial line and initiated blood transfusion, however patient's family contacted by phone stated she has been saying she wants to die and therefore wish

## 2023-08-28 NOTE — H&P (Signed)
 I have seen and evaluated this patient. Refer to H&P and progress notes for more detailed assessment and plan.  Merri Ray, MD  08/28/2023, 10:46

## 2023-08-28 NOTE — H&P (Cosign Needed)
 [facility]  Trauma History & Physical      Tricia Bailey, Tricia Bailey, 89 y.o. female  Date of Birth:  1924/06/13  Encounter Start Date:  (Not on file)  Inpatient Admission Date:      Attending Physician: Merri Ray, MD    HPI:     Trauma Level Alert: P2  Arriving from: [facility]     Pre-Hospital Report:  Time of Injury: last night   Patient Condition: Stable  Glasgow Coma Scale: Eye opening: 4 spontaneous, Verbal resonse:  5 oriented, Best motor response:  6 obeys commands  Intubated: No   Fluids Received in Route: No fluids recieved  Loss of Consciousness: Amnestic to event    Mechanism of Injury:   Fall    Arrival to [facility]:  Information Obtained from: EMS  Chief Complaint: Pelvic pain  Additional HPI Details:      Tricia Bailey is a 89 y.o. female PMH of COPD, HTN, GERD, hypothyroidism presents as a P2 Trauma page transfer for a fall. EMS reports that the patient fell last night.-unclear where or events surrounding the fall. No LOC. Per report, patient initially presented to [facility] where her CT imaging showed "bilateral acetabulum fractures, left pubic ramus fracture, and extra peritoneal hemorrhage". No blood thinners. Patient was transferred to [facility] ED for further care. VSS en route. Patient received 1 mg of Morphine en route. GCS 15 on arrival, however, hypotensive with pressures in 80/40's. 0.5 L bolus given which improved pressures initially, but continued to be hypotensive in 70/40's.        ROS:  MUST comment on all "Abnormal" findings   ROS Other than ROS in the HPI, all other systems were negative.      PAST MEDICAL/ FAMILY/ SOCIAL HISTORY:       Past Medical History:   Diagnosis Date    Arthropathy     COPD (chronic obstructive pulmonary disease) (CMS HCC)     GERD (gastroesophageal reflux disease)     HTN (hypertension)          Tetanus: Unknown   Cannot display prior to admission medications because the patient has not been  admitted in this contact.           Allergies   Allergen Reactions    Cephalexin  Other Adverse Reaction (Add comment)     unknown    Hctz [Hydrochlorothiazide]  Other Adverse Reaction (Add comment)     unknown    Iodine     Macrodantin [Nitrofurantoin]  Other Adverse Reaction (Add comment)     unknown    Minocin [Minocycline]  Other Adverse Reaction (Add comment)     unknown             Social History     Tobacco Use    Smoking status: Never    Smokeless tobacco: Never   Vaping Use    Vaping status: Never Used   Substance Use Topics    Alcohol use: Never    Drug use: Never           PHYSICAL EXAMINATION: MUST comment on all "Abnormal" findings    INITIAL VITALS:    EXAM:    GEN:   NAD  HEENT: Normocephalic; atraumatic pupils equal, round and reactive to light; ,  extraocular movements are intact., and Conjunctivae pink, nasal mucosa normal, mucous membranes moist.  NECK:   Non-tender to palpation  PULM:   Lung sounds clear  to auscultation bilaterally with equal chest expansion  CV:   Regular rate and rhythm  CHEST::External examination non-tender to palpation.  ABD:   Abdomen soft, mildly tender in LLQ, and nondistended.  PELVIS: Non-tender to compression /palpation  BACK:  No evidence of injury  MS: Atraumatic.    NEURO:  Alert and oriented to person, place and time.     GLASGOW COMA SCALE: Eye opening: 4 spontaneous, Verbal resonse:  5 oriented, Best motor response:  6 obeys commands  VASCULAR:  All pulses palpable and equal bilaterally  INTEGUMENTARY:  Pink, warm, and dry  PSYCHOSOCIAL: Pleasant.  Normal affect.      DIAGNOSTIC STUDIES: Comment on "Positives"   ECG 12-LEAD  Result Date: 08/28/2023  Normal sinus rhythm Left axis deviation Left bundle branch block Abnormal ECG When compared with ECG of 28-Aug-2023 09:22, No significant change was found    ECG 12-LEAD  Result Date: 08/28/2023  Sinus rhythm with 1st degree A-V block Left axis deviation Left bundle branch block Abnormal ECG No previous ECGs  available    XR PELVIS  Result Date: 08/28/2023  Tricia Bailey Female, 89 years old. XR PELVIS performed on 08/28/2023 9:16 AM. REASON FOR EXAM:  Pelvic Injury TECHNIQUE: 1 views/1 images submitted for interpretation. COMPARISON:  None     Moderate degenerative changes and demineralization noted. No displaced fracture identified.    XR CHEST AP MOBILE  Result Date: 08/28/2023  Tricia Bailey Female, 89 years old. XR AP MOBILE CHEST performed on 08/28/2023 9:16 AM. REASON FOR EXAM:  Chest Trauma TECHNIQUE: 1 views/1 images submitted for interpretation. COMPARISON:  January 17, 2023     There are shallow lung volumes. There is prominence of the cardiomediastinal silhouette. There is aortic atherosclerosis. No pneumothorax seen. No gross displaced fracture identified.     Patient Management:       Consults Ordered   No orders of the defined types were placed in this encounter.      Assessment & Plan/Recommendations:    There are no active hospital problems to display for this patient.      Plan:  Admit to TES under the service of  Kerrin Champagne, DO  to ICU      left acetabulum fracture, left pubic ramus fracture, R pelvic hematoma c/f active bleed  -will obtain stat CTA pelvis per SICU        -IR also paged   -ortho consulted, appreciate recs    Acute right posterior 11th rib fracture   -rib fracture protocol     New LBBB  -repeat EKG showing new LBBB  -trops WNL     Chronic conditions:  -COPD--> restart home inhalers.   -HTN-Hold home Norvasc  -GERD-restart PPI   -hypothyroidism- restart home synthroid     Incidental: CT A/P showing infrarenal fusiform abdominal aortic aneurysm now measuring 5.3 cm from 4.9 cm compared to 03/29/2022 study     Diet: NPO  DVT ppx: holding  Dispo: Admit to SICU due suspected hemorrhagic shock     Tricia Royalty, PA-C

## 2023-08-28 NOTE — ED Nurses Note (Signed)
 1mg  morphine given en route at 0846 per EMS report

## 2023-08-28 NOTE — Progress Notes (Signed)
 Interval Update     Spoke with MPOA Olegario Messier via phone. Reviewed acute conditions with hemorrhagic shock secondary to pelvic bleeding in the setting of pelvic fractures. Reviewed DNR/DNI that was discussed in the ED which MPOA confirms. Discussed options of aggressive medical interventions including Blood Transfusion, Intervention Radiology consult, Orthopedic consultation. MPOA Olegario Messier shared that patient has been stating she is " ready to die for months" she stated that patient initially yesterday refused coming but eventually agreed due to pain. MPOA stated that patient would not want aggressive measures and would just want to be kept comfortable. Writer discussed that in the absence of aggressive intervention including blood transfusion and measures to control hemorrhage patient likely could worsen and lead to death in the next minutes to hours. Discussed comfort measures only which daughter stated that would be patient's goals of care. MPOA understood risks and verified comfort measures only at this time. Full limitations discussed below, and MPOA in agreement with escalating medications for symptom management, and supportive care consultation for symptom management.     Goals of Care    - MPOA verified and reviewed Olegario Messier Niece   - DNR/DNI   - Comfor Measures [place]    - No surgical or procedural intervention    - No Blood Transfusions   - Treatment Limitations: No escalation in care, no vasopressors, no laboratory testing, no additional diagnostic imaging, no BiPAP/HFNC, No tube feedings   - Transfer to Floor    - Symptom Management     - Opioids as need for pain and dyspnea     - Ativan for agitation     - Supportive care consultation for symptom management    - Family en route to hospital discussed prognosis of minutes to hours given hypotension, family wishing to proceed with CMO at this time    - SICU team at bedside at conclusion of discussion     Loney Loh, APRN,FNP-BC\    Trauma/Surgical Critical Care  Staff  Discussed treatment plan with midlevel  Plan developed together  Was in midst of bedside procedure in SICU during this conversation  Electronically Signed by:    Harden Mo, DO, FACS  Professor of Surgery  Trauma, Surgical Critical Care, and Acute Care Surgery  [facility]  08/28/2023 14:41

## 2023-08-28 NOTE — Ancillary Notes (Cosign Needed)
 [facility]  Spiritual Care Note    Patient Name:  Tricia Bailey  Date of Encounter:   08/28/2023    I responded to a request for a chaplain from RN Sarah for bed 4 at [facility]. [name] transitioned to CMO. No family present. [name] was awake and participated in conversation in the beginning of our visit. I prayed with her. I inquired of her favorite hymns. I provided hymnal songs to her. [name] listened to hymns playing. I sat with her for a while. I let the nurse know to reach out if a chaplain is needed to sit with her.      08/28/23 1420   Clinical Encounter Type   Reason for Visit Patient/Person/Family Request   Referral From RN/LPN  (RN Maralyn Sago)   Declined Spiritual Care Visit At This Time No   Visited With Patient   Patient Spiritual Encounters   Spiritual Needs/Issues Actively Dying   Spiritual/Coping Resources Beliefs in God/Sacred/Higher Purpose;Loved by God/Sacred   End-of-Life Issues Anticipated Death   Coping Provided music/song;Provided supportive presence;Offered empathy;Relationship Probation officer;Worship   Other Hydrologist Provided Non-anxious presence   Spiritual Care outcomes with Patient   Spiritual/Emotional Processing  Spiritual Care relationship established    Patient Coping No change   Coping/Psychosocial   Observed Emotional State calm;cooperative   Verbalized Emotional State acceptance   Coping/Psychosocial Response Interventions   Family/Support System Care support provided   Trust Relationship/Rapport emotional support provided;reassurance provided;thoughts/feelings acknowledged   Time of Encounters   Start Time 1330   Stop Time 1420   Duration (minutes) 50 Minutes     Mahala Menghini, CHAPLAIN RESIDENT  Pager: [phone]  Total Time of Encounter: 55 min.

## 2023-08-28 NOTE — Nurses Notes (Signed)
 When rolling patient, she appears to be grimacing and in pain. PRN Morphine 2 mg given at this time.

## 2023-08-28 NOTE — H&P (Signed)
 [facility]  Brief SICU Note    Attending: Dr. Noel Gerold  Date: 08/28/2023    Patient was brought from the emergency department up to [facility].  Patient was transferred to ICU bed and emergency whole blood was ordered due to change in mental status and suspected hemorrhagic shock.  Prior to transfusing blood, placing art line, or any other invasive procedures, discussed with patient's family/MPOA regarding code status.  After discussion with MPOA, they decided to pursue comfort measures for the patient.  Patient was not transfused any blood.  Discussed with the primary team who transferred patient to floor status.    Ellard Artis, MD   08/28/2023 15:07  Orthopaedic Surgery, PGY-1    ______________________________________________________________________________  SICU Attending    I personally saw and examined the patient.  I reviewed the mid-level/resident's note.  I agree with the findings and plan of care as documented in the their note. The date of the signature is simply the date the note was electronically signed , the patient was seen on the date of service indicated in the note.     Any exceptions/additions are edited/noted, my findings/participation:    53F s/p fall with pelvic fx and hemorrhagic shock due to pelvic hemorrhage demonstrated on CTA with active extrav which was obtained due to hypotension in ED. Seen on arrival to SICU. Awake but confused, SBP > 100, normocardic. IR paged by trauma. Planned to place arterial line and initiate blood transfusion, however patient's family contacted by phone by trauma team who were also at bedside and stated she would not want aggressive measures and want to transition to CMO.      Critical Care Attestation    I was present at the bedside of this critically ill patient for 35 minutes exclusive of procedures.  This patient suffers from failure or dysfunction of Cardiovascular and Hematologic system(s).  The care of this patient was in regard to  managing (a) conditions(s) that has a high probability of sudden, clinically significant, or life-threatening deterioration and required a high degree of Attending Physician attention and direct involvement to intervene urgently. Data review and care planning was performed in direct proximity of the patient, examination was performed in direct contact with the patient. All of this time was exclusive of procedure which will be documented elsewhere in the chart.    My critical care time is independent and unique to other providers (no other providers saw patient for purposes of SICU evaluation)  My critical care time involved full attention to the patient's condition and included:    Review of nursing notes and/or old charts  Review of medications, allergies, and vital signs  Documentation time  Consultant collaboration on findings and treatment options  Care, transfer of care, and discharge plans  Ordering, interpreting, and reviewing diagnostic studies/lab tests  Obtaining necessary history from family, EMS, nursing staff and/or treating physicians    My critical care time did not include time spent teaching resident physician(s) or other services of resident physicians, or performing other reported procedures.  Total Critical Care Time: 35 minutes    Melanee Spry, MD

## 2023-08-28 NOTE — Care Management Notes (Signed)
 [facility]  Care Management Note    Patient Name: Tricia Bailey  Date of Birth: 09/06/1924  Sex: female  Date/Time of Admission: 08/28/2023  9:08 AM  Room/Bed: ERTR2/ TR2  Payor: AETNA-MEDI-ADV / Plan: [employer] MEDICARE ADVANTAGE PPO / Product Type: PPO /    LOS: 0 days   Primary Care Providers:  Pcp, No (General)    Admitting Diagnosis:  P2 [place] / Fall    Assessment:   MSW reviewed patient's chart as patient admitted as level 2 trauma. Patient is documented to be GCS 15 alert and oriented. Will follow.        The patient will continue to be evaluated for developing discharge needs.     Case Manager: Lavonia Dana, [place]  Phone: [phone]

## 2023-08-28 NOTE — Nurses Notes (Signed)
 Pt is grunting and appears agitated. PRN Ativan 0.5 mg given at this time.

## 2023-08-28 NOTE — Nurses Notes (Signed)
 Pt admitted to the unit via transport to 7W 701. Pt not alert, but appears comfortable. No use of accessory muscles. No signs of facial grimacing. No need for PRN medications at the time. Care channel on to provide comfort. Volunteer is at bedside. Call bell within reach. Bed in the lowest position, wheels locked. Foley intact draining clear yellow. 2 PIVs - infusion cap intact. Patient unable to verbalize needs at this time. Will continue to monitor.

## 2023-08-28 NOTE — ED Nurses Note (Signed)
 Patient more restless and trying to get OOB d/t pain. Sitter select applied and alarms audible. Will contact MD for analgesia orders.

## 2023-08-28 NOTE — ED Nurses Note (Signed)
 MD notified of hypotension - to bedside. Patient assessment unchanged. Morphine held and orders received for NS bolus and pain-dose ketamine.

## 2023-08-28 NOTE — ACP (Advance Care Planning) (Signed)
 Does the Patient have an Advance Directive?: Yes, Patient Does Have Advance Directive for Healthcare Treatment  Type of Advance Directive Completed: Medical Power of Attorney  Copy of Advance Directives in Chart?: Yes, Copy on Chart.(Specify in Comment Which Advance Directive)  Name of MPOA or Healthcare Surrogate: Tricia Bailey  Phone Number of MPOA or Healthcare Surrogate: [phone]

## 2023-08-28 NOTE — Consults (Incomplete)
 Department of Orthopaedics  H&P / Consult Note  Attending: {Ortho Attending:48249}  Ortho Service: {Ortho Service:48248}      Identification:  Name: Tricia Bailey  Age and Gender: 89 y.o. female  Date of Birth: 1925-01-27  Date of Admission: 08/28/2023  MRN: [id number]  PCP: No Pcp  Requesting Service: {JP Requesting XBMW:41324}    Reason for Consult:   ***    History Limitations: {History Limitations-HF:30643::"None"}     HPI:   Tricia Bailey is a 89 y.o. ***-hand dominant female  *** s/p *** with ***. ***. Immediate *** pain and deformity***.    ***Presented directly to [facility]. Imaging revealed the above injury. Ortho consulted.***    ***Went to outside facility (***). Imaging revealed the above injury. Received abx (***)*** and tetanus update***. Transferred to [facility]. Ortho consulted.***    Currently complains of *** pain. Denies any***Also complains of*** numbness/tingling of involved extremity***. Denies any N/V/D, fevers or chills, pain or paraesthesias in unaffected extremities, any other pain/injury, LOC, CP or SOB.     Community ambulator prior to injury***.    No prior *** injury or surgery.    Antibiotics Administered: {ER antibiotics:38694} at ***  Tetanus Status: {Tetanus Status:38693}  NPO Time: Last PO intake was ***.   Anticoagulation: No***Home blood thinner use***.      PMH:  ***  No history of bleeding or clotting disorders ***  No pertinent cardiac***, renal ***, or pulmonary history ***  Immunization History   Administered Date(s) Administered    Tetanus,Diptheria,Pertussis(BOOSTRIX) 03/29/2022     Past Medical History:   Diagnosis Date    Arthropathy     COPD (chronic obstructive pulmonary disease) (CMS HCC)     GERD (gastroesophageal reflux disease)     HTN (hypertension)            PSH:  ***  {denies/reports:30956} previous problems with anesthesia.  ***  No prior orthopaedic surgery on affected limb***  {Past Surgical History Negative:25851}        Allergies:  ***  Allergies   Allergen  Reactions    Cephalexin  Other Adverse Reaction (Add comment)     unknown    Hctz [Hydrochlorothiazide]  Other Adverse Reaction (Add comment)     unknown    Iodine     Macrodantin [Nitrofurantoin]  Other Adverse Reaction (Add comment)     unknown    Minocin [Minocycline]  Other Adverse Reaction (Add comment)     unknown         Medications:  Anticoagulation = ***  ***  Prior to Admission Medications   Prescriptions Last Dose Informant Patient Reported? Taking?   HYDROcodone-acetaminophen (NORCO) 10-325 mg Oral Tablet  Care Giver Yes No   Sig: Take 1 Tablet by mouth Every 8 hours as needed for Pain   albuterol sulfate (PROVENTIL OR VENTOLIN OR PROAIR) 90 mcg/actuation Inhalation HFA Aerosol Inhaler   Yes No   Sig: Take 1-2 Puffs by inhalation Every 6 hours as needed   amLODIPine (NORVASC) 2.5 mg Oral Tablet  Care Giver Yes No   Sig: Take 1 Tablet (2.5 mg total) by mouth Once a day   clonazePAM (KLONOPIN) 0.5 mg Oral Tablet, Rapid Dissolve   No No   Sig: Take 1 Tablet (0.5 mg total) by mouth Every 12 hours as needed for Anxiety or Other (dizziness)   ipratropium-albuterol 0.5 mg-3 mg(2.5 mg base)/3 mL Solution for Nebulization   No No   Sig: Take 3 mL by nebulization Every 4  hours as needed for Wheezing   levothyroxine (SYNTHROID) 25 mcg Oral Tablet   Yes No   Sig: Take 1 Tablet (25 mcg total) by mouth Every morning Unsure of dose   meclizine (ANTIVERT) 12.5 mg Oral Tablet   No No   Sig: Take 1 Tablet (12.5 mg total) by mouth Every 8 hours as needed for Dizziness   nebivoloL (BYSTOLIC) 10 mg Oral Tablet   Yes No   Sig: Take 1 Tablet (10 mg total) by mouth Twice daily   pantoprazole (PROTONIX) 40 mg Oral Tablet, Delayed Release (E.C.)   No No   Sig: Take 1 Tablet (40 mg total) by mouth Once a day   promethazine (PHENERGAN) 25 mg Oral Tablet   Yes No   Sig: Take 1 Tablet (25 mg total) by mouth Every 6 hours as needed for Nausea/Vomiting      Facility-Administered Medications: None       SH:   Tobacco = ***  Alcohol =  ***  Drugs = ***  Occupation = ***  Ambulation (prior to incident) = {JP assistive devices:49369}  Social History     Tobacco Use    Smoking status: Never    Smokeless tobacco: Never   Substance Use Topics    Alcohol use: Never       FH:  {denies/reports:30956} family history of problems with anesthesia, per patient knowledge.   No family history of bleeding or clotting disorders ***  Family Medical History:    None           ROS:   Negative, except as noted in HPI.      PHYSICAL EXAM:          Vital signs: BP 120/63   Pulse 74   Temp 36.6 C (97.9 F) Comment: thermoscan  Resp 16   SpO2 96%       Constitutional: NAD. A&Ox3***. Fair historian. *** at bedside.  Musculoskeletal:     RUE:  - Deformity: No obvious deformity appreciated***   - Skin / Wounds: Skin intact. No abrasions, lacerations, ecchymosis or, swelling noted*** No open wounds***.    - Pain: No TTP over fingers, hand, wrist, forearm, elbow, arm, clavicle. No pain with passive or active  ROM of  wrist, elbow, or shoulder  - Sensory: SILT throughout U/M/R/Ax*** nerve distributions  - Motor: Intact*** (+AIN/PIN/U),  able to give ok sign, thumbs up, finger cross, finger spread  - Finger flexors/extensors: Intact FDP, FDS, extensors all digits***  - Vascular: Palpable pulses***, 2+ Radial pulse  - Other: ***  - No gross motor / sensory deficits***  - ***       LUE:  - Deformity: No obvious deformity appreciated***  - Skin / Wounds: Skin intact. No abrasions, lacerations, ecchymosis or, swelling noted*** No open wounds***.   - Pain: No TTP over fingers, hand, wrist, forearm, elbow, arm, clavicle. No pain with passive or active  ROM of  wrist, elbow, or shoulder  - Sensory: SILT throughout U/M/R/Ax*** nerve distributions  - Motor: Intact*** (+AIN/PIN/U),  able to give ok sign, thumbs up, finger cross, finger spread  - Finger flexors/extensors: Intact FDP, FDS, extensors all digits***  - Vascular: Palpable pulses***, 2+ Radial pulse  - Other: ***  - No  gross motor / sensory deficits***  - ***       RLE:   - Deformity: No obvious deformity appreciated***   - Skin / Wounds:  Skin intact. No abrasions, lacerations, ecchymosis, or swelling noted.  No open wounds***   - Pain: No TTP over foot, ankle, leg, knee,  or thigh ***, No pain with active or passive range of motion of the ankle, knee, hip***  - Sensory: SILT throughout S/S/T/DP/SP nerve distributions  - Motor: Intact***, Able to fire +EHL/FHL***, Able to ADF/APF***  - Vascular: Palpable pulses, 2+ DP, *** 2+PT  - ABI: ***  - Other: ***  - No gross motor / sensory deficits***  - ***       LLE:  - Deformity: No obvious deformity appreciated***   - Skin / Wounds:  Skin intact. No abrasions, lacerations, ecchymosis, or swelling noted.  No open wounds***   - Pain: No TTP over foot, ankle, leg, knee,  or thigh ***, No pain with active or passive range of motion of the ankle, knee, hip***  - Sensory: SILT throughout S/S/T/DP/SP nerve distributions  - Motor: Intact***, Able to fire +EHL/FHL***, Able to ADF/APF***  - Vascular: Palpable pulses, 2+ DP, *** 2+PT  - ABI: ***  - Other: ***  - No gross motor / sensory deficits***         Pelvis:    - No pain with lateral force on pelvis    Pictures:  ***  (08/28/2023 - 09:36)    Imaging:   OSH films:   ***  Films here:  ***    Pertinent Labs:   Lab Results   Component Value Date    WBC 9.3 08/28/2023    HGB 10.2 (L) 08/28/2023    HCT 33.0 (L) 08/28/2023    PLTCNT 120 (L) 08/28/2023    INR 1.08 08/28/2023    SODIUM 134 (L) 08/28/2023    POTASSIUM 4.1 01/17/2023       Procedures:  *** (see procedure note for further details)      ASSESSMENT:  89 y.o. female s/p *** with ***      PLAN/RECOMMENDATIONS:  Please place "Ortho Consult" in Epic.***  {KH AVWU:98119}    (OR today) ***  Pre-op workup (labs***, EKG***, CXR***) ordered  ***Consented.***  ***Pending operative clearance by primary / admitting team ***    (ED Send out)***  No urgent surgical intervention at this time by  Orthopaedics.***  {lgt casts:29385} {kh cast/splint:49879} applied in ED - keep dry at all times ***  Weightbearing: {lgtextremitylist:26396} {Weightbearing options:38609}  Pain Management: Pain control per ED ***  Medication: Scripts for ***  Dispo: Ok to d/c from ED from Ortho standpoint  Educated pt on warning signs / symptoms and advised to go to the nearest ED if these appear  Follow up: F/U in clinic with Dr. Gaylord Shih Attending:48249} {Follow up options:38594} (order placed in Epic)***.      (Floor/admission)***  Admission: {JP Admission:49349}  Consults: {JP consults:49355}  Imaging: {JP imaging:49367}  Weightbearing Status: {WBing Status:39231} {JP Extremity:49348} ***  PT/OT: OOB with PT/OT after surgery***  DVT prophylaxis: {JPDVT:49347}  Antibiotics/Boosters: {JP Abx:49350}  Diet: {jpdiet:49345}  Pain Management: {JP JYNW:29562}  Will discuss with staff & plan will be updated as necessary***      Consent Statement: Written consent was obtained from the patient *** / patient's ***. This included a discussion of both operative and nonoperative options, as well as the associated risks/benefits with each option. Patient demonstrated understanding and all questions were answered.Jamey Ripa. Kennon Rounds, MD  [facility] Department of Orthopaedics, PGY-1  08/28/2023, 09:36

## 2023-08-28 NOTE — Progress Notes (Signed)
 Wrong note type

## 2023-08-28 NOTE — Nurses Notes (Addendum)
 Service at bedside. Resident on phone with mpoa. Decision to make patient comfort measures only. Order changed in computer. Chaplain paged. No blood products per service.

## 2023-08-29 DIAGNOSIS — R410 Disorientation, unspecified: Secondary | ICD-10-CM

## 2023-08-29 DIAGNOSIS — T148XXA Other injury of unspecified body region, initial encounter: Secondary | ICD-10-CM

## 2023-08-29 DIAGNOSIS — R627 Adult failure to thrive: Secondary | ICD-10-CM

## 2023-08-29 MED ORDER — PCA - MORPHINE (PF) 1 MG/ML IN 0.9% SODIUM CHLORIDE INTRAVENOUS
INJECTION | INTRAVENOUS | Status: DC
Start: 2023-08-29 — End: 2023-08-30
  Filled 2023-08-29: qty 100

## 2023-08-29 MED ORDER — LORAZEPAM 2 MG/ML INJECTION WRAPPER
2.0000 mg | INTRAMUSCULAR | Status: DC
Start: 2023-08-29 — End: 2023-08-30
  Administered 2023-08-29 (×2): 2 mg via INTRAVENOUS
  Filled 2023-08-29 (×2): qty 1

## 2023-08-29 MED ORDER — LORAZEPAM 2 MG/ML INJECTION WRAPPER
2.0000 mg | INTRAMUSCULAR | Status: DC | PRN
Start: 2023-08-29 — End: 2023-08-30

## 2023-08-29 MED ORDER — OXYCODONE 5 MG TABLET
5.0000 mg | ORAL_TABLET | ORAL | Status: DC | PRN
Start: 2023-08-29 — End: 2023-08-29

## 2023-08-29 MED ORDER — LORAZEPAM 2 MG/ML INJECTION WRAPPER
1.0000 mg | INTRAMUSCULAR | Status: DC | PRN
Start: 2023-08-29 — End: 2023-08-29
  Administered 2023-08-29 (×2): 1 mg via INTRAVENOUS
  Filled 2023-08-29 (×3): qty 1

## 2023-08-29 MED ORDER — MORPHINE 4 MG/ML INTRAVENOUS SOLUTION
6.0000 mg | INTRAVENOUS | Status: DC | PRN
Start: 2023-08-29 — End: 2023-08-30

## 2023-08-29 MED ORDER — MORPHINE 2 MG/ML INTRAVENOUS SYRINGE
2.0000 mg | INJECTION | INTRAVENOUS | Status: DC | PRN
Start: 2023-08-29 — End: 2023-08-29
  Administered 2023-08-29 (×2): 2 mg via INTRAVENOUS
  Filled 2023-08-29 (×2): qty 1

## 2023-08-29 MED ORDER — ACETAMINOPHEN 1,000 MG/100 ML (10 MG/ML) INTRAVENOUS SOLUTION
1000.0000 mg | Freq: Four times a day (QID) | INTRAVENOUS | Status: DC | PRN
Start: 2023-08-29 — End: 2023-08-30
  Administered 2023-08-29: 0 mg via INTRAVENOUS
  Administered 2023-08-29: 1000 mg via INTRAVENOUS
  Filled 2023-08-29 (×2): qty 100

## 2023-08-29 MED ORDER — MORPHINE 4 MG/ML INTRAVENOUS SOLUTION
4.0000 mg | INTRAVENOUS | Status: DC | PRN
Start: 2023-08-29 — End: 2023-08-29
  Administered 2023-08-29 (×2): 4 mg via INTRAVENOUS
  Filled 2023-08-29 (×2): qty 1

## 2023-08-29 MED ORDER — GLYCOPYRROLATE 0.2 MG/ML INJECTION SOLUTION
200.0000 ug | Freq: Four times a day (QID) | INTRAMUSCULAR | Status: DC
Start: 2023-08-29 — End: 2023-08-30
  Administered 2023-08-29: 200 ug via INTRAVENOUS
  Filled 2023-08-29: qty 1

## 2023-08-29 MED ORDER — MORPHINE 2 MG/ML INTRAVENOUS SYRINGE
2.0000 mg | INJECTION | INTRAVENOUS | Status: DC | PRN
Start: 2023-08-29 — End: 2023-08-29

## 2023-08-29 MED ORDER — MORPHINE 4 MG/ML INTRAVENOUS SOLUTION
3.0000 mg | INTRAVENOUS | Status: DC | PRN
Start: 2023-08-29 — End: 2023-08-29

## 2023-08-29 MED ORDER — MORPHINE 4 MG/ML INTRAVENOUS SOLUTION
3.0000 mg | INTRAVENOUS | Status: DC | PRN
Start: 2023-08-29 — End: 2023-08-29
  Administered 2023-08-29 (×3): 3 mg via INTRAVENOUS
  Filled 2023-08-29 (×3): qty 1

## 2023-08-29 MED ORDER — MORPHINE 4 MG/ML INTRAVENOUS SOLUTION
3.0000 mg | INTRAVENOUS | Status: DC | PRN
Start: 2023-08-29 — End: 2023-08-29
  Administered 2023-08-29: 3 mg via INTRAVENOUS
  Filled 2023-08-29: qty 1

## 2023-09-01 LAB — CROSSMATCH RED CELLS - UNITS
UNIT DIVISION: 0
UNIT DIVISION: 0
UNIT DIVISION: 0
UNIT DIVISION: 0
UNIT DIVISION: 0
UNIT DIVISION: 0
UNIT DIVISION: 0

## 2023-09-01 LAB — TYPE AND SCREEN
ABO/RH(D): A POS
ANTIBODY SCREEN: NEGATIVE
UNITS ORDERED: 4

## 2023-09-10 NOTE — Nurses Notes (Signed)
 Service contacted "[address]. Needs something like a continuous drip for pain management as a CMO patient 2mg  of morphine  didn't not make her comfortable for a whole hour and neither will 3mg  of morphine . She is still crying in pain"

## 2023-09-10 NOTE — Progress Notes (Signed)
 [facility]                                                                                           Trauma Progress Note                 Date of Birth:  04-22-1925  Date of Admission:  08/28/2023  Date of service: 09/25/23    Tricia Bailey, 89 y.o., female Post trauma day 2 status post GLF.     Hospital Course:   3/19: admitted to SICU for hemorrhagic shock secondary to pelvic fxs , CTA chest A/P showing active bleeding in right pelvis, GOC discussion with MPOA--> CMO,  supportive care consulted, transferred to 7w  09-25-2023: Morphine  PCA ordered, PRN morphine  and ativan  increased     Events over the last 24 hours have included:  As above.    Subjective:    Per nurses this morning, patient's pain was not controlled. IV tylenol  added overnight. Family very upset this morning. PCA morphine  added this morning and ativan  increased. PRN morphine  increased to 4mg . Patient hypotensive in 50s/20s.     Objective   24 Hour Summary:    Filed Vitals:    08/28/23 1721 08/28/23 2106 08/28/23 2344 09-25-23 0620   BP: (!) 84/43 (!) 59/30 (!) 62/31 (!) 59/24   Pulse: 55 69 71 77   Resp: (!) 26   14   Temp: 36.3 C (97.3 F)   36.9 C (98.4 F)   SpO2: (!) 83% 90%       Labs:  Recent Labs     08/28/23  0910 08/28/23  1307   WBC 9.3  --    HGB 10.2* 7.1*   HCT 33.0* 22.7*   SODIUM 136  134*  --    POTASSIUM 4.4  --    CHLORIDE 106  105  --    BICARBONATE 25.1  --    BUN 19  --    CREATININE 0.82  --    GLUCOSE 127*  124  --    ANIONGAP 7  --    CALCIUM 8.3*  --    INR 1.08  --      In: 596.67 [I.V.:510]  Out: 50 [Urine:50]  Nutrition Management: DIET NPO - NOW STRICT (KEEP NPO UNTIL BEDSIDE ASPIRATION SCREEN COMPLETED)  MNT PROTOCOL FOR DIETITIAN Date of Last Bowel Movement:  (PTA)  No results for input(s): "ALBUMIN", "PREALBUMIN" in the last 72 hours.  Current Medications:  acetaminophen  (OFIRMEV ) 1,000 mg (10 mg/mL) IV 100 mL (tot vol), 1,000 mg, Intravenous, Q6H PRN  D5W 250 mL flush bag, , Intravenous, Q15 Min  PRN  glycopyrrolate  (ROBINUL ) 0.2 mg/mL (200 mcg/mL) injection, 200 mcg, Intravenous, Q6H PRN  ipratropium-albuterol  0.5 mg-3 mg(2.5 mg base)/3 mL Solution for Nebulization, 3 mL, Nebulization, Q4H PRN  levothyroxine  (SYNTHROID) tablet, 75 mcg, Oral, QAM  lidocaine -menthol  (LIDOPATCH) 4%-1% patch, 1 Patch, Transdermal, Daily  LORazepam  (ATIVAN ) 2 mg/mL injection, 1 mg, Intravenous, Q4H PRN  morphine  (PF) 1 mg/mL in NS PCA, , Intravenous, Continuous  morphine  4 mg/mL injection, 4 mg, Intravenous, Q30 Min PRN  NS 250  mL flush bag, , Intravenous, Q15 Min PRN  NS bolus infusion 40 mL, 40 mL, Intravenous, Once  NS flush syringe, 2-6 mL, Intracatheter, Q8HRS  NS flush syringe, 2-6 mL, Intracatheter, Q1 MIN PRN  ondansetron  (ZOFRAN ) 2 mg/mL injection, 4 mg, Intravenous, Q8H PRN  pantoprazole  (PROTONIX ) delayed release tablet, 40 mg, Oral, Daily  promethazine  (PHENERGAN ) tablet, 25 mg, Oral, Q6H PRN      Today's Physical Exam:  GEN:   NAD  PULM:  Normal respiratory effort.    CV: WWP  Ms-moves all extremities   Integumentary:  Pink, warm, and dry    Assessment/ Plan:   Active Hospital Problems   (*Primary Problem)    Diagnosis    *Trauma       Anticoagulants (last 24 hours)       None          Pain:   Analgesics (last 24 hours)       Date/Time Action Medication Dose Rate    Sep 21, 2023 0832 Given    morphine  4 mg/mL injection 4 mg     09-21-23 0701 Given    morphine  4 mg/mL injection 3 mg     2023/09/21 5784 Given    morphine  4 mg/mL injection 3 mg     09/21/2023 0532 Given    morphine  4 mg/mL injection 3 mg     09-21-2023 0509 Given    morphine  4 mg/mL injection 3 mg     21-Sep-2023 0433 Given    morphine  2 mg/mL injection 2 mg     21-Sep-2023 0331 Given    morphine  2 mg/mL injection 2 mg     21-Sep-2023 0258 New Bag/New Syringe    acetaminophen  (OFIRMEV ) 1,000 mg (10 mg/mL) IV 100 mL (tot vol) 1,000 mg 400 mL/hr    09-21-2023 0200 Given    morphine  2 mg/mL injection 2 mg     08/28/23 2243 Given    morphine  2 mg/mL injection 2 mg     08/28/23  2051 Given    morphine  2 mg/mL injection 2 mg     08/28/23 1944 Given    morphine  2 mg/mL injection 2 mg     08/28/23 1800 Given    morphine  2 mg/mL injection 2 mg     08/28/23 1605 Given    HYDROmorphone  (DILAUDID ) 0.5 mg/0.5 mL injection 0.2 mg     08/28/23 1325 Given    HYDROmorphone  (DILAUDID ) 0.5 mg/0.5 mL injection 0.2 mg               Plan:       Hemorrhagic shock secondary to pelvic fxs with active bleed from right pelvic hematoma   -trauma service spoke with MPOA for GOC discussion on 3/19:              - DNR/DNI              - Comfort Measures Only              - No surgical or procedural intervention               - No Blood Transfusions              - Treatment Limitations: No escalation in care, no vasopressors, no laboratory testing, no additional diagnostic imaging, no BiPAP/HFNC, No tube feedings              - Transfer to Floor (7w)   -Supportive care consulted for Symptom Management                           -  Opioids as need for pain and dyspnea                           - Ativan  for agitation                           - Supportive care consultation for symptom management:                                   -Morphine  increased to 4 mg IV Q30 min PRN for pain/SOB this morning                                  -Lorazepam  increased to 1 mg IV Q 4 hours PRN for agitation                                 -Robinul  200 mcg IV Q 6 hours PRN for excess secretions                                 -IV tylenol  1000 mg Q 6hrns PRN for pain                                  -Morphine  PCA ordered this morning       left acetabulum fracture, left pubic ramus fracture, R pelvic hematoma with active bleed  Acute right posterior 11th rib fracture   -CTA chest A/p with active bleed in right pelvis. IR paged  -Since patient made CMO--> continue Pain control        Diet: Comfort feeds   DVT ppx: discontinued  Dispo: Hospice       Veryl Gottron, PA-C 09/28/2023, 08:40    Trauma/Surgical Critical Care/Acute Care Surgery  Staff  I personally saw and examined the patient. See APP note for additional details. My findings are noted below.  The date of the signature is simply the date the note was electronically signed, the patient was seen on the date of service indicated in the note.          Electronically Signed by:    Joen Muskrat, MD, FACS  Professor of Surgery  Trauma, Surgical Critical Care, and Acute Care Surgery  [place]    2023/09/28, 09:40

## 2023-09-10 NOTE — Nurses Notes (Addendum)
 Service notified - "[address] The patient is still not comfortable and still in distress. 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

## 2023-09-10 NOTE — Care Plan (Signed)
Medical Nutrition Therapy  I: Per MD notes, pt is now Comfort Measures Only.  P: Will sign off at this time.  Please consult if further nutrition intervention is warranted.     Demarcus Thielke, RDLD  Ext. [phone]  Pager #[phone]

## 2023-09-10 NOTE — Nurses Notes (Addendum)
 Service contacted - "[address] needs something else for pain management. She is crying and screaming in pain, she's has all her PRNS. Family is upset."    0234 Service contacted again "patient cannot swallow she's strict NPO right now"    [phone] Service contacted - "[address], she may benefit from a drip to keep her comfortable if possible, thanks"    [phone]- [phone], "Is her morphine  order being increased? Or can it at least be put back in its currently no longer active"

## 2023-09-10 NOTE — Care Plan (Signed)
 Problem: Adult Inpatient Plan of Care  Goal: Plan of Care Review  Outcome: Ongoing (see interventions/notes)  Goal: Patient-Specific Goal (Individualized)  Outcome: Ongoing (see interventions/notes)  Flowsheets (Taken 08/28/2023 2000)  Individualized Care Needs: pain mang  Anxieties, Fears or Concerns: pani control  Goal: Absence of Hospital-Acquired Illness or Injury  Outcome: Ongoing (see interventions/notes)  Intervention: Identify and Manage Fall Risk  Recent Flowsheet Documentation  Taken 09-19-2023 0600 by Ebbie Goldmann, RN  Safety Promotion/Fall Prevention: safety round/check completed  Taken 09-19-23 0200 by Ebbie Goldmann, RN  Safety Promotion/Fall Prevention: safety round/check completed  Taken 19-Sep-2023 0000 by Ebbie Goldmann, RN  Safety Promotion/Fall Prevention: safety round/check completed  Taken 08/28/2023 2200 by Ebbie Goldmann, RN  Safety Promotion/Fall Prevention: safety round/check completed  Taken 08/28/2023 2000 by Ebbie Goldmann, RN  Safety Promotion/Fall Prevention: safety round/check completed  Intervention: Prevent Skin Injury  Recent Flowsheet Documentation  Taken 08/28/2023 2300 by Ebbie Goldmann, RN  Skin Protection:   pectin skin barriers applied   adhesive use limited  Taken 08/28/2023 2200 by Ebbie Goldmann, RN  Body Position: side lying, right  Taken 08/28/2023 2000 by Ebbie Goldmann, RN  Body Position: side lying, right  Skin Protection: incontinence pads utilized  Intervention: Prevent and Manage VTE (Venous Thromboembolism) Risk  Recent Flowsheet Documentation  Taken 09/19/23 0009 by Ebbie Goldmann, RN  VTE Prevention/Management: (CMO) other (see comments)  Goal: Optimal Comfort and Wellbeing  Outcome: Ongoing (see interventions/notes)  Intervention: Provide Person-Centered Care  Recent Flowsheet Documentation  Taken 08/28/2023 2000 by Ebbie Goldmann, RN  Trust Relationship/Rapport: emotional support provided  Goal: Rounds/Family Conference  Outcome: Ongoing (see interventions/notes)     Problem: Skin Injury Risk  Increased  Goal: Skin Health and Integrity  Outcome: Ongoing (see interventions/notes)  Intervention: Optimize Skin Protection  Recent Flowsheet Documentation  Taken 08/28/2023 2300 by Ebbie Goldmann, RN  Pressure Reduction Techniques: Moisture, shear and nutrition are maximized  Pressure Reduction Devices: Repositioning wedges/pillows utilized  Skin Protection:   pectin skin barriers applied   adhesive use limited  Taken 08/28/2023 2000 by Ebbie Goldmann, RN  Pressure Reduction Techniques:   Patient turned q 2 hours   Frequent weight shifting encouraged  Pressure Reduction Devices: Repositioning wedges/pillows utilized  Skin Protection: incontinence pads utilized  Head of Bed (HOB) Positioning: HOB at 20-30 degrees    Plan of care reviewed. Pt AOx4 and cooperative with care this shift. Pain managed with meds per MAR. Assessment per doc flow. Pt free from falls this shift. Needs met with hourly rounding. Pt resting at this time with call bell in reach. Wheels locked and bed in lowest position. Will continue to monitor.

## 2023-09-10 NOTE — Nurses Notes (Addendum)
 0981 pt grimacing, labored breathing, groaning, Ativan  1mg  and Morphine  4mg  given PRN    0837 Morphine  PCA initiated at a rate of 4mg /hr    0934 pt grimacing, Morphine  4mg  administered PRN    1146 pt frowning, appears comfortable, no PRN medications administered at this time    1346 pt frowning, appears comfortable, no PRN medications administered at this time    1526 pt found to have removed both of her PIVs from her arms, agitated, groaning, grimacing, tense, Ativan  1mg  PRN attempted administration but canceled entry and medication wasted due to no IV access    1602 IV access reobtained, pt moaning and frowning, Morphine  PCA restarted, Ativan  1mg  PRN administered    1657 Morphine  PCA rate changed per service from 4mg /hr to 6mg /hr, service also added scheduled medications to pt's MAR    1758 pt appears comfortable, no grimacing or groaning, scheduled Robinul  and Ativan  2mg  administered    1921 Morphine  PCA dual sign off obtained per dayshift and nightshift RN at shift change, confirmed rate 6mg /hr, pt appears comfortable, no groaning or grimacing

## 2023-09-10 NOTE — Nurses Notes (Signed)
 Patient still has not been ordered a drip or any further pain management. Patient is still in pain and crying out. Service has been contacted multiple times. Family is very upset that the patient isn't comfortable after provider assured her that she would be if they made her CMO.

## 2023-09-10 NOTE — Progress Notes (Signed)
 Trauma Emergency Surgery    Called to see Tricia Bailey regarding death exam.  Patient found to have no visible or audible evidence of spontaneous respiratory effort.  An endotracheal/tracheostomy tube was not present at the time of death.  No electrical, palpable, or audible evidence of spontaneous cardiac activity was present.  No brainstem or peripheral reflexes were present.  Patient is pronounced dead at 10:52 PM hours on 22-Sep-2023.  Medical devices present at time of death were left in place pending assessement by the medical examiner.     Medical Examiner Notification yes  Autopsy per family: no  CORE notified/present: yes  Family present: yes, notified: yes    Horton Maas, MD (PGY-1) 2023-09-22 23:59  [facility]

## 2023-09-10 NOTE — Care Plan (Signed)
 Problem: Adult Inpatient Plan of Care  Goal: Plan of Care Review  Outcome: Ongoing (see interventions/notes)  Goal: Patient-Specific Goal (Individualized)  Outcome: Ongoing (see interventions/notes)  Goal: Absence of Hospital-Acquired Illness or Injury  Outcome: Ongoing (see interventions/notes)  Intervention: Identify and Manage Fall Risk  Recent Flowsheet Documentation  Taken September 23, 2023 1800 by Laird Pih, RN  Safety Promotion/Fall Prevention: safety round/check completed  Taken 09-23-23 1600 by Laird Pih, RN  Safety Promotion/Fall Prevention: safety round/check completed  Taken 23-Sep-2023 1400 by Laird Pih, RN  Safety Promotion/Fall Prevention: safety round/check completed  Taken 09-23-2023 1200 by Laird Pih, RN  Safety Promotion/Fall Prevention: safety round/check completed  Taken 09-23-2023 1000 by Laird Pih, RN  Safety Promotion/Fall Prevention: safety round/check completed  Taken September 23, 2023 0800 by Laird Pih, RN  Safety Promotion/Fall Prevention: safety round/check completed  Intervention: Prevent Skin Injury  Recent Flowsheet Documentation  Taken 09-23-23 1800 by Laird Pih, RN  Body Position: supine, head elevated  Taken 09-23-23 1400 by Laird Pih, RN  Body Position: supine, head elevated  Taken 09-23-23 1200 by Laird Pih, RN  Body Position: side lying, right  Taken September 23, 2023 1100 by Laird Pih, RN  Skin Protection:   adhesive use limited   incontinence pads utilized   transparent dressing maintained   tubing/devices free from skin contact  Taken 2023-09-23 1000 by Laird Pih, RN  Body Position: side lying, right  Goal: Optimal Comfort and Wellbeing  Outcome: Ongoing (see interventions/notes)  Goal: Rounds/Family Conference  Outcome: Ongoing (see interventions/notes)     Problem: Skin Injury Risk Increased  Goal: Skin Health and Integrity  Outcome: Ongoing (see interventions/notes)  Intervention: Optimize Skin Protection  Recent Flowsheet Documentation  Taken 2023/09/23 1100 by Laird Pih, RN  Pressure Reduction  Techniques: Patient turned q 2 hours  Pressure Reduction Devices: Repositioning wedges/pillows utilized  Skin Protection:   adhesive use limited   incontinence pads utilized   transparent dressing maintained   tubing/devices free from skin contact     Plan of care reviewed with family at bedside. Pt cooperative with care this shift. Pain managed with meds per MAR. Assessment per doc flow. Pt free from falls this shift. Needs met with hourly rounding. Pt resting at this time with call bell in reach. Wheels locked and bed in lowest position. Will continue to monitor.

## 2023-09-10 NOTE — Consults (Signed)
 [facility]  Supportive/Palliative Care Medicine Consult  Follow Up Note    Tricia, Bailey, 89 y.o. female  Date of Service: 2023/09/16  Date of Birth:  1924/08/01    Hospital Day:  LOS: 1 day     Assessment/Recommendations:   Palliative medicine consult secondary to failure to thrive in the setting of fall with multiple fractures and hemorrhagic shock.     Patient is confused and complaining about pain and general discomfort. She is unable to be more specific about pain location and multiple morphine  PRNs used in the last 12 hours.     PLAN:  -CMO  -Start morphine  PCA 4 mg/hr and 4 mg IV Q 30 minutes PRN for pain and SOB  -Change lorazepam  1 mg IV Q 4 hours for agitation  -Continue Robinul  200 mcg IV Q 6 hours PRN for excess secretions  -Continue Tylenol  650 mg PR Q 6 hours per fever  -All the above discussed with the primary team     Subjective: Patient is on distress and complains about pain. Confused and unable to elaborate further. Family members at the bedside and updated about the medical plan.     Objective:  Temperature: 36.9 C (98.4 F)  Heart Rate: 77  BP (Non-Invasive): (!) 59/24  Respiratory Rate: 14  SpO2: 90 %  General: In acute distress  HEENT: Normocephalic. Closed eyes, mucous membranes moist.  NECK:   Non-tender to palpation  PULM:   Breathing without using accessory muscles  CV:   Regular rate and rhythm  NEURO: Confused and unable to follow commands.     Labs:  I have reviewed all lab results.    Imaging Studies:  Images and Reports reviewed to current date.    Consultant:  Lorena Benham, MD  Total Time of Encounter:  30 minutes      Tricia Lentsch, MD

## 2023-09-10 NOTE — Progress Notes (Signed)
 [facility]  General Inpatient Hospice Progress Note      Tempestt, Silba, 89 y.o. female  Date of Service: 01-Sep-2023  Date of Birth:  [birth year]    Hospital Day:  LOS: 1 day     Patient still moaning when changing positions and terminal delirium. In addition, labored breathing  Increase Dilaudid  PCA 6 mg/hr and 6 mg Q 30 minutes PRN for pain and SOB  Increase Lorazepam  2 mg IV Q 4 hours and 2 mg IV Q 2 hours PRN for terminal delirium  Increase Robinul  200 mcg IV Q 6 hours.    Asaph Serena, MD

## 2023-09-10 NOTE — Ancillary Notes (Signed)
 SBIRT    SBIRT not completed during hospitalization because patient is not capable of participating in assessments due to patient orders of CMO. Pending recommendations:  None.      Derk Fleming, LICSW , Sep 27, 2023, 08:31      Pager  [phone]

## 2023-09-10 NOTE — Expiration Summary (Addendum)
 EXPIRATION SUMMARY        PATIENT NAME:  Tricia Bailey, Tricia Bailey   MRN:  [id number]  DOB:  08-Jul-1924    ADMISSION DATE:  08/28/2023  EXPIRATION DATE:  2023-09-05    PRIMARY CARE PHYSICIAN: No Pcp     ADMISSION DIAGNOSIS:  Trauma [T14.90XA]    FINAL DIAGNOSIS:  Failure to thrive     Principle Problem:  Trauma    Active Hospital Problems    Diagnosis Date Noted    Principal Problem: Trauma [T14.90XA] 08/28/2023      Resolved Hospital Problems   No resolved problems to display.     Active Non-Hospital Problems    Diagnosis Date Noted    Syncope and collapse 08/04/2022    Acute upper GI bleed 03/29/2022    Vertigo 11/08/2021    GERD (gastroesophageal reflux disease)     HTN (hypertension)         Code status prior to expiration:  DNAR/ DNI    REASON FOR HOSPITALIZATION AND HOSPITAL COURSE:    Tricia Bailey is a 89 y.o. female with a PMH of COPD, HTN, GERD, hypothyroidism who was brought in on 08/28/23 as a P2 trauma transfer from [facility] after a Fall at home.    She was found to have a left acetabulum fracture, left pubic ramus fracture, R pelvic hematoma c/f active bleed and was thus admitted to the SICU for critical care monitoring.     Patient was found to be in hemorrhagic chock form her injuries and was resuscitated in the SICU.  Family was notified of her injuries and the plan of care was discussed and agreed upon  Treatment team was in touch with the family to discuss the plan of care and to update them about all the changes in patient's clinical status. Family expressed opted for a DNAR/ DNI code status, and they decided that they would like their loved one to be placed on Comfort Measures Only.  The palliative team was brought on board and they discussed and implemented treatment options to keep patient comfortable by managing her pain.    This evening at 10:15 PM patient became unarousable/ unconscious. Family was present the entire time  in patient's room. Service was paged to come and evaluate patient and after an extensive evaluation patient was pronounced dead at 10:52 PM.        cc: Primary Care Physician:  No Pcp  No address on file    [id number] Physician:  Mansoor Walt Gunner, MD  [address] [place],  [place] [address]    Angela Balagadde, MD (PGY-1)  [facility] General Surgery

## 2023-09-10 NOTE — Nurses Notes (Signed)
 Paged service for the following:    701, [place], called to bedside by family. Pt had stopped breathing. Called second nurse to confirm and after 2 mins auscultated no heart or lung sounds. Thank you, [phone]

## 2023-09-10 DEATH — deceased
